# Patient Record
Sex: Male | Born: 1940
Health system: Southern US, Community
[De-identification: ages and names within clinical notes are randomized; demographics above are authoritative.]

## PROBLEM LIST (undated history)

## (undated) DIAGNOSIS — G5600 Carpal tunnel syndrome, unspecified upper limb: Secondary | ICD-10-CM

## (undated) DIAGNOSIS — J45909 Unspecified asthma, uncomplicated: Secondary | ICD-10-CM

## (undated) DIAGNOSIS — M1990 Unspecified osteoarthritis, unspecified site: Secondary | ICD-10-CM

## (undated) DIAGNOSIS — R413 Other amnesia: Secondary | ICD-10-CM

## (undated) DIAGNOSIS — K5792 Diverticulitis of intestine, part unspecified, without perforation or abscess without bleeding: Secondary | ICD-10-CM

## (undated) DIAGNOSIS — K219 Gastro-esophageal reflux disease without esophagitis: Secondary | ICD-10-CM

## (undated) DIAGNOSIS — E7800 Pure hypercholesterolemia, unspecified: Secondary | ICD-10-CM

## (undated) DIAGNOSIS — H269 Unspecified cataract: Secondary | ICD-10-CM

## (undated) HISTORY — DX: Gastro-esophageal reflux disease without esophagitis: K21.9

## (undated) HISTORY — DX: Diverticulitis of intestine, part unspecified, without perforation or abscess without bleeding: K57.92

## (undated) HISTORY — PX: EYE SURGERY: SHX253

## (undated) HISTORY — DX: Pure hypercholesterolemia, unspecified: E78.00

## (undated) HISTORY — DX: Carpal tunnel syndrome, unspecified upper limb: G56.00

## (undated) HISTORY — PX: BILATERAL CARPAL TUNNEL RELEASE: SHX6508

## (undated) HISTORY — DX: Unspecified asthma, uncomplicated: J45.909

## (undated) HISTORY — PX: CATARACT EXTRACTION, BILATERAL: SHX1313

## (undated) HISTORY — DX: Unspecified cataract: H26.9

## (undated) HISTORY — DX: Other amnesia: R41.3

---

## 2000-02-06 ENCOUNTER — Encounter: Payer: Self-pay | Admitting: Ophthalmology

## 2000-02-07 ENCOUNTER — Ambulatory Visit (HOSPITAL_COMMUNITY): Admission: RE | Admit: 2000-02-07 | Discharge: 2000-02-08 | Payer: Self-pay | Admitting: Ophthalmology

## 2002-06-14 ENCOUNTER — Encounter: Payer: Self-pay | Admitting: Ophthalmology

## 2002-06-16 ENCOUNTER — Ambulatory Visit (HOSPITAL_COMMUNITY): Admission: RE | Admit: 2002-06-16 | Discharge: 2002-06-16 | Payer: Self-pay | Admitting: Ophthalmology

## 2013-03-25 ENCOUNTER — Ambulatory Visit (INDEPENDENT_AMBULATORY_CARE_PROVIDER_SITE_OTHER): Payer: BC Managed Care – PPO | Admitting: Family Medicine

## 2013-03-25 VITALS — BP 126/78 | HR 69 | Temp 98.0°F | Resp 16 | Ht 70.5 in | Wt 230.0 lb

## 2013-03-25 DIAGNOSIS — J309 Allergic rhinitis, unspecified: Secondary | ICD-10-CM

## 2013-03-25 MED ORDER — AMOXICILLIN 500 MG PO CAPS: 1000.0000 mg | ORAL_CAPSULE | Freq: Two times a day (BID) | ORAL | Status: DC

## 2013-03-25 MED ORDER — FLUTICASONE PROPIONATE 50 MCG/ACT NA SUSP: 2.0000 | Freq: Every day | NASAL | Status: DC

## 2013-03-25 MED ORDER — IPRATROPIUM BROMIDE 0.03 % NA SOLN: 2.0000 | Freq: Two times a day (BID) | NASAL | Status: DC

## 2013-03-25 NOTE — Patient Instructions (Addendum)
1.  Take Mucinex Cold & Sinus as recommended. 2.  Start Flonase and Atrovent nasal sprays as prescribed. 3. If no improvement in 3-5 days (Wednesday), start Amoxicillin.

## 2013-03-25 NOTE — Progress Notes (Signed)
[address]   [place], [place]  [address]   [phone]  Subjective:    Patient ID: Kenneth Harper, male    DOB: [birth year], 72 y.o.   MRN: [id number]  Sore Throat  Associated symptoms include congestion, coughing and trouble swallowing. Pertinent negatives include no diarrhea, ear pain, headaches, shortness of breath, stridor or vomiting.  Otalgia  Associated symptoms include coughing, rhinorrhea and a sore throat. Pertinent negatives include no diarrhea, headaches, rash or vomiting.   This 72 y.o. male presents for evaluation of sore throat, cough. Has chronic ear problems.  Onset three days ago.  Awoke with ST, cough, nasal congestion.  Took Mucinex, cough syrup and taking throat lozenges.  No fever/chills/sweats.  No headache.  No ear pain.  +ST and scratchy throat.  +ear congestion.  +congestion; +rhinorrhea clear.  Scant sneezing.  +PND.  No pain with swallowing.  +coughing sporadically; no SOB.  No sputum production.  No v/d.  PCP:  Dr. Maxie Better.   Review of Systems  Constitutional: Negative for fever, chills, diaphoresis and fatigue.  HENT: Positive for congestion, sore throat, rhinorrhea, trouble swallowing, voice change and postnasal drip. Negative for ear pain, sneezing and sinus pressure.   Respiratory: Positive for cough. Negative for shortness of breath, wheezing and stridor.   Gastrointestinal: Negative for nausea, vomiting and diarrhea.  Skin: Negative for rash.  Neurological: Negative for headaches.   Past Medical History  Diagnosis Date  . Asthma   . Cataract    Past Surgical History  Procedure Laterality Date  . Eye surgery     No Known Allergies No current outpatient prescriptions on file prior to visit.   No current facility-administered medications on file prior to visit.       Objective:   Physical Exam  Nursing note and vitals reviewed. Constitutional: He is oriented to person, place, and time. He appears well-developed and  well-nourished. No distress.  HENT:  Head: Normocephalic and atraumatic.  Right Ear: External ear normal.  Left Ear: External ear normal.  Nose: Nose normal.  Mouth/Throat: Posterior oropharyngeal erythema present. No oropharyngeal exudate or posterior oropharyngeal edema.  Eyes: Conjunctivae and EOM are normal. Pupils are equal, round, and reactive to light.  Neck: Normal range of motion. Neck supple.  Cardiovascular: Normal rate, regular rhythm, normal heart sounds and intact distal pulses.   Pulmonary/Chest: Effort normal and breath sounds normal. He has no wheezes. He has no rales.  Lymphadenopathy:    He has no cervical adenopathy.  Neurological: He is alert and oriented to person, place, and time.  Skin: Skin is warm and dry. No rash noted. He is not diaphoretic.  Psychiatric: He has a normal mood and affect. His behavior is normal.      Assessment & Plan:  Allergic rhinitis, cause unspecified - Plan: fluticasone (FLONASE) 50 MCG/ACT nasal spray, ipratropium (ATROVENT) 0.03 % nasal spray  1. Allergic Rhinitis: uncontrolled; rx for Flonase and Atrovent nasal sprays.  If no improvement in 3-5 days, start Amoxicillin.  Meds ordered this encounter  Medications  . atorvastatin (LIPITOR) 20 MG tablet    Sig: Take 20 mg by mouth daily.  . fluticasone (FLONASE) 50 MCG/ACT nasal spray    Sig: Place 2 sprays into the nose daily.    Dispense:  16 g    Refill:  6  . ipratropium (ATROVENT) 0.03 % nasal spray    Sig: Place 2 sprays into the nose 2 (two) times daily.    Dispense:  30  mL    Refill:  5  . amoxicillin (AMOXIL) 500 MG capsule    Sig: Take 2 capsules (1,000 mg total) by mouth 2 (two) times daily.    Dispense:  40 capsule    Refill:  0

## 2013-10-30 ENCOUNTER — Ambulatory Visit (INDEPENDENT_AMBULATORY_CARE_PROVIDER_SITE_OTHER): Payer: BC Managed Care – PPO | Admitting: Family Medicine

## 2013-10-30 VITALS — BP 128/80 | HR 88 | Temp 98.0°F | Resp 17 | Ht 68.5 in | Wt 230.0 lb

## 2013-10-30 DIAGNOSIS — J069 Acute upper respiratory infection, unspecified: Secondary | ICD-10-CM

## 2013-10-30 DIAGNOSIS — J309 Allergic rhinitis, unspecified: Secondary | ICD-10-CM

## 2013-10-30 MED ORDER — AMOXICILLIN 875 MG PO TABS: 875.0000 mg | ORAL_TABLET | Freq: Two times a day (BID) | ORAL | Status: DC

## 2013-10-30 MED ORDER — HYDROCOD POLST-CHLORPHEN POLST 10-8 MG/5ML PO LQCR: 5.0000 mL | Freq: Every evening | ORAL | Status: DC | PRN

## 2013-10-30 NOTE — Progress Notes (Signed)
   Subjective:    Patient ID: Kenneth Harper, male    DOB: 1941-06-12, 73 y.o.   MRN: [id number]  HPI Patient presents today with off and on symptoms of cough, sore throat for 6 weeks. Currently having a tickle in his throat, cough with yellow/green sputum. Has ear congestion, clear nasal drainage. Was seen 9/14 with similar symptoms and was started on flonase. He used it for awhile with good results and discontinued use.  Does not feel unwell, tired of persistent symptoms.   Review of Systems No fever, no headache, no chest pain, no SOB (had some this morning while carrying heavy trash bags to curb).  No nausea, no vomiting, no diarrhea.     Objective:   Physical Exam  Vitals reviewed. Constitutional: He is oriented to person, place, and time. He appears well-developed and well-nourished. No distress.  HENT:  Head: Normocephalic and atraumatic.  Right Ear: Tympanic membrane, external ear and ear canal normal.  Left Ear: Tympanic membrane, external ear and ear canal normal.  Nose: Mucosal edema and rhinorrhea present. Right sinus exhibits no maxillary sinus tenderness and no frontal sinus tenderness. Left sinus exhibits no maxillary sinus tenderness and no frontal sinus tenderness.  Mouth/Throat: Oropharyngeal exudate and posterior oropharyngeal erythema present. No posterior oropharyngeal edema.  Cardiovascular: Normal rate, regular rhythm and normal heart sounds.   Pulmonary/Chest: Effort normal and breath sounds normal.  Musculoskeletal: Normal range of motion.  Neurological: He is alert and oriented to person, place, and time.  Skin: Skin is warm and dry. He is not diaphoretic.  Psychiatric: He has a normal mood and affect. His behavior is normal. Judgment and thought content normal.      Assessment & Plan:  1. Acute upper respiratory infections of unspecified site -Suspect viral/allergic rhinitis, but with prolonged course, will cover with antibiotic - amoxicillin (AMOXIL) 875  MG tablet; Take 1 tablet (875 mg total) by mouth 2 (two) times daily.  Dispense: 20 tablet; Refill: 0 - chlorpheniramine-HYDROcodone (TUSSIONEX PENNKINETIC ER) 10-8 MG/5ML LQCR; Take 5 mLs by mouth at bedtime as needed for cough (cough).  Dispense: 70 mL; Refill: 0  2- Allergic Rhinitis -Provided written and verbal instructions including the following: Take non-sedating antihistamine every day while pollen heavy (Claritin, Zyrtec or Allegra- generic fine) Restart Flonase nasal spray, 2 sprays in each nostril every day- consider starting this again in the fall Take prescription cough syrup at bedtime as needed (ONLY 1 teaspoon) Can take Delsym over the counter cough syrup as needed during the day. Follow up if symptoms worsen, you run fever over 101 for more than 24 hours, you have chest pain, shortness of breath or no improvement in 3-5 days.  Elby Beck, FNP-BC  [facility]  10/30/2013 11:23 AM

## 2013-10-30 NOTE — Patient Instructions (Signed)
Take non-sedating antihistamine every day while pollen heavy (Claritin, Zyrtec or Allegra- generic fine) Restart Flonase nasal spray, 2 sprays in each nostril every day- consider starting this again in the fall Take prescription cough syrup at bedtime as needed (ONLY 1 teaspoon) Can take Delsym over the counter cough syrup as needed during the day. Follow up if symptoms worsen, you run fever over 101 for more than 24 hours, you have chest pain, shortness of breath or no improvement in 3-5 days.  Allergic Rhinitis Allergic rhinitis is when the mucous membranes in the nose respond to allergens. Allergens are particles in the air that cause your body to have an allergic reaction. This causes you to release allergic antibodies. Through a chain of events, these eventually cause you to release histamine into the blood stream. Although meant to protect the body, it is this release of histamine that causes your discomfort, such as frequent sneezing, congestion, and an itchy, runny nose.  CAUSES  Seasonal allergic rhinitis (hay fever) is caused by pollen allergens that may come from grasses, trees, and weeds. Year-round allergic rhinitis (perennial allergic rhinitis) is caused by allergens such as house dust mites, pet dander, and mold spores.  SYMPTOMS   Nasal stuffiness (congestion).  Itchy, runny nose with sneezing and tearing of the eyes. DIAGNOSIS  Your health care provider can help you determine the allergen or allergens that trigger your symptoms. If you and your health care provider are unable to determine the allergen, skin or blood testing may be used. TREATMENT  Allergic Rhinitis does not have a cure, but it can be controlled by:  Medicines and allergy shots (immunotherapy).  Avoiding the allergen. Hay fever may often be treated with antihistamines in pill or nasal spray forms. Antihistamines block the effects of histamine. There are over-the-counter medicines that may help with nasal  congestion and swelling around the eyes. Check with your health care provider before taking or giving this medicine.  If avoiding the allergen or the medicine prescribed do not work, there are many new medicines your health care provider can prescribe. Stronger medicine may be used if initial measures are ineffective. Desensitizing injections can be used if medicine and avoidance does not work. Desensitization is when a patient is given ongoing shots until the body becomes less sensitive to the allergen. Make sure you follow up with your health care provider if problems continue. HOME CARE INSTRUCTIONS It is not possible to completely avoid allergens, but you can reduce your symptoms by taking steps to limit your exposure to them. It helps to know exactly what you are allergic to so that you can avoid your specific triggers. SEEK MEDICAL CARE IF:   You have a fever.  You develop a cough that does not stop easily (persistent).  You have shortness of breath.  You start wheezing.  Symptoms interfere with normal daily activities. Document Released: 03/24/2001 Document Revised: 04/19/2013 Document Reviewed: 03/06/2013 [facility] Patient Information 2014 [place].

## 2013-10-31 NOTE — Progress Notes (Signed)
I have discussed this case with Ms. Gessner, NP and agree.  

## 2014-03-21 ENCOUNTER — Ambulatory Visit: Payer: BC Managed Care – PPO | Attending: Orthopaedic Surgery

## 2014-03-21 DIAGNOSIS — R5381 Other malaise: Secondary | ICD-10-CM | POA: Insufficient documentation

## 2014-03-21 DIAGNOSIS — IMO0001 Reserved for inherently not codable concepts without codable children: Secondary | ICD-10-CM | POA: Diagnosis present

## 2014-03-21 DIAGNOSIS — M545 Low back pain, unspecified: Secondary | ICD-10-CM | POA: Diagnosis not present

## 2014-03-21 DIAGNOSIS — M25569 Pain in unspecified knee: Secondary | ICD-10-CM | POA: Insufficient documentation

## 2014-03-21 DIAGNOSIS — M1710 Unilateral primary osteoarthritis, unspecified knee: Secondary | ICD-10-CM | POA: Diagnosis not present

## 2014-03-23 ENCOUNTER — Ambulatory Visit: Payer: BC Managed Care – PPO | Admitting: Physical Therapy

## 2014-03-23 DIAGNOSIS — IMO0001 Reserved for inherently not codable concepts without codable children: Secondary | ICD-10-CM | POA: Diagnosis not present

## 2014-03-27 ENCOUNTER — Ambulatory Visit: Payer: BC Managed Care – PPO | Admitting: Physical Therapy

## 2014-03-30 ENCOUNTER — Ambulatory Visit: Payer: BC Managed Care – PPO | Admitting: Physical Therapy

## 2014-04-02 ENCOUNTER — Ambulatory Visit: Payer: BC Managed Care – PPO | Admitting: Physical Therapy

## 2014-04-02 DIAGNOSIS — IMO0001 Reserved for inherently not codable concepts without codable children: Secondary | ICD-10-CM | POA: Diagnosis not present

## 2014-04-06 ENCOUNTER — Ambulatory Visit: Payer: BC Managed Care – PPO | Admitting: Physical Therapy

## 2014-04-06 DIAGNOSIS — IMO0001 Reserved for inherently not codable concepts without codable children: Secondary | ICD-10-CM | POA: Diagnosis not present

## 2014-04-09 ENCOUNTER — Encounter: Payer: BC Managed Care – PPO | Admitting: Physical Therapy

## 2014-04-13 ENCOUNTER — Ambulatory Visit: Payer: BC Managed Care – PPO | Attending: Orthopaedic Surgery | Admitting: Physical Therapy

## 2014-04-13 DIAGNOSIS — M25562 Pain in left knee: Secondary | ICD-10-CM | POA: Diagnosis not present

## 2014-04-13 DIAGNOSIS — Z5189 Encounter for other specified aftercare: Secondary | ICD-10-CM | POA: Diagnosis present

## 2014-04-13 DIAGNOSIS — R5381 Other malaise: Secondary | ICD-10-CM | POA: Insufficient documentation

## 2014-04-13 DIAGNOSIS — M25561 Pain in right knee: Secondary | ICD-10-CM | POA: Diagnosis not present

## 2014-04-13 DIAGNOSIS — M545 Low back pain: Secondary | ICD-10-CM | POA: Diagnosis not present

## 2014-04-13 DIAGNOSIS — M172 Bilateral post-traumatic osteoarthritis of knee: Secondary | ICD-10-CM | POA: Diagnosis not present

## 2014-04-20 ENCOUNTER — Ambulatory Visit: Payer: BC Managed Care – PPO | Admitting: Physical Therapy

## 2014-04-25 ENCOUNTER — Ambulatory Visit: Payer: BC Managed Care – PPO

## 2014-04-25 DIAGNOSIS — Z5189 Encounter for other specified aftercare: Secondary | ICD-10-CM | POA: Diagnosis not present

## 2014-06-21 ENCOUNTER — Telehealth: Payer: Self-pay | Admitting: Neurology

## 2014-06-21 NOTE — Telephone Encounter (Signed)
Dr. Melvenia Needles provider was notified of pt r/s his NP appt from 06/26/14 to 08/07/14.

## 2014-06-26 ENCOUNTER — Ambulatory Visit: Payer: BC Managed Care – PPO | Admitting: Neurology

## 2014-08-01 ENCOUNTER — Telehealth: Payer: Self-pay | Admitting: Neurology

## 2014-08-01 NOTE — Telephone Encounter (Signed)
Pt resch new patient appt from 08-07-14 to 09-12-14

## 2014-08-07 ENCOUNTER — Ambulatory Visit: Payer: BC Managed Care – PPO | Admitting: Neurology

## 2014-08-09 ENCOUNTER — Other Ambulatory Visit: Payer: Self-pay | Admitting: Gastroenterology

## 2014-09-06 ENCOUNTER — Encounter: Payer: Self-pay | Admitting: *Deleted

## 2014-09-12 ENCOUNTER — Ambulatory Visit: Payer: Self-pay | Admitting: Neurology

## 2014-10-22 ENCOUNTER — Encounter (HOSPITAL_COMMUNITY): Payer: Self-pay | Admitting: *Deleted

## 2014-10-23 ENCOUNTER — Ambulatory Visit: Payer: Self-pay | Admitting: Neurology

## 2014-10-29 ENCOUNTER — Ambulatory Visit (HOSPITAL_COMMUNITY)
Admission: RE | Admit: 2014-10-29 | Discharge: 2014-10-29 | Disposition: A | Discharge status: Home / Self Care | Payer: BLUE CROSS/BLUE SHIELD | Source: Ambulatory Visit | Attending: Gastroenterology | Admitting: Gastroenterology

## 2014-10-29 ENCOUNTER — Ambulatory Visit (HOSPITAL_COMMUNITY): Payer: BLUE CROSS/BLUE SHIELD | Admitting: Certified Registered Nurse Anesthetist

## 2014-10-29 ENCOUNTER — Encounter (HOSPITAL_COMMUNITY)
Admission: RE | Disposition: A | Discharge status: Home / Self Care | Payer: Self-pay | Source: Ambulatory Visit | Attending: Gastroenterology

## 2014-10-29 ENCOUNTER — Encounter (HOSPITAL_COMMUNITY): Payer: Self-pay | Admitting: Gastroenterology

## 2014-10-29 DIAGNOSIS — Z7982 Long term (current) use of aspirin: Secondary | ICD-10-CM | POA: Insufficient documentation

## 2014-10-29 DIAGNOSIS — Z1211 Encounter for screening for malignant neoplasm of colon: Secondary | ICD-10-CM | POA: Diagnosis not present

## 2014-10-29 DIAGNOSIS — Z87891 Personal history of nicotine dependence: Secondary | ICD-10-CM | POA: Insufficient documentation

## 2014-10-29 DIAGNOSIS — K5730 Diverticulosis of large intestine without perforation or abscess without bleeding: Secondary | ICD-10-CM | POA: Insufficient documentation

## 2014-10-29 DIAGNOSIS — J45909 Unspecified asthma, uncomplicated: Secondary | ICD-10-CM | POA: Insufficient documentation

## 2014-10-29 DIAGNOSIS — K621 Rectal polyp: Secondary | ICD-10-CM | POA: Insufficient documentation

## 2014-10-29 DIAGNOSIS — M179 Osteoarthritis of knee, unspecified: Secondary | ICD-10-CM | POA: Diagnosis not present

## 2014-10-29 DIAGNOSIS — D120 Benign neoplasm of cecum: Secondary | ICD-10-CM | POA: Insufficient documentation

## 2014-10-29 DIAGNOSIS — K219 Gastro-esophageal reflux disease without esophagitis: Secondary | ICD-10-CM | POA: Diagnosis not present

## 2014-10-29 DIAGNOSIS — E780 Pure hypercholesterolemia: Secondary | ICD-10-CM | POA: Diagnosis not present

## 2014-10-29 DIAGNOSIS — R413 Other amnesia: Secondary | ICD-10-CM | POA: Insufficient documentation

## 2014-10-29 HISTORY — PX: COLONOSCOPY WITH PROPOFOL: SHX5780

## 2014-10-29 SURGERY — COLONOSCOPY WITH PROPOFOL
Anesthesia: Monitor Anesthesia Care

## 2014-10-29 MED ORDER — LACTATED RINGERS IV SOLN
INTRAVENOUS | Status: DC
  Administered 2014-10-29: 1000 mL via INTRAVENOUS

## 2014-10-29 MED ORDER — LIDOCAINE HCL (CARDIAC) 20 MG/ML IV SOLN
INTRAVENOUS | Status: AC
  Filled 2014-10-29: qty 5

## 2014-10-29 MED ORDER — ONDANSETRON HCL 4 MG/2ML IJ SOLN
INTRAMUSCULAR | Status: DC | PRN
  Administered 2014-10-29: 4 mg via INTRAVENOUS

## 2014-10-29 MED ORDER — LIDOCAINE HCL (CARDIAC) 20 MG/ML IV SOLN
INTRAVENOUS | Status: DC | PRN
  Administered 2014-10-29: 50 mg via INTRAVENOUS

## 2014-10-29 MED ORDER — ONDANSETRON HCL 4 MG/2ML IJ SOLN
INTRAMUSCULAR | Status: AC
  Filled 2014-10-29: qty 2

## 2014-10-29 MED ORDER — PROPOFOL 10 MG/ML IV BOLUS
INTRAVENOUS | Status: AC
  Filled 2014-10-29: qty 20

## 2014-10-29 MED ORDER — PROPOFOL INFUSION 10 MG/ML OPTIME
INTRAVENOUS | Status: DC | PRN
  Administered 2014-10-29: 160 ug/kg/min via INTRAVENOUS

## 2014-10-29 MED ORDER — PROPOFOL 10 MG/ML IV BOLUS
INTRAVENOUS | Status: DC | PRN
  Administered 2014-10-29 (×2): 20 mg via INTRAVENOUS

## 2014-10-29 MED ORDER — SODIUM CHLORIDE 0.9 % IV SOLN: INTRAVENOUS | Status: DC

## 2014-10-29 SURGICAL SUPPLY — 21 items

## 2014-10-29 NOTE — Anesthesia Preprocedure Evaluation (Signed)
Anesthesia Evaluation  Patient identified by MRN, date of birth, ID band Patient awake    Reviewed: Allergy & Precautions, NPO status , Patient's Chart, lab work & pertinent test results  Airway Mallampati: II  TM Distance: >3 FB Neck ROM: Full    Dental no notable dental hx. (+) Upper Dentures   Pulmonary neg pulmonary ROS, former smoker,  breath sounds clear to auscultation  Pulmonary exam normal       Cardiovascular negative cardio ROS  Rhythm:Regular Rate:Normal     Neuro/Psych negative neurological ROS  negative psych ROS   GI/Hepatic negative GI ROS, Neg liver ROS,   Endo/Other  negative endocrine ROS  Renal/GU negative Renal ROS  negative genitourinary   Musculoskeletal negative musculoskeletal ROS (+)   Abdominal   Peds negative pediatric ROS (+)  Hematology negative hematology ROS (+)   Anesthesia Other Findings   Reproductive/Obstetrics negative OB ROS                             Anesthesia Physical Anesthesia Plan  ASA: II  Anesthesia Plan: MAC   Post-op Pain Management:    Induction:   Airway Management Planned: Simple Face Mask  Additional Equipment:   Intra-op Plan:   Post-operative Plan:   Informed Consent: I have reviewed the patients History and Physical, chart, labs and discussed the procedure including the risks, benefits and alternatives for the proposed anesthesia with the patient or authorized representative who has indicated his/her understanding and acceptance.   Dental advisory given  Plan Discussed with: CRNA  Anesthesia Plan Comments:         Anesthesia Quick Evaluation

## 2014-10-29 NOTE — Anesthesia Postprocedure Evaluation (Signed)
  Anesthesia Post-op Note  Patient: Kenneth Harper  Procedure(s) Performed: Procedure(s) (LRB): COLONOSCOPY WITH PROPOFOL (N/A)  Patient Location: PACU  Anesthesia Type: MAC  Level of Consciousness: awake and alert   Airway and Oxygen Therapy: Patient Spontanous Breathing  Post-op Pain: mild  Post-op Assessment: Post-op Vital signs reviewed, Patient's Cardiovascular Status Stable, Respiratory Function Stable, Patent Airway and No signs of Nausea or vomiting  Last Vitals:  Filed Vitals:   10/29/14 0908  BP:   Pulse: 71  Temp:   Resp:     Post-op Vital Signs: stable   Complications: No apparent anesthesia complications \

## 2014-10-29 NOTE — Transfer of Care (Signed)
Immediate Anesthesia Transfer of Care Note  Patient: Kenneth Harper  Procedure(s) Performed: Procedure(s): COLONOSCOPY WITH PROPOFOL (N/A)  Patient Location: endoscopy recovery  Anesthesia Type:MAC  Level of Consciousness:  awake, patient cooperative and responds to stimulation  Airway & Oxygen Therapy:Patient Spontanous Breathing and Patient connected to face mask oxgen  Post-op Assessment:  Report given to PACU RN and Post -op Vital signs reviewed and stable  Post vital signs:  Reviewed and stable  Last Vitals:  Filed Vitals:   10/29/14 0701  BP: 152/72  Pulse: 89  Temp: 36.7 C  Resp: 27    Complications: No apparent anesthesia complications

## 2014-10-29 NOTE — Op Note (Signed)
Procedure: Surveillance colonoscopy. History of adenomatous colon polyps removed colonoscopically in the past  Endoscopist: Earle Gell  Premedication: Propofol administered by anesthesia  Procedure: The patient was placed in the left lateral decubitus position. Anal inspection and digital rectal exam were normal. The Pentax pediatric colonoscope was introduced into the rectum and advanced to the cecum. A normal-appearing appendiceal orifice was identified. A normal-appearing ileocecal valve was identified. Colonic preparation for the exam today was good. Withdrawal time was 19 minutes  Rectum. From the mid rectum, a 6 mm sessile polyp was removed with the electrocautery snare. Retroflexed view of the distal rectum was normal.  Sigmoid colon and descending colon. Left colonic diverticulosis  Splenic flexure. Normal  Transverse colon. Normal  Hepatic flexure. Normal  Ascending colon. Normal  Cecum and ileocecal valve. From the proximal cecum, a 1 cm sessile polyp was removed in piecemeal fashion with the hot and cold snare.  Assessment:  #1. Left colonic diverticulosis  #2. A 5 mm sessile rectal polyp was removed with the hot snare  #3. A 1 cm cecal sessile polyp was removed in piecemeal fashion with the hot and cold snare  Recommendation: I will review the polyp pathology to determine if the patient should undergo a repeat surveillance colonoscopy

## 2014-10-29 NOTE — Discharge Instructions (Signed)
Colonoscopy, Care After °These instructions give you information on caring for yourself after your procedure. Your doctor may also give you more specific instructions. Call your doctor if you have any problems or questions after your procedure. °HOME CARE °· Do not drive for 24 hours. °· Do not sign important papers or use machinery for 24 hours. °· You may shower. °· You may go back to your usual activities, but go slower for the first 24 hours. °· Take rest breaks often during the first 24 hours. °· Walk around or use warm packs on your belly (abdomen) if you have belly cramping or gas. °· Drink enough fluids to keep your pee (urine) clear or pale yellow. °· Resume your normal diet. Avoid heavy or fried foods. °· Avoid drinking alcohol for 24 hours or as told by your doctor. °· Only take medicines as told by your doctor. °If a tissue sample (biopsy) was taken during the procedure:  °· Do not take aspirin or blood thinners for 7 days, or as told by your doctor. °· Do not drink alcohol for 7 days, or as told by your doctor. °· Eat soft foods for the first 24 hours. °GET HELP IF: °You still have a small amount of blood in your poop (stool) 2-3 days after the procedure. °GET HELP RIGHT AWAY IF: °· You have more than a small amount of blood in your poop. °· You see clumps of tissue (blood clots) in your poop. °· Your belly is puffy (swollen). °· You feel sick to your stomach (nauseous) or throw up (vomit). °· You have a fever. °· You have belly pain that gets worse and medicine does not help. °MAKE SURE YOU: °· Understand these instructions. °· Will watch your condition. °· Will get help right away if you are not doing well or get worse. °Document Released: 08/01/2010 Document Revised: 07/04/2013 Document Reviewed: 03/06/2013 °Exit[employer]® Patient Information ©2015 [employer]. This information is not intended to replace advice given to you by your health care provider. Make sure you discuss any questions you have with  your health care provider. ° °

## 2014-10-29 NOTE — H&P (Signed)
  Procedure: Surveillance colonoscopy. 01/01/2009 normal surveillance colonoscopy. History of adenomatous colon polyps removed colonoscopically in the past.  History: The patient is a 74 year old male born 13-May-1941. He is scheduled to undergo a surveillance colonoscopy today.  Past medical history: Bilateral lens implants. Hypercholesterolemia. Respiratory allergies. Gastroesophageal reflux. Microscopic hematuria. Osteoarthritis of the knee.  Exam: The patient is alert and lying comfortably on the endoscopy stretcher. Abdomen is soft and nontender to palpation. Cardiac exam reveals a regular rhythm. Lungs are clear to auscultation.  Plan: Proceed with surveillance colonoscopy

## 2014-10-30 ENCOUNTER — Encounter (HOSPITAL_COMMUNITY): Payer: Self-pay | Admitting: Gastroenterology

## 2014-11-28 ENCOUNTER — Telehealth: Payer: Self-pay | Admitting: Neurology

## 2014-11-28 NOTE — Telephone Encounter (Signed)
Pt  Canceled his new patient appt  For 12-02-14 and will call back to resch and notified the referring Dr

## 2014-12-03 ENCOUNTER — Ambulatory Visit: Payer: Self-pay | Admitting: Neurology

## 2015-08-08 ENCOUNTER — Other Ambulatory Visit: Payer: Self-pay | Admitting: Orthopaedic Surgery

## 2015-08-08 DIAGNOSIS — M5136 Other intervertebral disc degeneration, lumbar region: Secondary | ICD-10-CM

## 2015-08-08 DIAGNOSIS — M47816 Spondylosis without myelopathy or radiculopathy, lumbar region: Secondary | ICD-10-CM

## 2015-08-11 ENCOUNTER — Ambulatory Visit
Admission: RE | Admit: 2015-08-11 | Discharge: 2015-08-11 | Disposition: A | Discharge status: Home / Self Care | Payer: BLUE CROSS/BLUE SHIELD | Source: Ambulatory Visit | Attending: Orthopaedic Surgery | Admitting: Orthopaedic Surgery

## 2015-08-11 DIAGNOSIS — M5136 Other intervertebral disc degeneration, lumbar region: Secondary | ICD-10-CM

## 2015-08-11 DIAGNOSIS — M4806 Spinal stenosis, lumbar region: Secondary | ICD-10-CM | POA: Diagnosis not present

## 2015-08-11 DIAGNOSIS — M47816 Spondylosis without myelopathy or radiculopathy, lumbar region: Secondary | ICD-10-CM

## 2015-09-24 NOTE — Pre-Procedure Instructions (Signed)
SEYMOUR BENNETTS  09/24/2015      [id number] - [place], [place] [address] Phone: [phone] Fax: [phone]    Your procedure is scheduled on Tues, Mar 21 @ 1:00 PM  Report to [facility] Admitting at 11:00 AM  Call this number if you have problems the morning of surgery:  [phone]   Remember:  Do not eat food or drink liquids after midnight.  Take these medicines the morning of surgery with A SIP OF WATER Pain Pill(if needed)              Stop taking your Fish Oil. No Goody's,BC's,Aleve,Aspirin,Ibuprofen,Advil,Motrin,or any Herbal Medications.    Do not wear jewelry.  Do not wear lotions, powders, or colognes.  You may wear deodorant.  Men may shave face and neck.  Do not bring valuables to the hospital.  [facility] is not responsible for any belongings or valuables.  Contacts, dentures or bridgework may not be worn into surgery.  Leave your suitcase in the car.  After surgery it may be brought to your room.  For patients admitted to the hospital, discharge time will be determined by your treatment team.  Patients discharged the day of surgery will not be allowed to drive home.    Special instructions:  [place] - Preparing for Surgery  Before surgery, you can play an important role.  Because skin is not sterile, your skin needs to be as free of germs as possible.  You can reduce the number of germs on you skin by washing with CHG (chlorahexidine gluconate) soap before surgery.  CHG is an antiseptic cleaner which kills germs and bonds with the skin to continue killing germs even after washing.  Please DO NOT use if you have an allergy to CHG or antibacterial soaps.  If your skin becomes reddened/irritated stop using the CHG and inform your nurse when you arrive at Short Stay.  Do not shave (including legs and underarms) for at least 48 hours prior to the first CHG shower.   You may shave your face.  Please follow these instructions carefully:   1.  Shower with CHG Soap the night before surgery and the                                morning of Surgery.  2.  If you choose to wash your hair, wash your hair first as usual with your       normal shampoo.  3.  After you shampoo, rinse your hair and body thoroughly to remove the                      Shampoo.  4.  Use CHG as you would any other liquid soap.  You can apply chg directly       to the skin and wash gently with scrungie or a clean washcloth.  5.  Apply the CHG Soap to your body ONLY FROM THE NECK DOWN.        Do not use on open wounds or open sores.  Avoid contact with your eyes,       ears, mouth and genitals (private parts).  Wash genitals (private parts)       with your normal soap.  6.  Wash thoroughly, paying special attention to the area where your surgery  will be performed.  7.  Thoroughly rinse your body with warm water from the neck down.  8.  DO NOT shower/wash with your normal soap after using and rinsing off       the CHG Soap.  9.  Pat yourself dry with a clean towel.            10.  Wear clean pajamas.            11.  Place clean sheets on your bed the night of your first shower and do not        sleep with pets.  Day of Surgery  Do not apply any lotions/deoderants the morning of surgery.  Please wear clean clothes to the hospital/surgery center.    Please read over the following fact sheets that you were given. Pain Booklet, Coughing and Deep Breathing, MRSA Information and Surgical Site Infection Prevention

## 2015-09-25 ENCOUNTER — Encounter (HOSPITAL_COMMUNITY): Payer: Self-pay

## 2015-09-25 ENCOUNTER — Ambulatory Visit (HOSPITAL_COMMUNITY)
Admission: RE | Admit: 2015-09-25 | Discharge: 2015-09-25 | Disposition: A | Discharge status: Home / Self Care | Payer: BLUE CROSS/BLUE SHIELD | Source: Ambulatory Visit | Attending: Surgery | Admitting: Surgery

## 2015-09-25 ENCOUNTER — Encounter (HOSPITAL_COMMUNITY)
Admission: RE | Admit: 2015-09-25 | Discharge: 2015-09-25 | Disposition: A | Discharge status: Home / Self Care | Payer: BLUE CROSS/BLUE SHIELD | Source: Ambulatory Visit | Attending: Specialist | Admitting: Specialist

## 2015-09-25 DIAGNOSIS — Z01812 Encounter for preprocedural laboratory examination: Secondary | ICD-10-CM | POA: Diagnosis not present

## 2015-09-25 DIAGNOSIS — Z01818 Encounter for other preprocedural examination: Secondary | ICD-10-CM | POA: Diagnosis not present

## 2015-09-25 DIAGNOSIS — Z01810 Encounter for preprocedural cardiovascular examination: Secondary | ICD-10-CM | POA: Diagnosis not present

## 2015-09-25 HISTORY — DX: Unspecified osteoarthritis, unspecified site: M19.90

## 2015-09-25 LAB — PROTIME-INR
INR: 1.05 (ref 0.00–1.49)
Prothrombin Time: 13.9 seconds (ref 11.6–15.2)

## 2015-09-25 LAB — CBC
HEMATOCRIT: 39.5 % (ref 39.0–52.0)
Hemoglobin: 12.4 g/dL — ABNORMAL LOW (ref 13.0–17.0)
MCH: 23.9 pg — ABNORMAL LOW (ref 26.0–34.0)
MCHC: 31.4 g/dL (ref 30.0–36.0)
MCV: 76.3 fL — ABNORMAL LOW (ref 78.0–100.0)
Platelets: 201 10*3/uL (ref 150–400)
RBC: 5.18 MIL/uL (ref 4.22–5.81)
RDW: 18.2 % — AB (ref 11.5–15.5)
WBC: 5.4 10*3/uL (ref 4.0–10.5)

## 2015-09-25 LAB — SURGICAL PCR SCREEN
MRSA, PCR: NEGATIVE
Staphylococcus aureus: POSITIVE — AB

## 2015-09-25 LAB — URINALYSIS, ROUTINE W REFLEX MICROSCOPIC
Bilirubin Urine: NEGATIVE
Glucose, UA: NEGATIVE mg/dL
Hgb urine dipstick: NEGATIVE
Ketones, ur: NEGATIVE mg/dL
LEUKOCYTES UA: NEGATIVE
NITRITE: NEGATIVE
PH: 7 (ref 5.0–8.0)
Protein, ur: NEGATIVE mg/dL
SPECIFIC GRAVITY, URINE: 1.004 — AB (ref 1.005–1.030)

## 2015-09-25 LAB — COMPREHENSIVE METABOLIC PANEL
ALBUMIN: 3.8 g/dL (ref 3.5–5.0)
ALT: 14 U/L — ABNORMAL LOW (ref 17–63)
AST: 21 U/L (ref 15–41)
Alkaline Phosphatase: 76 U/L (ref 38–126)
Anion gap: 9 (ref 5–15)
BILIRUBIN TOTAL: 0.7 mg/dL (ref 0.3–1.2)
BUN: 8 mg/dL (ref 6–20)
CHLORIDE: 107 mmol/L (ref 101–111)
CO2: 24 mmol/L (ref 22–32)
Calcium: 9.3 mg/dL (ref 8.9–10.3)
Creatinine, Ser: 0.98 mg/dL (ref 0.61–1.24)
GFR calc Af Amer: >60 mL/min (ref >60)
GFR calc non Af Amer: >60 mL/min (ref >60)
GLUCOSE: 112 mg/dL — AB (ref 65–99)
POTASSIUM: 3.9 mmol/L (ref 3.5–5.1)
Sodium: 140 mmol/L (ref 135–145)
TOTAL PROTEIN: 6.6 g/dL (ref 6.5–8.1)

## 2015-09-25 LAB — APTT: APTT: 31 s (ref 24–37)

## 2015-09-25 NOTE — Progress Notes (Signed)
Pt denies cardiac history, chest pain or sob. 

## 2015-09-25 NOTE — Pre-Procedure Instructions (Signed)
Kenneth Harper  09/25/2015      Your procedure is scheduled on Tuesday, October 01, 2015 at 1:00 PM.   [facility] Admitting Office at 11:00 AM.   Call this number if you have problems the morning of surgery: [phone]                Any questions prior to day of surgery, please call [phone] between 8 & 4 PM.     Do not eat food or drink liquids after midnight Monday, 09/30/15.  Take these medicines the morning of surgery with A SIP OF WATER: Oxycodone (Percocet) or Tylenol - if needed  Stop Aspirin, Multivitamins and Fish Oil as of today.   Do not wear jewelry.  Do not wear lotions, powders, or cologne.  You may wear deodorant.  Men may shave face and neck.  Do not bring valuables to the hospital.  [facility] is not responsible for any belongings or valuables.  Contacts, dentures or bridgework may not be worn into surgery.  Leave your suitcase in the car.  After surgery it may be brought to your room.  For patients admitted to the hospital, discharge time will be determined by your treatment team.  Patients discharged the day of surgery will not be allowed to drive home.    Special instructions:  [facility] [place] [facility] - Preparing for Surgery  Before surgery, you can play an important role.  Because skin is not sterile, your skin needs to be as free of germs as possible.  You can reduce the number of germs on you skin by washing with CHG (chlorahexidine gluconate) soap before surgery.  CHG is an antiseptic cleaner which kills germs and bonds with the skin to continue killing germs even after washing.  Please DO NOT use if you have an allergy to CHG or antibacterial soaps.  If your skin becomes reddened/irritated stop using the CHG and inform your nurse when you arrive at Short Stay.  Do not shave (including legs and underarms) for at least 48 hours prior to the first CHG shower.  You may shave your face.  Please follow these instructions  carefully:   1.  Shower with CHG Soap the night before surgery and the                                morning of Surgery.  2.  If you choose to wash your hair, wash your hair first as usual with your       normal shampoo.  3.  After you shampoo, rinse your hair and body thoroughly to remove the                      Shampoo.  4.  Use CHG as you would any other liquid soap.  You can apply chg directly       to the skin and wash gently with scrungie or a clean washcloth.  5.  Apply the CHG Soap to your body ONLY FROM THE NECK DOWN.        Do not use on open wounds or open sores.  Avoid contact with your eyes, ears, mouth and genitals (private parts).  Wash genitals (private parts) with your normal soap.  6.  Wash thoroughly, paying special attention to the area where your surgery        will be performed.  7.  Thoroughly rinse your body with warm water from the neck down.  8.  DO NOT shower/wash with your normal soap after using and rinsing off       the CHG Soap.  9.  Pat yourself dry with a clean towel.            10.  Wear clean pajamas.            11.  Place clean sheets on your bed the night of your first shower and do not        sleep with pets.  Day of Surgery  Do not apply any lotions the morning of surgery.  Please wear clean clothes to the hospital.   Please read over the following fact sheets that you were given. Pain Booklet, Coughing and Deep Breathing, MRSA Information and Surgical Site Infection Prevention

## 2015-09-25 NOTE — Progress Notes (Signed)
Mupirocin Ointment called into [facility] at [facility] for positive PCR of Staph. Left message on pt's voicemail informing him of results and need to pick up Rx.

## 2015-09-30 DIAGNOSIS — M5116 Intervertebral disc disorders with radiculopathy, lumbar region: Secondary | ICD-10-CM | POA: Diagnosis present

## 2015-09-30 MED ORDER — CEFAZOLIN SODIUM-DEXTROSE 2-3 GM-% IV SOLR
2.0000 g | INTRAVENOUS | Status: AC
  Administered 2015-10-01: 2 g via INTRAVENOUS
  Filled 2015-09-30: qty 50

## 2015-09-30 NOTE — Anesthesia Preprocedure Evaluation (Addendum)
Anesthesia Evaluation  Patient identified by MRN, date of birth, ID band Patient awake    Reviewed: Allergy & Precautions, H&P , NPO status , Patient's Chart, lab work & pertinent test results  Airway Mallampati: II  TM Distance: >3 FB Neck ROM: Full    Dental no notable dental hx. (+) Teeth Intact, Dental Advisory Given   Pulmonary asthma , former smoker,    Pulmonary exam normal breath sounds clear to auscultation       Cardiovascular negative cardio ROS   Rhythm:Regular Rate:Normal     Neuro/Psych negative neurological ROS  negative psych ROS   GI/Hepatic negative GI ROS, Neg liver ROS,   Endo/Other  negative endocrine ROS  Renal/GU negative Renal ROS  negative genitourinary   Musculoskeletal  (+) Arthritis , Osteoarthritis,    Abdominal   Peds  Hematology negative hematology ROS (+)   Anesthesia Other Findings   Reproductive/Obstetrics negative OB ROS                            Anesthesia Physical Anesthesia Plan  ASA: II  Anesthesia Plan: General   Post-op Pain Management:    Induction: Intravenous  Airway Management Planned: Oral ETT  Additional Equipment:   Intra-op Plan:   Post-operative Plan: Extubation in OR  Informed Consent: I have reviewed the patients History and Physical, chart, labs and discussed the procedure including the risks, benefits and alternatives for the proposed anesthesia with the patient or authorized representative who has indicated his/her understanding and acceptance.   Dental advisory given  Plan Discussed with: CRNA  Anesthesia Plan Comments:         Anesthesia Quick Evaluation

## 2015-10-01 ENCOUNTER — Ambulatory Visit (HOSPITAL_COMMUNITY): Payer: BLUE CROSS/BLUE SHIELD

## 2015-10-01 ENCOUNTER — Encounter (HOSPITAL_COMMUNITY): Payer: Self-pay | Admitting: Certified Registered Nurse Anesthetist

## 2015-10-01 ENCOUNTER — Ambulatory Visit (HOSPITAL_COMMUNITY): Payer: BLUE CROSS/BLUE SHIELD | Admitting: Certified Registered Nurse Anesthetist

## 2015-10-01 ENCOUNTER — Encounter (HOSPITAL_COMMUNITY)
Admission: RE | Disposition: A | Discharge status: Home / Self Care | Payer: Self-pay | Source: Ambulatory Visit | Attending: Specialist

## 2015-10-01 ENCOUNTER — Ambulatory Visit (HOSPITAL_COMMUNITY)
Admission: RE | Admit: 2015-10-01 | Discharge: 2015-10-02 | Disposition: A | Discharge status: Home / Self Care | Payer: BLUE CROSS/BLUE SHIELD | Source: Ambulatory Visit | Attending: Specialist | Admitting: Specialist

## 2015-10-01 DIAGNOSIS — J45909 Unspecified asthma, uncomplicated: Secondary | ICD-10-CM | POA: Insufficient documentation

## 2015-10-01 DIAGNOSIS — R001 Bradycardia, unspecified: Secondary | ICD-10-CM | POA: Diagnosis not present

## 2015-10-01 DIAGNOSIS — Z87891 Personal history of nicotine dependence: Secondary | ICD-10-CM | POA: Insufficient documentation

## 2015-10-01 DIAGNOSIS — M5126 Other intervertebral disc displacement, lumbar region: Secondary | ICD-10-CM | POA: Diagnosis present

## 2015-10-01 DIAGNOSIS — M5116 Intervertebral disc disorders with radiculopathy, lumbar region: Secondary | ICD-10-CM | POA: Diagnosis present

## 2015-10-01 DIAGNOSIS — M5137 Other intervertebral disc degeneration, lumbosacral region: Secondary | ICD-10-CM | POA: Insufficient documentation

## 2015-10-01 DIAGNOSIS — M2578 Osteophyte, vertebrae: Secondary | ICD-10-CM | POA: Insufficient documentation

## 2015-10-01 DIAGNOSIS — M1389 Other specified arthritis, multiple sites: Secondary | ICD-10-CM | POA: Diagnosis not present

## 2015-10-01 HISTORY — PX: LUMBAR LAMINECTOMY/DECOMPRESSION MICRODISCECTOMY: SHX5026

## 2015-10-01 SURGERY — LUMBAR LAMINECTOMY/DECOMPRESSION MICRODISCECTOMY
Anesthesia: General

## 2015-10-01 MED ORDER — DOCUSATE SODIUM 100 MG PO CAPS
100.0000 mg | ORAL_CAPSULE | Freq: Two times a day (BID) | ORAL | Status: DC
  Administered 2015-10-01 – 2015-10-02 (×2): 100 mg via ORAL
  Filled 2015-10-01 (×2): qty 1

## 2015-10-01 MED ORDER — MENTHOL 3 MG MT LOZG: 1.0000 | LOZENGE | OROMUCOSAL | Status: DC | PRN

## 2015-10-01 MED ORDER — LIDOCAINE HCL (CARDIAC) 20 MG/ML IV SOLN
INTRAVENOUS | Status: DC | PRN
  Administered 2015-10-01: 100 mg via INTRAVENOUS

## 2015-10-01 MED ORDER — EPHEDRINE SULFATE 50 MG/ML IJ SOLN
INTRAMUSCULAR | Status: DC | PRN
  Administered 2015-10-01: 5 mg via INTRAVENOUS

## 2015-10-01 MED ORDER — PHENYLEPHRINE HCL 10 MG/ML IJ SOLN
INTRAMUSCULAR | Status: DC | PRN
  Administered 2015-10-01 (×2): 80 ug via INTRAVENOUS

## 2015-10-01 MED ORDER — THROMBIN 20000 UNITS EX SOLR
CUTANEOUS | Status: AC
  Filled 2015-10-01: qty 20000

## 2015-10-01 MED ORDER — ONDANSETRON HCL 4 MG/2ML IJ SOLN
INTRAMUSCULAR | Status: DC | PRN
  Administered 2015-10-01: 4 mg via INTRAVENOUS

## 2015-10-01 MED ORDER — LACTATED RINGERS IV SOLN
INTRAVENOUS | Status: DC | PRN
  Administered 2015-10-01 (×2): via INTRAVENOUS

## 2015-10-01 MED ORDER — METHOCARBAMOL 500 MG PO TABS: 500.0000 mg | ORAL_TABLET | Freq: Three times a day (TID) | ORAL | Status: DC | PRN

## 2015-10-01 MED ORDER — BUPIVACAINE LIPOSOME 1.3 % IJ SUSP
20.0000 mL | Freq: Once | INTRAMUSCULAR | Status: DC
  Filled 2015-10-01: qty 20

## 2015-10-01 MED ORDER — KETOROLAC TROMETHAMINE 30 MG/ML IJ SOLN
30.0000 mg | Freq: Once | INTRAMUSCULAR | Status: AC
  Administered 2015-10-01: 30 mg via INTRAVENOUS
  Filled 2015-10-01: qty 1

## 2015-10-01 MED ORDER — ROCURONIUM BROMIDE 50 MG/5ML IV SOLN
INTRAVENOUS | Status: AC
  Filled 2015-10-01: qty 1

## 2015-10-01 MED ORDER — PROPOFOL 10 MG/ML IV BOLUS
INTRAVENOUS | Status: AC
  Filled 2015-10-01: qty 20

## 2015-10-01 MED ORDER — ACETAMINOPHEN 325 MG PO TABS: 650.0000 mg | ORAL_TABLET | ORAL | Status: DC | PRN

## 2015-10-01 MED ORDER — ONDANSETRON HCL 4 MG/2ML IJ SOLN: 4.0000 mg | INTRAMUSCULAR | Status: DC | PRN

## 2015-10-01 MED ORDER — FENTANYL CITRATE (PF) 100 MCG/2ML IJ SOLN
INTRAMUSCULAR | Status: DC | PRN
  Administered 2015-10-01 (×3): 50 ug via INTRAVENOUS
  Administered 2015-10-01: 100 ug via INTRAVENOUS
  Administered 2015-10-01 (×2): 50 ug via INTRAVENOUS

## 2015-10-01 MED ORDER — DEXAMETHASONE SODIUM PHOSPHATE 10 MG/ML IJ SOLN
INTRAMUSCULAR | Status: DC | PRN
  Administered 2015-10-01: 10 mg via INTRAVENOUS

## 2015-10-01 MED ORDER — VITAMIN D 1000 UNITS PO TABS
1000.0000 [IU] | ORAL_TABLET | Freq: Every day | ORAL | Status: DC
  Administered 2015-10-02: 1000 [IU] via ORAL
  Filled 2015-10-01: qty 1

## 2015-10-01 MED ORDER — OMEGA-3-ACID ETHYL ESTERS 1 G PO CAPS
1.0000 g | ORAL_CAPSULE | Freq: Every day | ORAL | Status: DC
  Administered 2015-10-02: 1 g via ORAL
  Filled 2015-10-01: qty 1

## 2015-10-01 MED ORDER — LORATADINE 10 MG PO TABS
10.0000 mg | ORAL_TABLET | Freq: Every day | ORAL | Status: DC
  Administered 2015-10-02: 10 mg via ORAL
  Filled 2015-10-01: qty 1

## 2015-10-01 MED ORDER — MORPHINE SULFATE (PF) 2 MG/ML IV SOLN: 1.0000 mg | INTRAVENOUS | Status: DC | PRN

## 2015-10-01 MED ORDER — OXYCODONE-ACETAMINOPHEN 10-325 MG PO TABS
1.0000 | ORAL_TABLET | ORAL | Status: DC | PRN
Start: 2015-10-01 — End: 2018-03-24

## 2015-10-01 MED ORDER — POLYETHYLENE GLYCOL 3350 17 G PO PACK: 17.0000 g | PACK | Freq: Every day | ORAL | Status: DC | PRN

## 2015-10-01 MED ORDER — ONDANSETRON HCL 4 MG/2ML IJ SOLN
INTRAMUSCULAR | Status: AC
  Filled 2015-10-01: qty 2

## 2015-10-01 MED ORDER — GABAPENTIN 300 MG PO CAPS
300.0000 mg | ORAL_CAPSULE | Freq: Every day | ORAL | Status: DC
  Administered 2015-10-01: 300 mg via ORAL
  Filled 2015-10-01: qty 1

## 2015-10-01 MED ORDER — CALCIUM CARBONATE-VITAMIN D 500-200 MG-UNIT PO TABS
ORAL_TABLET | Freq: Every day | ORAL | Status: DC
  Administered 2015-10-01: 1 via ORAL
  Filled 2015-10-01: qty 1

## 2015-10-01 MED ORDER — OXYCODONE-ACETAMINOPHEN 5-325 MG PO TABS
1.0000 | ORAL_TABLET | ORAL | Status: DC | PRN
  Administered 2015-10-01: 1 via ORAL
  Filled 2015-10-01: qty 1

## 2015-10-01 MED ORDER — ATORVASTATIN CALCIUM 40 MG PO TABS
40.0000 mg | ORAL_TABLET | Freq: Every day | ORAL | Status: DC
  Administered 2015-10-01: 40 mg via ORAL
  Filled 2015-10-01: qty 1

## 2015-10-01 MED ORDER — BUPIVACAINE HCL 0.5 % IJ SOLN
INTRAMUSCULAR | Status: DC | PRN
  Administered 2015-10-01: 14 mL

## 2015-10-01 MED ORDER — SODIUM CHLORIDE 0.9% FLUSH
3.0000 mL | Freq: Two times a day (BID) | INTRAVENOUS | Status: DC
  Administered 2015-10-01: 3 mL via INTRAVENOUS

## 2015-10-01 MED ORDER — METHOCARBAMOL 500 MG PO TABS: 500.0000 mg | ORAL_TABLET | Freq: Four times a day (QID) | ORAL | Status: DC | PRN

## 2015-10-01 MED ORDER — LIDOCAINE HCL (CARDIAC) 20 MG/ML IV SOLN
INTRAVENOUS | Status: AC
  Filled 2015-10-01: qty 5

## 2015-10-01 MED ORDER — NEOSTIGMINE METHYLSULFATE 10 MG/10ML IV SOLN
INTRAVENOUS | Status: DC | PRN
  Administered 2015-10-01: 3 mg via INTRAVENOUS

## 2015-10-01 MED ORDER — GLYCOPYRROLATE 0.2 MG/ML IJ SOLN
INTRAMUSCULAR | Status: AC
  Filled 2015-10-01: qty 3

## 2015-10-01 MED ORDER — GLYCOPYRROLATE 0.2 MG/ML IJ SOLN
INTRAMUSCULAR | Status: DC | PRN
  Administered 2015-10-01: .2 mg via INTRAVENOUS

## 2015-10-01 MED ORDER — BUPIVACAINE HCL (PF) 0.5 % IJ SOLN
INTRAMUSCULAR | Status: AC
  Filled 2015-10-01: qty 30

## 2015-10-01 MED ORDER — DICLOFENAC SODIUM 1 % TD GEL: 4.0000 g | Freq: Every day | TRANSDERMAL | Status: DC | PRN

## 2015-10-01 MED ORDER — NEOSTIGMINE METHYLSULFATE 10 MG/10ML IV SOLN
INTRAVENOUS | Status: AC
  Filled 2015-10-01: qty 1

## 2015-10-01 MED ORDER — BUPIVACAINE LIPOSOME 1.3 % IJ SUSP
INTRAMUSCULAR | Status: DC | PRN
  Administered 2015-10-01: 14 mL

## 2015-10-01 MED ORDER — EPHEDRINE SULFATE 50 MG/ML IJ SOLN
INTRAMUSCULAR | Status: AC
  Filled 2015-10-01: qty 1

## 2015-10-01 MED ORDER — FLEET ENEMA 7-19 GM/118ML RE ENEM: 1.0000 | ENEMA | Freq: Once | RECTAL | Status: DC | PRN

## 2015-10-01 MED ORDER — ASPIRIN EC 81 MG PO TBEC: 81.0000 mg | DELAYED_RELEASE_TABLET | ORAL | Status: DC

## 2015-10-01 MED ORDER — HYDROCODONE-ACETAMINOPHEN 5-325 MG PO TABS: 1.0000 | ORAL_TABLET | ORAL | Status: DC | PRN

## 2015-10-01 MED ORDER — PROPOFOL 10 MG/ML IV BOLUS
INTRAVENOUS | Status: DC | PRN
  Administered 2015-10-01: 50 mg via INTRAVENOUS
  Administered 2015-10-01: 150 mg via INTRAVENOUS

## 2015-10-01 MED ORDER — ACETAMINOPHEN 650 MG RE SUPP: 650.0000 mg | RECTAL | Status: DC | PRN

## 2015-10-01 MED ORDER — SODIUM CHLORIDE 0.9 % IV SOLN: 250.0000 mL | INTRAVENOUS | Status: DC

## 2015-10-01 MED ORDER — PANTOPRAZOLE SODIUM 40 MG IV SOLR
40.0000 mg | Freq: Every day | INTRAVENOUS | Status: DC
  Administered 2015-10-01: 40 mg via INTRAVENOUS
  Filled 2015-10-01: qty 40

## 2015-10-01 MED ORDER — CHLORHEXIDINE GLUCONATE 4 % EX LIQD: 60.0000 mL | Freq: Once | CUTANEOUS | Status: DC

## 2015-10-01 MED ORDER — HYDROMORPHONE HCL 1 MG/ML IJ SOLN: 0.2500 mg | INTRAMUSCULAR | Status: DC | PRN

## 2015-10-01 MED ORDER — 0.9 % SODIUM CHLORIDE (POUR BTL) OPTIME
TOPICAL | Status: DC | PRN
  Administered 2015-10-01: 1000 mL

## 2015-10-01 MED ORDER — SUCCINYLCHOLINE CHLORIDE 20 MG/ML IJ SOLN
INTRAMUSCULAR | Status: AC
  Filled 2015-10-01: qty 1

## 2015-10-01 MED ORDER — ADULT MULTIVITAMIN W/MINERALS CH
1.0000 | ORAL_TABLET | Freq: Every day | ORAL | Status: DC
  Administered 2015-10-02: 1 via ORAL
  Filled 2015-10-01: qty 1

## 2015-10-01 MED ORDER — METHOCARBAMOL 1000 MG/10ML IJ SOLN: 500.0000 mg | Freq: Four times a day (QID) | INTRAVENOUS | Status: DC | PRN

## 2015-10-01 MED ORDER — PHENOL 1.4 % MT LIQD: 1.0000 | OROMUCOSAL | Status: DC | PRN

## 2015-10-01 MED ORDER — ALUM & MAG HYDROXIDE-SIMETH 200-200-20 MG/5ML PO SUSP: 30.0000 mL | Freq: Four times a day (QID) | ORAL | Status: DC | PRN

## 2015-10-01 MED ORDER — STERILE WATER FOR INJECTION IJ SOLN
INTRAMUSCULAR | Status: AC
  Filled 2015-10-01: qty 10

## 2015-10-01 MED ORDER — ROCURONIUM BROMIDE 100 MG/10ML IV SOLN
INTRAVENOUS | Status: DC | PRN
  Administered 2015-10-01: 50 mg via INTRAVENOUS

## 2015-10-01 MED ORDER — FENTANYL CITRATE (PF) 250 MCG/5ML IJ SOLN
INTRAMUSCULAR | Status: AC
  Filled 2015-10-01: qty 5

## 2015-10-01 MED ORDER — PHENYLEPHRINE 40 MCG/ML (10ML) SYRINGE FOR IV PUSH (FOR BLOOD PRESSURE SUPPORT)
PREFILLED_SYRINGE | INTRAVENOUS | Status: AC
  Filled 2015-10-01: qty 10

## 2015-10-01 MED ORDER — CEFAZOLIN SODIUM 1-5 GM-% IV SOLN
1.0000 g | Freq: Three times a day (TID) | INTRAVENOUS | Status: AC
  Administered 2015-10-01 (×2): 1 g via INTRAVENOUS
  Filled 2015-10-01 (×2): qty 50

## 2015-10-01 MED ORDER — BISACODYL 5 MG PO TBEC: 5.0000 mg | DELAYED_RELEASE_TABLET | Freq: Every day | ORAL | Status: DC | PRN

## 2015-10-01 MED ORDER — ZOLPIDEM TARTRATE 5 MG PO TABS: 5.0000 mg | ORAL_TABLET | Freq: Every evening | ORAL | Status: DC | PRN

## 2015-10-01 MED ORDER — THROMBIN 20000 UNITS EX SOLR
CUTANEOUS | Status: DC | PRN
  Administered 2015-10-01: 08:00:00 via TOPICAL

## 2015-10-01 MED ORDER — SODIUM CHLORIDE 0.9% FLUSH: 3.0000 mL | INTRAVENOUS | Status: DC | PRN

## 2015-10-01 MED ORDER — POTASSIUM CHLORIDE IN NACL 20-0.9 MEQ/L-% IV SOLN
INTRAVENOUS | Status: DC
  Administered 2015-10-01: 14:00:00 via INTRAVENOUS
  Filled 2015-10-01: qty 1000

## 2015-10-01 SURGICAL SUPPLY — 50 items
BUR RND FLUTED 2.5 (BURR) IMPLANT
BUR SABER RD CUTTING 3.0 (BURR) ×2 IMPLANT
BUR SABER RD CUTTING 3.0MM (BURR) ×1
CANISTER SUCTION 2500CC (MISCELLANEOUS) ×3 IMPLANT
COVER SURGICAL LIGHT HANDLE (MISCELLANEOUS) ×3 IMPLANT
DERMABOND ADVANCED (GAUZE/BANDAGES/DRESSINGS) ×2
DERMABOND ADVANCED .7 DNX12 (GAUZE/BANDAGES/DRESSINGS) ×1 IMPLANT
DRAPE INCISE IOBAN 66X45 STRL (DRAPES) IMPLANT
DRAPE MICROSCOPE LEICA (MISCELLANEOUS) ×3 IMPLANT
DRAPE PROXIMA HALF (DRAPES) IMPLANT
DRAPE SURG 17X23 STRL (DRAPES) ×12 IMPLANT
DRSG MEPILEX BORDER 4X4 (GAUZE/BANDAGES/DRESSINGS) IMPLANT
DRSG MEPILEX BORDER 4X8 (GAUZE/BANDAGES/DRESSINGS) IMPLANT
DURAPREP 26ML APPLICATOR (WOUND CARE) ×3 IMPLANT
ELECT BLADE 4.0 EZ CLEAN MEGAD (MISCELLANEOUS) ×3
ELECT REM PT RETURN 9FT ADLT (ELECTROSURGICAL) ×3
ELECTRODE BLDE 4.0 EZ CLN MEGD (MISCELLANEOUS) ×1 IMPLANT
ELECTRODE REM PT RTRN 9FT ADLT (ELECTROSURGICAL) ×1 IMPLANT
EVACUATOR 1/8 PVC DRAIN (DRAIN) IMPLANT
GLOVE BIOGEL PI IND STRL 8 (GLOVE) ×1 IMPLANT
GLOVE BIOGEL PI INDICATOR 8 (GLOVE) ×2
GLOVE ECLIPSE 9.0 STRL (GLOVE) ×3 IMPLANT
GLOVE ORTHO TXT STRL SZ7.5 (GLOVE) ×3 IMPLANT
GLOVE SURG 8.5 LATEX PF (GLOVE) ×3 IMPLANT
GOWN STRL REUS W/ TWL LRG LVL3 (GOWN DISPOSABLE) ×1 IMPLANT
GOWN STRL REUS W/TWL 2XL LVL3 (GOWN DISPOSABLE) ×6 IMPLANT
GOWN STRL REUS W/TWL LRG LVL3 (GOWN DISPOSABLE) ×2
KIT BASIN OR (CUSTOM PROCEDURE TRAY) ×3 IMPLANT
KIT ROOM TURNOVER OR (KITS) ×3 IMPLANT
NEEDLE SPNL 18GX3.5 QUINCKE PK (NEEDLE) ×6 IMPLANT
NS IRRIG 1000ML POUR BTL (IV SOLUTION) ×3 IMPLANT
PACK LAMINECTOMY ORTHO (CUSTOM PROCEDURE TRAY) ×3 IMPLANT
PAD ARMBOARD 7.5X6 YLW CONV (MISCELLANEOUS) ×6 IMPLANT
PATTIES SURGICAL .5 X.5 (GAUZE/BANDAGES/DRESSINGS) IMPLANT
PATTIES SURGICAL .75X.75 (GAUZE/BANDAGES/DRESSINGS) IMPLANT
PATTIES SURGICAL 1X1 (DISPOSABLE) IMPLANT
SPONGE LAP 4X18 X RAY DECT (DISPOSABLE) IMPLANT
SPONGE SURGIFOAM ABS GEL 100 (HEMOSTASIS) IMPLANT
SUT VIC AB 0 CT1 27 (SUTURE) ×2
SUT VIC AB 0 CT1 27XBRD ANBCTR (SUTURE) ×1 IMPLANT
SUT VIC AB 1 CT1 27 (SUTURE)
SUT VIC AB 1 CT1 27XBRD ANBCTR (SUTURE) IMPLANT
SUT VIC AB 2-0 CT1 27 (SUTURE) ×2
SUT VIC AB 2-0 CT1 TAPERPNT 27 (SUTURE) ×1 IMPLANT
SUT VIC AB 3-0 X1 27 (SUTURE) ×3 IMPLANT
SUT VICRYL 0 UR6 27IN ABS (SUTURE) IMPLANT
TOWEL OR 17X24 6PK STRL BLUE (TOWEL DISPOSABLE) ×3 IMPLANT
TOWEL OR 17X26 10 PK STRL BLUE (TOWEL DISPOSABLE) ×3 IMPLANT
TRAY FOLEY CATH 16FRSI W/METER (SET/KITS/TRAYS/PACK) IMPLANT
WATER STERILE IRR 1000ML POUR (IV SOLUTION) IMPLANT

## 2015-10-01 NOTE — Discharge Instructions (Signed)
° ° °  No lifting greater than 10 lbs. °Avoid bending, stooping and twisting. °Walk in house for first week them may start to get out slowly increasing distance up to one quarter mile by 3 weeks post op. °Keep incision dry for 3 days, may use tegaderm or similar water impervious dressing. ° °

## 2015-10-01 NOTE — Interval H&P Note (Signed)
The patient has been re-examined, and the chart reviewed, and there have been no interval changes to the documented history and physical.    The risks, benefits, and alternatives have been discussed at length, and the patient is willing to proceed.   

## 2015-10-01 NOTE — Transfer of Care (Signed)
Immediate Anesthesia Transfer of Care Note  Patient: Kenneth Harper  Procedure(s) Performed: Procedure(s): LEFT L2-3 MICRODISCECTOMY (N/A)  Patient Location: PACU  Anesthesia Type:General  Level of Consciousness: awake, alert , oriented and patient cooperative  Airway & Oxygen Therapy: Patient Spontanous Breathing and Patient connected to nasal cannula oxygen  Post-op Assessment: Report given to RN, Post -op Vital signs reviewed and stable, Patient moving all extremities and Patient moving all extremities X 4  Post vital signs: Reviewed and stable  Last Vitals:  Filed Vitals:   10/01/15 0634  BP: 144/78  Pulse: 76  Temp: [phone] C    Complications: No apparent anesthesia complications

## 2015-10-01 NOTE — Brief Op Note (Signed)
PATIENT ID:      Kenneth Harper  MRN:     [id number] DOB/AGE:    [birth year] / 75 y.o.       OPERATIVE REPORT   DATE OF PROCEDURE:  10/01/2015      PREOPERATIVE DIAGNOSIS:   Left L2-3 herniated nucleus pulposus                                                       Body mass index is 29.29 kg/(m^2).    POSTOPERATIVE DIAGNOSIS:   Left L2-3 HNP                                                                     Body mass index is 29.29 kg/(m^2).    PROCEDURE:  Procedure(s): LEFT L2-3 MICRODISCECTOMY    SURGEON: NITKA,JAMES E   ASSISTANT:   Benjiman Core, PA-C          ANESTHESIA:  General and supplemented with local marcaine 0.5% 1:1 exparel 1.3% total 28cc, Dr.      DRAINS:None   COMPLICATIONS:  None   CONDITION:  Stable, extubated and in good condition.  FINDINGS:Left extruded fragment L2-3 foramenal.  EBL: 80CC  NITKA,JAMES E 10/01/2015, 9:41 AM

## 2015-10-01 NOTE — Evaluation (Signed)
Occupational Therapy Evaluation and Discharge Patient Details Name: Kenneth Harper MRN: [id number] DOB: [birth year] Today's Date: 10/01/2015    History of Present Illness Admitted with low back pain with left LE radiculopathy; now s/p microdiscectomy;  has a past medical history of Cataract; Memory loss; Asthma; and Arthritis.   Clinical Impression   This 75 yo male admitted with above presents to acute OT with all education completed, we will sign off.    Follow Up Recommendations  No OT follow up    Equipment Recommendations  None recommended by OT       Precautions / Restrictions Precautions Precautions: Back Precaution Booklet Issued: Yes (comment) Precaution Comments: Pt educated pt in back prec Restrictions Weight Bearing Restrictions: No      Mobility Bed Mobility Overal bed mobility: Needs Assistance Bed Mobility: Rolling;Sidelying to Sit Rolling: Supervision Sidelying to sit: Supervision       General bed mobility comments: Pt up in recliner upon my arrival  Transfers Overall transfer level: Needs assistance Equipment used: None Transfers: Sit to/from Stand Sit to Stand: Modified independent (Device/Increase time)         General transfer comment: Pt ambulated around the entire 5N unit Mod I (pushing IV pole)         ADL Overall ADL's : Modified independent                                       General ADL Comments: Pt able to cross legs to get to feet for LBD. He is able to get up and down from toilet using technique taught him to keep his back straight. He was educated on using two cups to brush teeth (one to spit and one to rinse) and using wet wipes for back peri care post a bowel movement.               Pertinent Vitals/Pain Pain Assessment: No/denies pain Faces Pain Scale: Hurts a little bit Pain Location: Reports some soreness, not nearly as much as preop Pain Descriptors / Indicators: Operative site guarding Pain  Intervention(s): Monitored during session     Hand Dominance Right   Extremity/Trunk Assessment Upper Extremity Assessment Upper Extremity Assessment: Overall WFL for tasks assessed   Lower Extremity Assessment Lower Extremity Assessment: Overall WFL for tasks assessed (for simple mobility tasks)       Communication Communication Communication: No difficulties   Cognition Arousal/Alertness: Awake/alert Behavior During Therapy: WFL for tasks assessed/performed Overall Cognitive Status: Within Functional Limits for tasks assessed                                Home Living Family/patient expects to be discharged to:: Private residence Living Arrangements: Spouse/significant other Available Help at Discharge: Family Type of Home: House Home Access: Stairs to enter Technical brewer of Steps: 2 Entrance Stairs-Rails: Right;Left Home Layout: One level     Bathroom Shower/Tub: Walk-in Hydrologist: Standard     Home Equipment: None          Prior Functioning/Environment Level of Independence: Independent             OT Diagnosis: Generalized weakness         OT Goals(Current goals can be found in the care plan section) Acute Rehab OT Goals Patient Stated Goal: home tomorrow  OT Frequency:  End of Session Equipment Utilized During Treatment:  (none) Nurse Communication:  (nursing student: HR at start in 70's while ambulating into 90's)  Activity Tolerance: Patient tolerated treatment well Patient left: in chair;with call bell/phone within reach;with family/visitor present   Time: [id number] OT Time Calculation (min): 27 min Charges:  OT General Charges $OT Visit: 1 Procedure OT Evaluation $OT Eval Moderate Complexity: 1 Procedure OT Treatments $Self Care/Home Management : 8-22 mins G-Codes: OT G-codes **NOT FOR INPATIENT CLASS** Functional Assessment Tool Used: Clinical observation Functional  Limitation: Self care Self Care Current Status [id number]): At least 1 percent but less than 20 percent impaired, limited or restricted Self Care Goal Status [id number]): At least 1 percent but less than 20 percent impaired, limited or restricted Self Care Discharge Status [phone]): At least 1 percent but less than 20 percent impaired, limited or restricted  Almon Register [id number] 10/01/2015, 5:08 PM

## 2015-10-01 NOTE — Evaluation (Signed)
Physical Therapy Evaluation Patient Details Name: Kenneth Harper MRN: [id number] DOB: [birth year] Today's Date: 10/01/2015   History of Present Illness  Admitted with low back pain with left LE radiculopathy; now s/p microdiscectomy;  has a past medical history of Cataract; Memory loss; Asthma; and Arthritis.  Clinical Impression   Patient evaluated by Physical Therapy with no further acute PT needs identified. All education has been completed and the patient has no further questions.  See below for any follow-up Physical Therapy or equipment needs. PT is signing off. Thank you for this referral.     Follow Up Recommendations Outpatient PT  The potential need for Outpatient PT can be addressed at Ortho follow-up appointments.     Equipment Recommendations  None recommended by PT    Recommendations for Other Services       Precautions / Restrictions Precautions Precautions: Back Precaution Comments: Pt educated pt in back prec      Mobility  Bed Mobility Overal bed mobility: Needs Assistance Bed Mobility: Rolling;Sidelying to Sit Rolling: Supervision Sidelying to sit: Supervision       General bed mobility comments: Cues for log roll technique  Transfers Overall transfer level: Needs assistance Equipment used: None Transfers: Sit to/from Stand Sit to Stand: Supervision;Modified independent (Device/Increase time)         General transfer comment: Cues for back prec  Ambulation/Gait Ambulation/Gait assistance: Supervision;Modified independent (Device/Increase time) Ambulation Distance (Feet): 250 Feet Assistive device: None (and pushing Dinamap) Gait Pattern/deviations: Step-through pattern     General Gait Details: Cues to self-monitor for activity tolerance; initial slight Loss of balance, no loss of balance when pushing IV pole; he has a cane he can use if necessary  Stairs Stairs: Yes Stairs assistance: Modified independent (Device/Increase time) Stair  Management: One rail Right;Step to pattern;Sideways;Forwards Number of Stairs: 5 (2+3) General stair comments: Managing well; no increase in pain  Wheelchair Mobility    Modified Rankin (Stroke Patients Only)       Balance                                             Pertinent Vitals/Pain Pain Assessment: Faces Faces Pain Scale: Hurts a little bit Pain Location: Reports some soreness, not nearly as much as preop Pain Descriptors / Indicators: Operative site guarding Pain Intervention(s): Monitored during session    Home Living Family/patient expects to be discharged to:: Private residence Living Arrangements: Spouse/significant other Available Help at Discharge: Family Type of Home: House Home Access: Stairs to enter Entrance Stairs-Rails: Psychiatric nurse of Steps: 2 Home Layout: One level        Prior Function Level of Independence: Independent               Hand Dominance        Extremity/Trunk Assessment   Upper Extremity Assessment: Overall WFL for tasks assessed           Lower Extremity Assessment: Overall WFL for tasks assessed (for simple mobility tasks)         Communication   Communication: No difficulties  Cognition Arousal/Alertness: Awake/alert Behavior During Therapy: WFL for tasks assessed/performed Overall Cognitive Status: Within Functional Limits for tasks assessed                      General Comments      Exercises  Assessment/Plan    PT Assessment Patent does not need any further PT services  PT Diagnosis Acute pain   PT Problem List    PT Treatment Interventions     PT Goals (Current goals can be found in the Care Plan section) Acute Rehab PT Goals Patient Stated Goal: decr pain PT Goal Formulation: All assessment and education complete, DC therapy    Frequency     Barriers to discharge        Co-evaluation               End of Session    Activity Tolerance: Patient tolerated treatment well Patient left: in chair;with call bell/phone within reach;with family/visitor present Nurse Communication: Mobility status    Functional Assessment Tool Used: Clinical Judgement Functional Limitation: Mobility: Walking and moving around Mobility: Walking and Moving Around Current Status [id number]): 0 percent impaired, limited or restricted Mobility: Walking and Moving Around Goal Status [phone]): 0 percent impaired, limited or restricted Mobility: Walking and Moving Around Discharge Status [phone]): 0 percent impaired, limited or restricted    Time: 1500-1521 PT Time Calculation (min) (ACUTE ONLY): 21 min   Charges:   PT Evaluation $PT Eval Low Complexity: 1 Procedure     PT G Codes:   PT G-Codes **NOT FOR INPATIENT CLASS** Functional Assessment Tool Used: Clinical Judgement Functional Limitation: Mobility: Walking and moving around Mobility: Walking and Moving Around Current Status [id number]): 0 percent impaired, limited or restricted Mobility: Walking and Moving Around Goal Status [id number]): 0 percent impaired, limited or restricted Mobility: Walking and Moving Around Discharge Status [id number]): 0 percent impaired, limited or restricted    Roney Marion [facility] 10/01/2015, 4:16 PM  Roney Marion, [place] Pager [phone] Office [phone]

## 2015-10-01 NOTE — Op Note (Signed)
10/01/2015  9:46 AM  PATIENT:  Kenneth Harper  75 y.o. male  MRN: [id number]  OPERATIVE REPORT  PRE-OPERATIVE DIAGNOSIS:  Left L2-3 herniated nucleus pulposus  POST-OPERATIVE DIAGNOSIS:  Left L2-3 HNP  PROCEDURE:  Procedure(s): LEFT L2-3 MICRODISCECTOMY    SURGEON:  Jessy Oto, MD     ASSISTANT:  Benjiman Core, PA-C  (Present throughout the entire procedure and necessary for completion of procedure in a timely manner)     ANESTHESIA:  General,supplemented with local anesthesia, marcaine 0.5% 1:1 exparel 1.3% total 28 cc. Dr. Roderic Palau    COMPLICATIONS:  None.   EBL: 80cc    DRAINS: None.  COMPLICATIONS:None.   PROCEDURE:The patient was met in the holding area, and the appropriate Left Lumbar level L2-3 identified and marked with "x" and my initials.The patient was then transported to OR and was placed under general anesthesia without difficulty. The patient received appropriate preoperative antibiotic prophylaxis. The patient after intubation atraumatically was transferred to the operating room table, prone position, Wilson frame, sliding OR table. All pressure points were well padded. The arms in 90-90 well-padded at the elbows. Standard prep with iodophor solution lower dorsal spine to the mid sacral segment. Draped in the usual manner clear Vi-Drape was used. Time-out procedure was called and correct. 2x 18-gauge spinal needle was then inserted at the expected L2-3 level. Cross table lateral xray used to identify the spinal needles positions. The lower needle was at the disc space of L2-3. Skin inferior to this was then infiltrated with marcaine 0.5% 1:1 exparel 1.3%total of 16cc used. An incision approximately an inch inch and a half in length was then made through skin and subcutaneous layers in line with the left side of the expected midline just superior to the spinal needle entry point. An incision made into the left lumbosacral fascia approximately an inch in length  .  Cobb elevator was then introduced into the incision site and used to carefully form subperiosteal dissection of the paralumbar muscles off of the posterior lamina of the expected L2-3 level. The depth measured off of the Cobb at about 60 mm and 60 mm retractors then placed on the scaffolding for the [employer] down to and docking on the posterior aspect of the lamina at the expected L2-3 level.Cross table lateral xray was identified Penfield #4 and the retractors at the appropriate level L2-3. The operating room microscope sterilely draped brought into the field. Under the operating room microscope, the L2-3 interspace carefully debrided the small amount of muscle attachment here and high-speed bur used to drill the medial aspect of the inferior articular process of L2 approximately 20%. 2 mm Kerrison then used to enter the spinal canal over the superior aspect of the L3 lamina carefully using the Kerrison to debris the attachment as a curet. Foraminotomy was then performed over the left L3 nerve root. The medial 10% superior articular process of L3 and then resected using 2 mm Kerrison. This allowed for identification of the thecal sac. Penfield 4 was then used to carefully mobilize the thecal sac medially and the  lateral aspect of the L3 nerve root was identified and a Penfield 4 was used to mobilize the nerve medially such that the protruded disc was visible with microscope. Using a Penfield 4 for retraction and a 15 blade scalpel was used to incise the posterior longitudinal ligament within the lateral recess on the left side longitudinally at the level of the disc and superior to the disc space.  Disc material was removed using micropituitary rongeurs and nerve hook nerve root and then more easily able to be mobilized medially and retracted using a Derricho retractor. Further foraminotomies was performed over the L3 nerve root the nerve root was noted to be free without further compression. The nerve  root able to be retracted along the medial aspect of the L3 pedicle and disc material found to be subligamentous extending at this level was further resected current pituitary rongeurs.A large free fragment was able to be freed and removed extending superiorly and into the left L2 neuroforamen.  Ligamentum flavum was further debrided superiorly to the level L2-3 disc. Had a moderate amount of further resection of the L3lamina inferiorly and mediallywas performed. With this then the disc space at L2-3 was easily visualized and entry into the disc at the sided disc herniation was possible using a Penfield 4 intraoperative Lateral radiograph was used to identify the L2-3 disc with the Penfield 4 In place just at the superior aspect of the posterior disc space. Micropituitary was used to further debride this material superficially from the posterior aspect of the intervertebral disc is posterior lateral aspect of the disc. Small amount of further residual disc material was found subligamentous extending laterally from the disc this was removed using micropituitary rongeurs into the left L3 neuroforamen additionally epstein  currettes were used to decompress the left L3 neuroforamen. Upbiting currettes were used to further remove reflected portions of the reflected portions of the medial L2-3 facet and portions of the superior portion of the L3 superior articular  Facet. Ligamentum flavum was debrided and lateral recess along the medial aspect L2-3 facet no further decompression was necessary. Ball tip nerve probe was then able to carefully palpate the neuroforamen for L2 and L4 finding these to be well decompressed. Bleeding was then controlled using thrombin-soaked Gelfoam small cottonoids. Small amount of bleeding within the soft tissue mass the laminotomy area was controlled using bipolar electrocautery. Irrigation was carried out using copious amounts of irrigant solution. All Gelfoam were then removed. No  significant active bleeding present at the time of removal. All instruments sponge counts were correct traction system was then carefully removed carefully rotating retractors with this withdrawal and only bipolar electrocautery of any small bleeders. Lumbodorsal fascia was then carefully approximated with interrupted 0 Vicryl sutures, UR 6 needle deep subcutaneous layers were approximated with interrupted 0 Vicryl sutures on UR 6 the appear subcutaneous layers approximated with interrupted 2-0 Vicryl sutures and the skin closed with a running subcutaneous stitch of 4-0 Vicryl. Dermabond was applied allowed to dry and then Mepilex bandage applied. Patient was then carefully returned to supine position on a stretcher, reactivated and extubated. He was then returned to recovery room in satisfactory condition. Benjiman Core PA-C perform the duties of assistant surgeon during this case. he was present from the beginning of the case to the end of the case assisting in transfer the patient from his stretcher to the OR table and back to the stretcher at the end of the case. Assisted in careful retraction and suction of the laminectomy site delicate neural structures operating under the operating room microscope. he performed closure of the incision from the fascia to the skin applying the dressing.     NITKA,JAMES E  10/01/2015, 9:46 AM

## 2015-10-01 NOTE — H&P (Signed)
Kenneth Harper is an 75 y.o. male.   Chief Complaint: low back pain with left LE radiculopathy HPI:  Patient with hx of Left L2-3 HNP presents with the above complaint.  Progressively worsening symptoms.  Failed conservative treatment.    Past Medical History  Diagnosis Date  . Cataract   . Memory loss   . Asthma     childhood  . Arthritis     knees and back    Past Surgical History  Procedure Laterality Date  . Eye surgery    . Cataract extraction, bilateral Bilateral   . Colonoscopy with propofol N/A 10/29/2014    Procedure: COLONOSCOPY WITH PROPOFOL;  Surgeon: Garlan Fair, MD;  Location: [facility];  Service: Endoscopy;  Laterality: N/A;    Family History  Problem Relation Age of Onset  . Heart failure Father   . Parkinson's disease Mother   . Hydrocephalus Brother   . Diabetes Brother   . Migraines Sister    Social History:  reports that he quit smoking about 4 years ago. His smoking use included Cigars. He has never used smokeless tobacco. He reports that he does not drink alcohol or use illicit drugs.  Allergies: No Known Allergies  No prescriptions prior to admission    No results found for this or any previous visit (from the past 48 hour(s)). No results found.  Review of Systems  Constitutional: Negative.   HENT: Negative.   Eyes: Negative.   Respiratory: Negative.   Cardiovascular: Negative.   Gastrointestinal: Negative.   Genitourinary: Negative.   Musculoskeletal: Positive for back pain.  Skin: Negative.   Endo/Heme/Allergies: Negative.   Psychiatric/Behavioral: Negative.     There were no vitals taken for this visit. Physical Exam  Constitutional: He is oriented to person, place, and time. No distress.  HENT:  Head: Atraumatic.  Eyes: EOM are normal.  Neck: Normal range of motion.  Cardiovascular: Normal rate.   Respiratory: No respiratory distress.  GI: He exhibits no distension.  Musculoskeletal: He exhibits tenderness.   Neurological: He is alert and oriented to person, place, and time.  Skin: Skin is warm and dry.  Psychiatric: He has a normal mood and affect.     EXAM: MRI LUMBAR SPINE WITHOUT CONTRAST  TECHNIQUE: Multiplanar, multisequence MR imaging of the lumbar spine was performed. No intravenous contrast was administered.  COMPARISON: CT abdomen 04/10/2010  FINDINGS: In general, the marrow pattern is somewhat hypercellular. This can be a normal finding but can also be seen in the setting of anemia or other hematopoietic pathology.  T12-L1: Normal.  L1-2: Mild bulging of the disc. No stenosis. Conus tip at this level.  L2-3: Bulging of the disc with a left posterior lateral herniation and a fragment migrated into the foramen on the left likely to compress the left L2 nerve root.  L3-4: Facet degeneration with 2 mm of anterolisthesis. Bulging of the disc. Mild canal stenosis without visible neural compression.  L4-5: Disc degeneration with endplate osteophytes and bulging of the disc. Facet degeneration and ligamentous hypertrophy. Stenosis of both lateral recesses and foramina without definite neural compression.  L5-S1: Chronic disc degeneration with endplate osteophytes and bulging of the disc. Mild facet degeneration. No central canal stenosis. Foraminal encroachment by osteophytes on the left.  IMPRESSION: The significant finding in this case is probably a left posterior lateral to foraminal disc herniation at L2-3 likely to compress the left L2 nerve root.  Mild multifactorial stenosis L3-4 because of facet degeneration and hypertrophy,  2 mm of anterolisthesis and bulging of the disc. No visible neural compression.  Chronic degenerative disc disease and degenerative facet disease at L4-5 with narrowing of the lateral recesses and foramina right more than left.  Chronic degenerative disc disease and degenerative facet disease at L5-S1. Some foraminal  narrowing on the left because of osteophytic encroachment.  Somewhat hypercellular marrow pattern. This may be an normal variant or could be seen in the setting of anemia or other hematopoietic pathology.   Assessment/Plan LEFT L2-3 MICRODISCECTOMY Will proceed with surgery as scheduled.  Procedure along with possible risks and complications discussed.  All questions answered.   Lanae Crumbly, PA-C 10/01/2015, 2:33 AM   Patient examined and lab reviewed with Ricard Dillon, PA-C.

## 2015-10-01 NOTE — Progress Notes (Signed)
     Subjective: Day of Surgery Procedure(s) (LRB): LEFT L2-3 MICRODISCECTOMY (N/A) Awake, alert and oriented x 4. Bradycardia in the PACU 40s-50s. Not symptomatic but notes no previous bradycardia. Patient reports pain as moderate.    Objective:   VITALS:  Temp:  [97.1 F (36.2 C)-97.9 F (36.6 C)] 97.1 F (36.2 C) (03/21 1207) Pulse Rate:  [48-76] 61 (03/21 1145) Resp:  [12-20] 13 (03/21 1230) BP: (135-160)/(78-90) 152/90 mmHg (03/21 1230) SpO2:  [97 %-100 %] 100 % (03/21 1145) Weight:  [207 lb 2 oz (93.951 kg)] 207 lb 2 oz (93.951 kg) (03/21 [id number])  Neurologically intact ABD soft Neurovascular intact Sensation intact distally Intact pulses distally Dorsiflexion/Plantar flexion intact Incision: no drainage   LABS No results for input(s): HGB, WBC, PLT in the last 72 hours. No results for input(s): NA, K, CL, CO2, BUN, CREATININE, GLUCOSE in the last 72 hours. No results for input(s): LABPT, INR in the last 72 hours.   Assessment/Plan: Day of Surgery Procedure(s) (LRB): LEFT L2-3 MICRODISCECTOMY (N/A) Low heart rate post op.  Advance diet Up with therapy Plan for discharge tomorrow  I will plan to ask triad hospitalist to see tis oo  NITKA,JAMES E 10/01/2015, 4:26 PM

## 2015-10-01 NOTE — Anesthesia Procedure Notes (Signed)
Procedure Name: Intubation Date/Time: 10/01/2015 7:40 AM Performed by: Shirlyn Goltz Pre-anesthesia Checklist: Patient identified, Emergency Drugs available, Suction available and Patient being monitored Patient Re-evaluated:Patient Re-evaluated prior to inductionOxygen Delivery Method: Circle system utilized Preoxygenation: Pre-oxygenation with 100% oxygen Intubation Type: IV induction Ventilation: Mask ventilation without difficulty and Oral airway inserted - appropriate to patient size Laryngoscope Size: Mac and 4 Grade View: Grade II Tube type: Oral Tube size: 8.0 mm Number of attempts: 1 Airway Equipment and Method: Stylet Placement Confirmation: ETT inserted through vocal cords under direct vision,  positive ETCO2 and breath sounds checked- equal and bilateral Secured at: 21 cm Tube secured with: Tape Dental Injury: Teeth and Oropharynx as per pre-operative assessment

## 2015-10-01 NOTE — Anesthesia Postprocedure Evaluation (Signed)
Anesthesia Post Note  Patient: Kenneth Harper  Procedure(s) Performed: Procedure(s) (LRB): LEFT L2-3 MICRODISCECTOMY (N/A)  Patient location during evaluation: PACU Anesthesia Type: General Level of consciousness: awake and alert Pain management: pain level controlled Vital Signs Assessment: post-procedure vital signs reviewed and stable Respiratory status: spontaneous breathing, nonlabored ventilation, respiratory function stable and patient connected to nasal cannula oxygen Cardiovascular status: blood pressure returned to baseline and stable Postop Assessment: no signs of nausea or vomiting Anesthetic complications: no    Last Vitals:  Filed Vitals:   10/01/15 1050 10/01/15 1100  BP: 160/85 158/85  Pulse:  50  Temp:    Resp:  16    Last Pain:  Filed Vitals:   10/01/15 1108  PainSc: 0-No pain                 Shameka Aggarwal,W. EDMOND

## 2015-10-01 NOTE — Progress Notes (Signed)
Pt is bradycardic 47-55bpm. Pt is asymptomatic. BP=153/81. Pt states he feels fine. Dr.Fitzgerald notified. No new orders. Will cont to monitor.

## 2015-10-02 ENCOUNTER — Encounter (HOSPITAL_COMMUNITY): Payer: Self-pay | Admitting: Specialist

## 2015-10-02 DIAGNOSIS — M5126 Other intervertebral disc displacement, lumbar region: Secondary | ICD-10-CM | POA: Diagnosis not present

## 2015-10-02 NOTE — Progress Notes (Signed)
     Subjective: 1 Day Post-Op Procedure(s) (LRB): LEFT L2-3 MICRODISCECTOMY (N/A) Awake, alert and oriented x 4. No more episodes of bradycardia. Assymptomatic from cardiac standpoint. EKG apparently not done Though ordered Stat last evening. I spoke with cardiology Dr. Wendi Snipes Who felt that the brady episode was likely due to vaso-vagal response. I'm ready to go. The left leg pain is gone. Patient reports pain as moderate.    Objective:   VITALS:  Temp:  [97.1 F (36.2 C)-98.1 F (36.7 C)] 97.5 F (36.4 C) (03/22 0156) Pulse Rate:  [48-76] 57 (03/22 0156) Resp:  [12-20] 15 (03/22 0156) BP: (117-160)/(59-90) 117/59 mmHg (03/22 0156) SpO2:  [97 %-100 %] 99 % (03/22 0156)  Neurologically intact ABD soft Neurovascular intact Sensation intact distally Intact pulses distally Dorsiflexion/Plantar flexion intact Incision: dressing C/D/I and no drainage   LABS No results for input(s): HGB, WBC, PLT in the last 72 hours. No results for input(s): NA, K, CL, CO2, BUN, CREATININE, GLUCOSE in the last 72 hours. No results for input(s): LABPT, INR in the last 72 hours.   Assessment/Plan: 1 Day Post-Op Procedure(s) (LRB): LEFT L2-3 MICRODISCECTOMY (N/A)  Advance diet Up with therapy D/C IV fluids Discharge home with home health  Kenneth Harper E 10/02/2015, 7:25 AM

## 2016-03-12 DIAGNOSIS — M5116 Intervertebral disc disorders with radiculopathy, lumbar region: Secondary | ICD-10-CM | POA: Diagnosis not present

## 2016-09-17 DIAGNOSIS — R7303 Prediabetes: Secondary | ICD-10-CM | POA: Diagnosis not present

## 2016-09-17 DIAGNOSIS — M1990 Unspecified osteoarthritis, unspecified site: Secondary | ICD-10-CM | POA: Diagnosis not present

## 2016-09-17 DIAGNOSIS — E7800 Pure hypercholesterolemia, unspecified: Secondary | ICD-10-CM | POA: Diagnosis not present

## 2016-09-17 DIAGNOSIS — Z125 Encounter for screening for malignant neoplasm of prostate: Secondary | ICD-10-CM | POA: Diagnosis not present

## 2016-09-17 DIAGNOSIS — K219 Gastro-esophageal reflux disease without esophagitis: Secondary | ICD-10-CM | POA: Diagnosis not present

## 2016-09-17 DIAGNOSIS — Z0000 Encounter for general adult medical examination without abnormal findings: Secondary | ICD-10-CM | POA: Diagnosis not present

## 2016-09-17 DIAGNOSIS — Z1389 Encounter for screening for other disorder: Secondary | ICD-10-CM | POA: Diagnosis not present

## 2016-09-17 DIAGNOSIS — D649 Anemia, unspecified: Secondary | ICD-10-CM | POA: Diagnosis not present

## 2016-12-29 DIAGNOSIS — L299 Pruritus, unspecified: Secondary | ICD-10-CM | POA: Diagnosis not present

## 2016-12-29 DIAGNOSIS — L237 Allergic contact dermatitis due to plants, except food: Secondary | ICD-10-CM | POA: Diagnosis not present

## 2017-01-07 DIAGNOSIS — L237 Allergic contact dermatitis due to plants, except food: Secondary | ICD-10-CM | POA: Diagnosis not present

## 2017-04-14 DIAGNOSIS — M1990 Unspecified osteoarthritis, unspecified site: Secondary | ICD-10-CM | POA: Diagnosis not present

## 2017-04-14 DIAGNOSIS — E669 Obesity, unspecified: Secondary | ICD-10-CM | POA: Diagnosis not present

## 2017-04-14 DIAGNOSIS — H6123 Impacted cerumen, bilateral: Secondary | ICD-10-CM | POA: Diagnosis not present

## 2017-04-14 DIAGNOSIS — R7303 Prediabetes: Secondary | ICD-10-CM | POA: Diagnosis not present

## 2017-04-14 DIAGNOSIS — Z23 Encounter for immunization: Secondary | ICD-10-CM | POA: Diagnosis not present

## 2017-04-14 DIAGNOSIS — Z6835 Body mass index (BMI) 35.0-35.9, adult: Secondary | ICD-10-CM | POA: Diagnosis not present

## 2017-04-14 DIAGNOSIS — E7800 Pure hypercholesterolemia, unspecified: Secondary | ICD-10-CM | POA: Diagnosis not present

## 2017-04-29 DIAGNOSIS — H6123 Impacted cerumen, bilateral: Secondary | ICD-10-CM | POA: Diagnosis not present

## 2017-04-29 DIAGNOSIS — H93293 Other abnormal auditory perceptions, bilateral: Secondary | ICD-10-CM | POA: Diagnosis not present

## 2017-04-29 DIAGNOSIS — Z87891 Personal history of nicotine dependence: Secondary | ICD-10-CM | POA: Diagnosis not present

## 2017-04-29 DIAGNOSIS — Z972 Presence of dental prosthetic device (complete) (partial): Secondary | ICD-10-CM | POA: Diagnosis not present

## 2017-06-01 DIAGNOSIS — H6123 Impacted cerumen, bilateral: Secondary | ICD-10-CM | POA: Diagnosis not present

## 2018-01-31 DIAGNOSIS — E7800 Pure hypercholesterolemia, unspecified: Secondary | ICD-10-CM | POA: Diagnosis not present

## 2018-01-31 DIAGNOSIS — Z0000 Encounter for general adult medical examination without abnormal findings: Secondary | ICD-10-CM | POA: Diagnosis not present

## 2018-01-31 DIAGNOSIS — Z1389 Encounter for screening for other disorder: Secondary | ICD-10-CM | POA: Diagnosis not present

## 2018-01-31 DIAGNOSIS — M1990 Unspecified osteoarthritis, unspecified site: Secondary | ICD-10-CM | POA: Diagnosis not present

## 2018-01-31 DIAGNOSIS — N400 Enlarged prostate without lower urinary tract symptoms: Secondary | ICD-10-CM | POA: Diagnosis not present

## 2018-01-31 DIAGNOSIS — K635 Polyp of colon: Secondary | ICD-10-CM | POA: Diagnosis not present

## 2018-01-31 DIAGNOSIS — H9190 Unspecified hearing loss, unspecified ear: Secondary | ICD-10-CM | POA: Diagnosis not present

## 2018-01-31 DIAGNOSIS — R7303 Prediabetes: Secondary | ICD-10-CM | POA: Diagnosis not present

## 2018-01-31 DIAGNOSIS — R413 Other amnesia: Secondary | ICD-10-CM | POA: Diagnosis not present

## 2018-01-31 DIAGNOSIS — K219 Gastro-esophageal reflux disease without esophagitis: Secondary | ICD-10-CM | POA: Diagnosis not present

## 2018-02-03 DIAGNOSIS — H9193 Unspecified hearing loss, bilateral: Secondary | ICD-10-CM | POA: Diagnosis not present

## 2018-02-03 DIAGNOSIS — H6123 Impacted cerumen, bilateral: Secondary | ICD-10-CM | POA: Diagnosis not present

## 2018-03-24 ENCOUNTER — Encounter: Payer: Self-pay | Admitting: *Deleted

## 2018-03-28 ENCOUNTER — Ambulatory Visit (INDEPENDENT_AMBULATORY_CARE_PROVIDER_SITE_OTHER): Payer: Medicare Other | Admitting: Diagnostic Neuroimaging

## 2018-03-28 ENCOUNTER — Encounter: Payer: Self-pay | Admitting: Diagnostic Neuroimaging

## 2018-03-28 ENCOUNTER — Telehealth: Payer: Self-pay | Admitting: Diagnostic Neuroimaging

## 2018-03-28 VITALS — BP 135/82 | HR 76 | Ht 70.5 in | Wt 227.0 lb

## 2018-03-28 DIAGNOSIS — R413 Other amnesia: Secondary | ICD-10-CM | POA: Diagnosis not present

## 2018-03-28 DIAGNOSIS — G4761 Periodic limb movement disorder: Secondary | ICD-10-CM

## 2018-03-28 DIAGNOSIS — G2581 Restless legs syndrome: Secondary | ICD-10-CM | POA: Diagnosis not present

## 2018-03-28 NOTE — Progress Notes (Signed)
[facility]  PATIENT: Kenneth Harper DOB: 1940-11-21  REFERRING CLINICIAN: Tommie Ard HISTORY FROM: patient  REASON FOR VISIT: new consult    HISTORICAL  CHIEF COMPLAINT:  Chief Complaint  Patient presents with  . New Patient (Initial Visit)    Rm 6,  alone  . Memory Loss (pts states ST memory)    Dr. Lysle Rubens.      HISTORY OF PRESENT ILLNESS:   77 year old male here for evaluation of short-term memory loss.  Patient reports at least 1 to 2 years of gradual onset, fairly stable short-term memory loss.  Sometimes he forgets recent conversations, names of people or other recent events.  Otherwise he is still working and doing well in his job that he has been doing for more than 30 years.  He is able to maintain all activities of daily living.  He also helps take care of his wife who is sick frequently.  No problems with driving, shopping, finances or any other issues.  Patient also has chronic low back pain issues, status post surgery which has significantly helped.  Patient also has long history of restless leg syndrome symptoms.  Patient's wife is noticed some intermittent leg jerks and kicks in his sleep.  Patient has history of chronic insomnia, averaging 4 hours of sleep per night for many years.  In the last 1 year his sleep has slightly improved and now he is averaging 6 to 8 hours of sleep per night.   REVIEW OF SYSTEMS: Full 14 system review of systems performed and negative with exception of: Memory loss restless legs easy bruising cough joint pain runny nose impotence.  ALLERGIES: No Known Allergies  HOME MEDICATIONS: Outpatient Medications Prior to Visit  Medication Sig Dispense Refill  . acetaminophen (TYLENOL) 500 MG tablet Take 1,000 mg by mouth daily as needed for mild pain.    Marland Kitchen aspirin 81 MG EC tablet Take 81 mg by mouth every Monday, Wednesday, and Friday at 6 PM. Swallow whole.    Marland Kitchen atorvastatin (LIPITOR) 40 MG tablet Take 40 mg by mouth  daily.   1  . Calcium Carbonate-Vitamin D (CALTRATE 600+D PO) Take 1 tablet by mouth daily.    . Multiple Vitamins-Minerals (MULTIVITAMIN WITH MINERALS) tablet Take 1 tablet by mouth daily.    . VOLTAREN 1 % GEL Apply 2 g topically daily as needed (for knees).   0  . cholecalciferol (VITAMIN D) 1000 UNITS tablet Take 1,000 Units by mouth daily.    . Omega-3 Fatty Acids (FISH OIL) 1000 MG CAPS Take 1 capsule by mouth daily.      No facility-administered medications prior to visit.     PAST MEDICAL HISTORY: Past Medical History:  Diagnosis Date  . Arthritis    knees and back  . Asthma    childhood  . Carpal tunnel syndrome   . Cataract   . Diverticulitis   . GERD (gastroesophageal reflux disease)   . Hypercholesterolemia   . Memory loss     PAST SURGICAL HISTORY: Past Surgical History:  Procedure Laterality Date  . CATARACT EXTRACTION, BILATERAL Bilateral   . COLONOSCOPY WITH PROPOFOL N/A 10/29/2014   Procedure: COLONOSCOPY WITH PROPOFOL;  Surgeon: Garlan Fair, MD;  Location: [facility];  Service: Endoscopy;  Laterality: N/A;  . EYE SURGERY    . LUMBAR LAMINECTOMY/DECOMPRESSION MICRODISCECTOMY N/A 10/01/2015   Procedure: LEFT L2-3 MICRODISCECTOMY;  Surgeon: Jessy Oto, MD;  Location: [facility];  Service: Orthopedics;  Laterality: N/A;  FAMILY HISTORY: Family History  Problem Relation Age of Onset  . Parkinson's disease Mother   . Heart failure Father   . Diabetes Brother   . Cancer Brother        throat  . Hydrocephalus Brother   . Diabetes Brother   . Migraines Sister     SOCIAL HISTORY: Social History   Socioeconomic History  . Marital status: Married    Spouse name: Not on file  . Number of children: Not on file  . Years of education: Not on file  . Highest education level: Not on file  Occupational History  . Not on file  Social Needs  . Financial resource strain: Not on file  . Food insecurity:    Worry: Not on file    Inability: Not on file    . Transportation needs:    Medical: Not on file    Non-medical: Not on file  Tobacco Use  . Smoking status: Former Smoker    Years: 42.00    Types: Cigars    Last attempt to quit: 10/22/2010    Years since quitting: 7.4  . Smokeless tobacco: Never Used  Substance and Sexual Activity  . Alcohol use: No    Alcohol/week: 0.0 standard drinks  . Drug use: No  . Sexual activity: Never    Birth control/protection: Abstinence  Lifestyle  . Physical activity:    Days per week: Not on file    Minutes per session: Not on file  . Stress: Not on file  Relationships  . Social connections:    Talks on phone: Not on file    Gets together: Not on file    Attends religious service: Not on file    Active member of club or organization: Not on file    Attends meetings of clubs or organizations: Not on file    Relationship status: Not on file  . Intimate partner violence:    Fear of current or ex partner: Not on file    Emotionally abused: Not on file    Physically abused: Not on file    Forced sexual activity: Not on file  Other Topics Concern  . Not on file  Social History Narrative   Lives home with wife.  Works at [employer].  Education HS.  Children 2. Caffeine 2-4 cups daily.       PHYSICAL EXAM  GENERAL EXAM/CONSTITUTIONAL: Vitals:  Vitals:   03/28/18 0857  BP: 135/82  Pulse: 76  Weight: 227 lb (103 kg)  Height: 5' 10.5" (1.791 m)     Body mass index is 32.11 kg/m. Wt Readings from Last 3 Encounters:  03/28/18 227 lb (103 kg)  10/01/15 207 lb 2 oz (94 kg)  09/25/15 207 lb 0.2 oz (93.9 kg)     Patient is in no distress; well developed, nourished and groomed; neck is supple  CARDIOVASCULAR:  Examination of carotid arteries is normal; no carotid bruits  Regular rate and rhythm, no murmurs  Examination of peripheral vascular system by observation and palpation is normal  EYES:  Ophthalmoscopic exam of optic discs and posterior segments is normal;  no papilledema or hemorrhages  No exam data present  MUSCULOSKELETAL:  Gait, strength, tone, movements noted in Neurologic exam below  NEUROLOGIC: MENTAL STATUS:  MMSE - Mini Mental State Exam 03/28/2018  Orientation to time 5  Orientation to Place 5  Registration 3  Attention/ Calculation 5  Recall 2  Language- name 2 objects 2  Language-  repeat 1  Language- follow 3 step command 2  Language- read & follow direction 1  Write a sentence 1  Copy design 1  Total score 28    awake, alert, oriented to person, place and time  recent and remote memory intact  normal attention and concentration  language fluent, comprehension intact, naming intact  fund of knowledge appropriate  CRANIAL NERVE:   2nd - no papilledema on fundoscopic exam  2nd, 3rd, 4th, 6th - pupils equal and reactive to light, visual fields full to confrontation, extraocular muscles intact, no nystagmus  5th - facial sensation symmetric  7th - facial strength symmetric  8th - hearing intact  9th - palate elevates symmetrically, uvula midline  11th - shoulder shrug symmetric  12th - tongue protrusion midline  MOTOR:   normal bulk and tone, full strength in the BUE, BLE  SENSORY:   normal and symmetric to light touch, pinprick, temperature, vibration  COORDINATION:   finger-nose-finger, fine finger movements normal  REFLEXES:   deep tendon reflexes present and symmetric  GAIT/STATION:   narrow based gait; able to walk on toes, heels and tandem; romberg is negative     DIAGNOSTIC DATA (LABS, IMAGING, TESTING) - I reviewed patient records, labs, notes, testing and imaging myself where available.  Lab Results  Component Value Date   WBC 5.4 09/25/2015   HGB 12.4 (L) 09/25/2015   HCT 39.5 09/25/2015   MCV 76.3 (L) 09/25/2015   PLT 201 09/25/2015      Component Value Date/Time   NA 140 09/25/2015 0941   K 3.9 09/25/2015 0941   CL 107 09/25/2015 0941   CO2 24 09/25/2015 0941    GLUCOSE 112 (H) 09/25/2015 0941   BUN 8 09/25/2015 0941   CREATININE 0.98 09/25/2015 0941   CALCIUM 9.3 09/25/2015 0941   PROT 6.6 09/25/2015 0941   ALBUMIN 3.8 09/25/2015 0941   AST 21 09/25/2015 0941   ALT 14 (L) 09/25/2015 0941   ALKPHOS 76 09/25/2015 0941   BILITOT 0.7 09/25/2015 0941   GFRNONAA >60 09/25/2015 0941   GFRAA >60 09/25/2015 0941   No results found for: CHOL, HDL, LDLCALC, LDLDIRECT, TRIG, CHOLHDL No results found for: HGBA1C No results found for: VITAMINB12 No results found for: TSH   08/11/15 MRI lumbar spine [I reviewed images myself and agree with interpretation. -VRP]  - The significant finding in this case is probably a left posterior lateral to foraminal disc herniation at L2-3 likely to compress the left L2 nerve root. - Mild multifactorial stenosis L3-4 because of facet degeneration and hypertrophy, 2 mm of anterolisthesis and bulging of the disc. No visible neural compression. - Chronic degenerative disc disease and degenerative facet disease at L4-5 with narrowing of the lateral recesses and foramina right more than left. - Chronic degenerative disc disease and degenerative facet disease at L5-S1. Some foraminal narrowing on the left because of osteophytic encroachment. - Somewhat hypercellular marrow pattern. This may be an normal variant or could be seen in the setting of anemia or other hematopoietic pathology.    ASSESSMENT AND PLAN  77 y.o. year old male here with gradual onset, fairly stable with short-term memory loss since 2017.  No change in activities of daily living.  MMSE 28/30  AFT 17   Katz ADL 6/6  [place] IADL 7/8   Dx:  1. Memory loss   2. Periodic limb movements of sleep   3. Restless legs syndrome (RLS)     PLAN:  MEMORY LOSS (  [facility]) - check MRI brain - check TSH, B12 - optimize nutrition and exercise  RLS / PLMS (? Associated with lumbar degenerative spine dz) - monitor; conservative mgmt for now - could  consider ropinirole / pramipexole / gabapentin / clonazepam in future  Orders Placed This Encounter  Procedures  . MR BRAIN WO CONTRAST  . Vitamin B12  . TSH   Return if symptoms worsen or fail to improve, for pending test results.    Penni Bombard, MD 8/33/3832, 9:19 AM Certified in Neurology, Neurophysiology and Neuroimaging  [facility] [address], [place], [place] [address] [phone]

## 2018-03-28 NOTE — Telephone Encounter (Signed)
Medicare/bcbs supp order sent to GI. No auth they will reach out to the pt to schedule.  °

## 2018-03-28 NOTE — Patient Instructions (Signed)
Thank you for coming to see us at [facility]. I hope we have been able to provide you high quality care today.  You may receive a patient satisfaction survey over the next few weeks. We would appreciate your feedback and comments so that we may continue to improve ourselves and the health of our patients.  - check MRI brain and lab testing   ~~~~~~~~~~~~~~~~~~~~~~~~~~~~~~~~~~~~~~~~~~~~~~~~~~~~~~~~~~~~~~~~~  DR. PENUMALLI'S GUIDE TO HAPPY AND HEALTHY LIVING These are some of my general health and wellness recommendations. Some of them may apply to you better than others. Please use common sense as you try these suggestions and feel free to ask me any questions.   ACTIVITY/FITNESS Mental, social, emotional and physical stimulation are very important for brain and body health. Try learning a new activity (arts, music, language, sports, games).  Keep moving your body to the best of your abilities. You can do this at home, inside or outside, the park, community center, gym or anywhere you like. Consider a physical therapist or personal trainer to get started. Fitness trackers, smart-watches or  smart-phones can help as well.   NUTRITION Eat more plants: colorful vegetables, nuts, seeds and berries.  Eat less sugar, salt, preservatives and processed foods.  Avoid toxins such as cigarettes and alcohol.  Drink water when you are thirsty. Warm water with a slice of lemon is an excellent morning drink to start the day.  Consider these websites for more information The Nutrition Source (https://www.hsph.[employer].edu/nutritionsource) Precision Nutrition (www.precisionnutrition.com/blog/infographics)   RELAXATION Consider practicing mindfulness meditation or other relaxation techniques such as deep breathing, prayer, yoga, tai chi, massage. See website mindful.org or the apps Headspace or Calm to help get started.   SLEEP Try to get at least 7-8+ hours sleep per day.  Regular exercise and reduced caffeine will help you sleep better. Practice good sleep hygeine techniques. See website sleep.org for more information.   PLANNING Prepare estate planning, living will, healthcare POA documents. Sometimes this is best planned with the help of an attorney. Theconversationproject.org and agingwithdignity.org are excellent resources.  

## 2018-03-29 DIAGNOSIS — D123 Benign neoplasm of transverse colon: Secondary | ICD-10-CM | POA: Diagnosis not present

## 2018-03-29 DIAGNOSIS — Z86010 Personal history of colonic polyps: Secondary | ICD-10-CM | POA: Diagnosis not present

## 2018-03-29 DIAGNOSIS — D120 Benign neoplasm of cecum: Secondary | ICD-10-CM | POA: Diagnosis not present

## 2018-03-29 DIAGNOSIS — K5730 Diverticulosis of large intestine without perforation or abscess without bleeding: Secondary | ICD-10-CM | POA: Diagnosis not present

## 2018-03-29 LAB — VITAMIN B12: VITAMIN B 12: 629 pg/mL (ref 232–1245)

## 2018-03-29 LAB — TSH: TSH: 1.92 u[IU]/mL (ref 0.450–4.500)

## 2018-04-01 DIAGNOSIS — D120 Benign neoplasm of cecum: Secondary | ICD-10-CM | POA: Diagnosis not present

## 2018-04-01 DIAGNOSIS — D123 Benign neoplasm of transverse colon: Secondary | ICD-10-CM | POA: Diagnosis not present

## 2018-04-05 ENCOUNTER — Other Ambulatory Visit: Payer: Self-pay

## 2018-04-05 ENCOUNTER — Emergency Department (HOSPITAL_COMMUNITY): Payer: Medicare Other

## 2018-04-05 ENCOUNTER — Encounter (HOSPITAL_COMMUNITY): Payer: Self-pay

## 2018-04-05 ENCOUNTER — Emergency Department (HOSPITAL_COMMUNITY)
Admission: EM | Admit: 2018-04-05 | Discharge: 2018-04-05 | Disposition: A | Discharge status: Home / Self Care | Payer: Medicare Other | Attending: Emergency Medicine | Admitting: Emergency Medicine

## 2018-04-05 DIAGNOSIS — K922 Gastrointestinal hemorrhage, unspecified: Secondary | ICD-10-CM | POA: Diagnosis not present

## 2018-04-05 DIAGNOSIS — J929 Pleural plaque without asbestos: Secondary | ICD-10-CM | POA: Diagnosis not present

## 2018-04-05 DIAGNOSIS — K625 Hemorrhage of anus and rectum: Secondary | ICD-10-CM

## 2018-04-05 LAB — APTT: aPTT: 30 seconds (ref 24–36)

## 2018-04-05 LAB — CBC WITH DIFFERENTIAL/PLATELET
Basophils Absolute: 0 10*3/uL (ref 0.0–0.1)
Basophils Relative: 0 %
Eosinophils Absolute: 0.1 10*3/uL (ref 0.0–0.7)
Eosinophils Relative: 1 %
HEMATOCRIT: 37 % — AB (ref 39.0–52.0)
HEMOGLOBIN: 12.3 g/dL — AB (ref 13.0–17.0)
LYMPHS ABS: 1.5 10*3/uL (ref 0.7–4.0)
Lymphocytes Relative: 24 %
MCH: 30.1 pg (ref 26.0–34.0)
MCHC: 33.2 g/dL (ref 30.0–36.0)
MCV: 90.5 fL (ref 78.0–100.0)
MONO ABS: 0.6 10*3/uL (ref 0.1–1.0)
MONOS PCT: 9 %
Neutro Abs: 4 10*3/uL (ref 1.7–7.7)
Neutrophils Relative %: 66 %
Platelets: 195 10*3/uL (ref 150–400)
RBC: 4.09 MIL/uL — ABNORMAL LOW (ref 4.22–5.81)
RDW: 14 % (ref 11.5–15.5)
WBC: 6.2 10*3/uL (ref 4.0–10.5)

## 2018-04-05 LAB — COMPREHENSIVE METABOLIC PANEL
ALBUMIN: 3.2 g/dL — AB (ref 3.5–5.0)
ALK PHOS: 61 U/L (ref 38–126)
ALT: 15 U/L (ref 0–44)
AST: 20 U/L (ref 15–41)
Anion gap: 8 (ref 5–15)
BUN: 28 mg/dL — ABNORMAL HIGH (ref 8–23)
CHLORIDE: 108 mmol/L (ref 98–111)
CO2: 25 mmol/L (ref 22–32)
CREATININE: 1.65 mg/dL — AB (ref 0.61–1.24)
Calcium: 8.6 mg/dL — ABNORMAL LOW (ref 8.9–10.3)
GFR calc non Af Amer: 39 mL/min — ABNORMAL LOW (ref >60)
GFR, EST AFRICAN AMERICAN: 45 mL/min — AB (ref >60)
GLUCOSE: 156 mg/dL — AB (ref 70–99)
Potassium: 3.9 mmol/L (ref 3.5–5.1)
SODIUM: 141 mmol/L (ref 135–145)
Total Bilirubin: 0.4 mg/dL (ref 0.3–1.2)
Total Protein: 5.7 g/dL — ABNORMAL LOW (ref 6.5–8.1)

## 2018-04-05 LAB — POC OCCULT BLOOD, ED: FECAL OCCULT BLD: POSITIVE — AB

## 2018-04-05 LAB — PROTIME-INR
INR: 1.02
Prothrombin Time: 13.3 seconds (ref 11.4–15.2)

## 2018-04-05 LAB — TYPE AND SCREEN
ABO/RH(D): A NEG
ANTIBODY SCREEN: NEGATIVE

## 2018-04-05 MED ORDER — SODIUM CHLORIDE 0.9 % IV SOLN
INTRAVENOUS | Status: DC
  Administered 2018-04-05: 17:00:00 via INTRAVENOUS

## 2018-04-05 NOTE — ED Triage Notes (Signed)
Pt c/o bright red blood and clots from rectum today. States he had a recent colonoscopy last Tuesday. Denies blood thinners.

## 2018-04-05 NOTE — Discharge Instructions (Addendum)
Follow up with your primary care doctor and/or GI doctor, return to the emergency room if you have any recurrent bleeding, or start feeling lightheaded and dizzy.  Stop taking your aspirin until you are cleared by your doctor

## 2018-04-05 NOTE — ED Provider Notes (Signed)
[facility] DEPT Provider Note   CSN: [id number] Arrival date & time: 04/05/18  1538     History   Chief Complaint Chief Complaint  Patient presents with  . Rectal Bleeding    HPI Kenneth Harper is a 77 y.o. male.  HPI Patient presented to the emergency room for evaluation of rectal bleeding.  Patient states he was driving in his car this afternoon when he suddenly had a feeling that he had to have a bowel movement.  He stopped and used the bathroom along the way.  He ended up noticing just blood in his stool.  Patient had to stop one more time when he was driving home.  Since then he has had 2 more episodes of bright red blood.  Patient does feel slightly lightheaded.  He gets short of breath when he walks around.  He denies any trouble with chest pain or abdominal pain.  Patient has no history of prior rectal bleeding.  Does have a history of diverticulitis.  He states he also had a colonoscopy recently and had 2 polyps removed. Past Medical History:  Diagnosis Date  . Arthritis    knees and back  . Asthma    childhood  . Carpal tunnel syndrome   . Cataract   . Diverticulitis   . GERD (gastroesophageal reflux disease)   . Hypercholesterolemia   . Memory loss     Patient Active Problem List   Diagnosis Date Noted  . Lumbar herniated disc 10/01/2015  . Herniation of lumbar intervertebral disc with radiculopathy 09/30/2015    Class: Acute    Past Surgical History:  Procedure Laterality Date  . CATARACT EXTRACTION, BILATERAL Bilateral   . COLONOSCOPY WITH PROPOFOL N/A 10/29/2014   Procedure: COLONOSCOPY WITH PROPOFOL;  Surgeon: Garlan Fair, MD;  Location: [facility];  Service: Endoscopy;  Laterality: N/A;  . EYE SURGERY    . LUMBAR LAMINECTOMY/DECOMPRESSION MICRODISCECTOMY N/A 10/01/2015   Procedure: LEFT L2-3 MICRODISCECTOMY;  Surgeon: Jessy Oto, MD;  Location: [facility];  Service: Orthopedics;  Laterality: N/A;        Home  Medications    Prior to Admission medications   Medication Sig Start Date End Date Taking? Authorizing Provider  acetaminophen (TYLENOL) 500 MG tablet Take 1,000 mg by mouth daily as needed for mild pain.   Yes [provider]  aspirin 81 MG EC tablet Take 81 mg by mouth every Monday, Wednesday, and Friday at 6 PM. Swallow whole.   Yes [provider]  atorvastatin (LIPITOR) 40 MG tablet Take 40 mg by mouth daily.  08/22/14  Yes [provider]  Calcium Carbonate-Vitamin D (CALTRATE 600+D PO) Take 1 tablet by mouth daily.   Yes [provider]  Multiple Vitamins-Minerals (MULTIVITAMIN WITH MINERALS) tablet Take 1 tablet by mouth daily.   Yes [provider]  Omega-3 Fatty Acids (FISH OIL PO) Take 1 tablet by mouth daily.   Yes [provider]  OVER THE COUNTER MEDICATION Apply 1 application topically daily as needed. Hemp Cream   Yes [provider]    Family History Family History  Problem Relation Age of Onset  . Parkinson's disease Mother   . Heart failure Father   . Diabetes Brother   . Cancer Brother        throat  . Hydrocephalus Brother   . Diabetes Brother   . Migraines Sister     Social History Social History   Tobacco Use  .  Smoking status: Former Smoker    Years: 42.00    Types: Cigars    Last attempt to quit: 10/22/2010    Years since quitting: 7.4  . Smokeless tobacco: Never Used  Substance Use Topics  . Alcohol use: No    Alcohol/week: 0.0 standard drinks  . Drug use: No     Allergies   Patient has no known allergies.   Review of Systems Review of Systems  All other systems reviewed and are negative.    Physical Exam Updated Vital Signs BP 112/74   Pulse (!) 108   Temp 97.8 F (36.6 C)   Resp 16   Ht 1.778 m (5\' 10" )   Wt 99.8 kg   SpO2 98%   BMI 31.57 kg/m   Physical Exam  Constitutional: No distress.  Pale  HENT:  Head: Normocephalic and atraumatic.  Right Ear: External  ear normal.  Left Ear: External ear normal.  Eyes: Conjunctivae are normal. Right eye exhibits no discharge. Left eye exhibits no discharge. No scleral icterus.  Neck: Neck supple. No tracheal deviation present.  Cardiovascular: Normal rate, regular rhythm and intact distal pulses.  Pulmonary/Chest: Effort normal and breath sounds normal. No stridor. No respiratory distress. He has no wheezes. He has no rales.  Abdominal: Soft. Bowel sounds are normal. He exhibits no distension. There is no tenderness. There is no rebound and no guarding.  Genitourinary:  Genitourinary Comments: Bright red blood on rectal exam, no clots, no masses palpated  Musculoskeletal: He exhibits no edema or tenderness.  Neurological: He is alert. He has normal strength. No cranial nerve deficit (no facial droop, extraocular movements intact, no slurred speech) or sensory deficit. He exhibits normal muscle tone. He displays no seizure activity. Coordination normal.  Skin: Skin is warm and dry. No rash noted.  Psychiatric: He has a normal mood and affect.  Nursing note and vitals reviewed.    ED Treatments / Results  Labs (all labs ordered are listed, but only abnormal results are displayed) Labs Reviewed  COMPREHENSIVE METABOLIC PANEL - Abnormal; Notable for the following components:      Result Value   Glucose, Bld 156 (*)    BUN 28 (*)    Creatinine, Ser 1.65 (*)    Calcium 8.6 (*)    Total Protein 5.7 (*)    Albumin 3.2 (*)    GFR calc non Af Amer 39 (*)    GFR calc Af Amer 45 (*)    All other components within normal limits  CBC WITH DIFFERENTIAL/PLATELET - Abnormal; Notable for the following components:   RBC 4.09 (*)    Hemoglobin 12.3 (*)    HCT 37.0 (*)    All other components within normal limits  POC OCCULT BLOOD, ED - Abnormal; Notable for the following components:   Fecal Occult Bld POSITIVE (*)    All other components within normal limits  APTT  PROTIME-INR  POC OCCULT BLOOD, ED  TYPE AND  SCREEN  ABO/RH     Radiology Dg Chest Port 1 View  Result Date: 04/05/2018 CLINICAL DATA:  Bright red blood from rectum today, recent colonoscopy last Tuesday, history diverticulitis, GERD, former smoker EXAM: PORTABLE CHEST 1 VIEW COMPARISON:  Portable exam 1729 hours compared to 09/25/2015 FINDINGS: Normal heart size, mediastinal contours, and pulmonary vascularity. Minimal chronic peribronchial thickening. No acute infiltrate, pleural effusion, or pneumothorax. Bones unremarkable. IMPRESSION: No acute abnormalities. Electronically Signed   By: Lavonia Dana M.D.   On: 04/05/2018 18:22  Procedures Procedures (including critical care time)  Medications Ordered in ED Medications  0.9 %  sodium chloride infusion ( Intravenous New Bag/Given 04/05/18 1704)     Initial Impression / Assessment and Plan / ED Course  I have reviewed the triage vital signs and the nursing notes.  Pertinent labs & imaging results that were available during my care of the patient were reviewed by me and considered in my medical decision making (see chart for details).  Clinical Course as of Apr 06 1911  Tue Apr 05, 2018  1062 Discussed findings with patient.  I recommended admission considering his hypotension and bump in creatinine.  Pt has not had any more bloody stools and he feels like it has stopped.  He does not want to be admitted.     [JK]    Clinical Course User Index [JK] Dorie Rank, MD    Patient presented to the emergency room for evaluation of rectal bleeding.  Patient had a colonoscopy last Tuesday.  Patient states he had polyps removed.  Patient had a few years of rectal bleeding today.  Patient was monitored in the emergency room.  He had no further episodes of rectal bleeding.  His hemoglobin is stable.  I did recommend overnight admission and observation considering his Hemoccult positive stools, the transient hypotension and the history he provided.  Patient does not want to be admitted.  He  has to go home to take care of his wife.  Patient did agree to check his blood pressure one more time and he does not have any recurrent hypotension although his heart rate did increase somewhat.  Patient understands to return to the emergency room if he has any recurrent bleeding.  He will contact his GI doctor tomorrow to arrange close follow-up.  I will have him stop his aspirin in the meantime  Final Clinical Impressions(s) / ED Diagnoses   Final diagnoses:  GI bleed  Rectal bleeding    ED Discharge Orders    None       Dorie Rank, MD 04/05/18 6948

## 2018-04-06 DIAGNOSIS — K625 Hemorrhage of anus and rectum: Secondary | ICD-10-CM | POA: Diagnosis not present

## 2018-04-06 LAB — ABO/RH: ABO/RH(D): A NEG

## 2018-04-12 ENCOUNTER — Ambulatory Visit
Admission: RE | Admit: 2018-04-12 | Discharge: 2018-04-12 | Disposition: A | Discharge status: Home / Self Care | Payer: Medicare Other | Source: Ambulatory Visit | Attending: Diagnostic Neuroimaging | Admitting: Diagnostic Neuroimaging

## 2018-04-12 DIAGNOSIS — R413 Other amnesia: Secondary | ICD-10-CM

## 2018-04-14 ENCOUNTER — Telehealth: Payer: Self-pay | Admitting: *Deleted

## 2018-04-14 NOTE — Telephone Encounter (Signed)
LVM informing the patient that his MRI brain showed unremarkable imaging results, mild age related changes, nothing worrisome.  Advised ihm his thyroid and Vit B12 labs are normal. Reviewed Dr Gladstone Lighter plan per office note, left number for any questions.

## 2018-04-20 DIAGNOSIS — Z23 Encounter for immunization: Secondary | ICD-10-CM | POA: Diagnosis not present

## 2018-05-10 DIAGNOSIS — K625 Hemorrhage of anus and rectum: Secondary | ICD-10-CM | POA: Diagnosis not present

## 2018-06-21 ENCOUNTER — Ambulatory Visit (INDEPENDENT_AMBULATORY_CARE_PROVIDER_SITE_OTHER): Payer: Medicare Other | Admitting: Orthopaedic Surgery

## 2018-06-21 ENCOUNTER — Ambulatory Visit (INDEPENDENT_AMBULATORY_CARE_PROVIDER_SITE_OTHER): Payer: Medicare Other

## 2018-06-21 ENCOUNTER — Ambulatory Visit (INDEPENDENT_AMBULATORY_CARE_PROVIDER_SITE_OTHER): Payer: Self-pay

## 2018-06-21 ENCOUNTER — Encounter (INDEPENDENT_AMBULATORY_CARE_PROVIDER_SITE_OTHER): Payer: Self-pay | Admitting: Orthopaedic Surgery

## 2018-06-21 VITALS — BP 154/91 | HR 80 | Ht 70.5 in | Wt 227.0 lb

## 2018-06-21 DIAGNOSIS — M25561 Pain in right knee: Secondary | ICD-10-CM

## 2018-06-21 DIAGNOSIS — M25562 Pain in left knee: Secondary | ICD-10-CM

## 2018-06-21 DIAGNOSIS — M25511 Pain in right shoulder: Secondary | ICD-10-CM

## 2018-06-21 DIAGNOSIS — R29898 Other symptoms and signs involving the musculoskeletal system: Secondary | ICD-10-CM | POA: Diagnosis not present

## 2018-06-21 DIAGNOSIS — M25512 Pain in left shoulder: Secondary | ICD-10-CM

## 2018-06-21 NOTE — Progress Notes (Signed)
Office Visit Note   Patient: Kenneth Harper           Date of Birth: 07/24/1940           MRN: [id number] Visit Date: 06/21/2018              Requested by: Wenda Low, MD [place] [employer] [address] [place], [place] [address] PCP: Wenda Low, MD   Assessment & Plan: Visit Diagnoses:  1. Pain in both knees, unspecified chronicity   2. Left shoulder pain, unspecified chronicity   3. Right shoulder pain, unspecified chronicity   4. Weakness of hand     Plan: Bilateral end-stage osteoarthritis of the knees.  Long discussion regarding diagnosis and treatment options including cortisone, Visco supplementation and knee replacement.  Relph would like to work with exercises and NSAIDs  Having trouble with numbness and tingling of both upper extremities it could be carpal tunnel syndrome will order EMGs and nerve conduction studies and have him follow-up. Office visit over 45 minutes with 50% of the time in counseling regarding all of his extremity problems  Follow-Up Instructions: Return AFTER emg'S ue'S.   Orders:  Orders Placed This Encounter  Procedures  . XR KNEE 3 VIEW RIGHT  . XR KNEE 3 VIEW LEFT  . XR Shoulder Right  . XR Shoulder Left  . Ambulatory referral to Physical Medicine Rehab   No orders of the defined types were placed in this encounter.     Procedures: No procedures performed   Clinical Data: No additional findings.   Subjective: Chief Complaint  Patient presents with  . Right Shoulder - Pain    No strength in hands and arms when he grips and tries to raise it up, with swelling in both hands onset x yrs but recently more so over the last year   . Left Shoulder - Pain  . Right Knee - Pain    Hx of torn cartilage in both knees, does a lot of walking, has to be careful with steps due to either buckling and giving way. Not ready surgery, still works and can't be laid up   . Left Knee - Pain  Kenneth Harper is 77 years old and has a number of  complaints.  He is having some trouble with weakness of both upper extremities.  He is experience some swelling of both of his hands as well as numbness and tingling into the long and ring fingers.  Has had trouble at night and during the day even dropping objects.  Has had some joint discomfort consistent with arthritis.  Sometimes he feels like his upper extremities are "weak".  He has had some discomfort in the area of his shoulders and wished to have x-rays.  He is also experiencing considerable pain in both of his knees with swelling stiffness and occasional sensation of his knees giving way.  He is not having any groin pain or thigh discomfort.  He has tried over-the-counter medicines which provide some temporary relief of his pain.  He is also had some discomfort with weather changes in both of his knees  HPI  Review of Systems   Objective: Vital Signs: BP (!) 154/91 (BP Location: Left Arm, Patient Position: Sitting)   Pulse 80   Ht 5' 10.5" (1.791 m)   Wt 227 lb (103 kg)   BMI 32.11 kg/m   Physical Exam  Constitutional: He is oriented to person, place, and time. He appears well-developed and well-nourished.  HENT:  Mouth/Throat: Oropharynx is clear and moist.  Eyes: Pupils are equal, round, and reactive to light. EOM are normal.  Pulmonary/Chest: Effort normal.  Neurological: He is alert and oriented to person, place, and time.  Skin: Skin is warm and dry.  Psychiatric: He has a normal mood and affect. His behavior is normal.    Ortho Exam awake alert and oriented x3.  Comfortable sitting.  Able to place both arms fully over his head.  Negative impingement and empty can testing.  He does have swelling diffusely of both of his hands but is able to make a fist.  Minimally positive Tinel's over the wrist bilaterally.  Normal sensibility.  Lacks full extension of both knees with increased varus position and small effusions.  Having more medial lateral joint pain.  Patellar  crepitation bilaterally with pain with patella compression.  No distal edema.  Walks without a limp.  Straight leg raise negative.  Painless range of motion both hips  Specialty Comments:  No specialty comments available.  Imaging: Xr Knee 3 View Left  Result Date: 06/21/2018 Films of the left knee were obtained in several projections standing.  There is near complete absence of the medial joint space with about 3 to 4 degrees of varus.  Subchondral cysts and sclerosis identified medially with peripheral osteophytes.  Peripheral osteophytes in the lateral compartment.  No ectopic calcification.  Significant degenerative change of the patellofemoral joint particularly laterally with significant narrowing.  Films are consistent with end-stage osteoarthritis  Xr Knee 3 View Right  Result Date: 06/21/2018 Films of the right knee are obtained in several projections standing.  There are end-stage osteoarthritic changes but to a lesser degree than on the left.  There is near complete collapse of the medial compartment with subchondral cysts and peripheral osteophytes and subchondral sclerosis.  Degenerative changes are also identified the patellofemoral joint particularly laterally where there is narrowing.  Xr Shoulder Left  Result Date: 06/21/2018 Films of the left shoulder obtained in several projections.  Humeral head is centered about the glenoid.  Hypertrophic changes about the acromioclavicular joint with degenerative changes.  Some narrowing of the space between the humeral head and the acromium but no ectopic calcification.  No acute changes  Xr Shoulder Right  Result Date: 06/21/2018 Films of the right shoulder obtained in several projections.  The humeral head is centered about the glenoid.  Normal space between the humeral head and the acromium.  No ectopic calcification.  Some mild cystic changes about the greater tuberosity.  Mild degenerative changes at the acromioclavicular joint.   No acute change    PMFS History: Patient Active Problem List   Diagnosis Date Noted  . Lumbar herniated disc 10/01/2015  . Herniation of lumbar intervertebral disc with radiculopathy 09/30/2015    Class: Acute   Past Medical History:  Diagnosis Date  . Arthritis    knees and back  . Asthma    childhood  . Carpal tunnel syndrome   . Cataract   . Diverticulitis   . GERD (gastroesophageal reflux disease)   . Hypercholesterolemia   . Memory loss     Family History  Problem Relation Age of Onset  . Parkinson's disease Mother   . Heart failure Father   . Diabetes Brother   . Cancer Brother        throat  . Hydrocephalus Brother   . Diabetes Brother   . Migraines Sister     Past Surgical History:  Procedure Laterality Date  .  CATARACT EXTRACTION, BILATERAL Bilateral   . COLONOSCOPY WITH PROPOFOL N/A 10/29/2014   Procedure: COLONOSCOPY WITH PROPOFOL;  Surgeon: Garlan Fair, MD;  Location: [facility];  Service: Endoscopy;  Laterality: N/A;  . EYE SURGERY    . LUMBAR LAMINECTOMY/DECOMPRESSION MICRODISCECTOMY N/A 10/01/2015   Procedure: LEFT L2-3 MICRODISCECTOMY;  Surgeon: Jessy Oto, MD;  Location: [facility];  Service: Orthopedics;  Laterality: N/A;   Social History   Occupational History  . Not on file  Tobacco Use  . Smoking status: Former Smoker    Years: 42.00    Types: Cigars    Last attempt to quit: 10/22/2010    Years since quitting: 7.6  . Smokeless tobacco: Never Used  Substance and Sexual Activity  . Alcohol use: No    Alcohol/week: 0.0 standard drinks  . Drug use: No  . Sexual activity: Never    Birth control/protection: Abstinence     [name], MD   Note - This record has been created using [employer].  Chart creation errors have been sought, but may not always  have been located. Such creation errors do not reflect on  the standard of medical care.

## 2018-07-19 ENCOUNTER — Encounter (INDEPENDENT_AMBULATORY_CARE_PROVIDER_SITE_OTHER): Payer: Self-pay | Admitting: Physical Medicine and Rehabilitation

## 2018-07-19 ENCOUNTER — Ambulatory Visit (INDEPENDENT_AMBULATORY_CARE_PROVIDER_SITE_OTHER): Payer: Medicare Other | Admitting: Physical Medicine and Rehabilitation

## 2018-07-19 DIAGNOSIS — R202 Paresthesia of skin: Secondary | ICD-10-CM | POA: Diagnosis not present

## 2018-07-19 NOTE — Progress Notes (Signed)
Numeric Pain Rating Scale and Functional Assessment Average Pain 8   In the last MONTH (on 0-10 scale) has pain interfered with the following?  1. General activity like being  able to carry out your everyday physical activities such as walking, climbing stairs, carrying groceries, or moving a chair?  Rating(0)     

## 2018-07-22 NOTE — Procedures (Signed)
EMG & NCV Findings: Evaluation of the left median motor and the right median motor nerves showed prolonged distal onset latency (L13.4, R12.6 ms), reduced amplitude (L4.4, R3.2 mV), and decreased conduction velocity (Elbow-Wrist, L36, R34 m/s).  The left median (across palm) sensory nerve showed prolonged distal peak latency (Wrist, 19.3 ms), reduced amplitude (1.1 V), and prolonged distal peak latency (Palm, 8.3 ms).  The right median (across palm) sensory nerve showed no response (Wrist) and prolonged distal peak latency (Palm, 4.6 ms).  The right ulnar sensory nerve showed prolonged distal peak latency (3.9 ms) and decreased conduction velocity (Wrist-5th Digit, 36 m/s).  All remaining nerves (as indicated in the following tables) were within normal limits.  Left vs. Right side comparison data for the median motor nerve indicates abnormal L-R latency difference (0.8 ms).  All remaining left vs. right side differences were within normal limits.    All examined muscles (as indicated in the following table) showed no evidence of electrical instability.    Impression: The above electrodiagnostic study is ABNORMAL and reveals evidence of a severe BILATERAL median nerve entrapment at the wrist (carpal tunnel syndrome) affecting sensory and motor components.  Needle EMG was essentially normal which portends a better outcome from a standpoint of gaining strength back after decompression.  There is no significant electrodiagnostic evidence of any other focal nerve entrapment, brachial plexopathy or cervical radiculopathy.   Recommendations: 1.  Follow-up with referring physician. 2.  Continue current management of symptoms. 3.  Suggest surgical evaluation.  ___________________________ Laurence Spates FAAPMR Board Certified, American Board of Physical Medicine and Rehabilitation    Nerve Conduction Studies Anti Sensory Summary Table   Stim Site NR Peak (ms) Norm Peak (ms) P-T Amp (V) Norm P-T Amp  Site1 Site2 Delta-P (ms) Dist (cm) Vel (m/s) Norm Vel (m/s)  Left Median Acr Palm Anti Sensory (2nd Digit)  32.9C  Wrist    *19.3 <3.6 *1.1 >10 Wrist Palm 11.0 0.0    Palm    *8.3 <2.0 20.5         Right Median Acr Palm Anti Sensory (2nd Digit)  32.6C  Wrist *NR  <3.6  >10 Wrist Palm  0.0    Palm    *4.6 <2.0 33.3         Left Radial Anti Sensory (Base 1st Digit)  33C  Wrist    2.1 <3.1 21.5  Wrist Base 1st Digit 2.1 0.0    Right Radial Anti Sensory (Base 1st Digit)  32C  Wrist    2.3 <3.1 12.1  Wrist Base 1st Digit 2.3 0.0    Left Ulnar Anti Sensory (5th Digit)  33.1C  Wrist    3.7 <3.7 25.2 >15.0 Wrist 5th Digit 3.7 14.0 38 >38  Right Ulnar Anti Sensory (5th Digit)  32.3C  Wrist    *3.9 <3.7 17.2 >15.0 Wrist 5th Digit 3.9 14.0 *36 >38   Motor Summary Table   Stim Site NR Onset (ms) Norm Onset (ms) O-P Amp (mV) Norm O-P Amp Site1 Site2 Delta-0 (ms) Dist (cm) Vel (m/s) Norm Vel (m/s)  Left Median Motor (Abd Poll Brev)  33.1C  Wrist    *13.4 <4.2 *4.4 >5 Elbow Wrist 6.1 22.0 *36 >50  Elbow    19.5  4.0         Right Median Motor (Abd Poll Brev)  31.7C  Wrist    *12.6 <4.2 *3.2 >5 Elbow Wrist 6.5 22.0 *34 >50  Elbow    19.1  2.6  Left Ulnar Motor (Abd Dig Min)  33.2C  Wrist    3.1 <4.2 7.2 >3 B Elbow Wrist 4.0 21.0 53 >53  B Elbow    7.1  6.7  A Elbow B Elbow 1.3 10.0 77 >53  A Elbow    8.4  6.3         Right Ulnar Motor (Abd Dig Min)  31.6C  Wrist    3.4 <4.2 9.2 >3 B Elbow Wrist 3.9 21.5 55 >53  B Elbow    7.3  8.0  A Elbow B Elbow 1.6 10.0 62 >53  A Elbow    8.9  7.5          EMG   Side Muscle Nerve Root Ins Act Fibs Psw Amp Dur Poly Recrt Int Fraser Din Comment  Left Abd Poll Brev Median C8-T1 Nml Nml Nml Nml Nml 0 Nml Nml   Left 1stDorInt Ulnar C8-T1 Nml Nml Nml Nml Nml 0 Nml Nml   Left PronatorTeres Median C6-7 Nml Nml Nml Nml Nml 0 Nml Nml   Left Biceps Musculocut C5-6 Nml Nml Nml Nml Nml 0 Nml Nml   Left Deltoid Axillary C5-6 Nml Nml Nml Nml Nml 0 Nml Nml      Nerve Conduction Studies Anti Sensory Left/Right Comparison   Stim Site L Lat (ms) R Lat (ms) L-R Lat (ms) L Amp (V) R Amp (V) L-R Amp (%) Site1 Site2 L Vel (m/s) R Vel (m/s) L-R Vel (m/s)  Median Acr Palm Anti Sensory (2nd Digit)  32.9C  Wrist *19.3   *1.1   Wrist Palm     Palm *8.3 *4.6 3.7 20.5 33.3 38.4       Radial Anti Sensory (Base 1st Digit)  33C  Wrist 2.1 2.3 0.2 21.5 12.1 43.7 Wrist Base 1st Digit     Ulnar Anti Sensory (5th Digit)  33.1C  Wrist 3.7 *3.9 0.2 25.2 17.2 31.7 Wrist 5th Digit 38 *36 2   Motor Left/Right Comparison   Stim Site L Lat (ms) R Lat (ms) L-R Lat (ms) L Amp (mV) R Amp (mV) L-R Amp (%) Site1 Site2 L Vel (m/s) R Vel (m/s) L-R Vel (m/s)  Median Motor (Abd Poll Brev)  33.1C  Wrist *13.4 *12.6 *0.8 *4.4 *3.2 27.3 Elbow Wrist *36 *34 2  Elbow 19.5 19.1 0.4 4.0 2.6 35.0       Ulnar Motor (Abd Dig Min)  33.2C  Wrist 3.1 3.4 0.3 7.2 9.2 21.7 B Elbow Wrist 53 55 2  B Elbow 7.1 7.3 0.2 6.7 8.0 16.2 A Elbow B Elbow 77 62 15  A Elbow 8.4 8.9 0.5 6.3 7.5 16.0          Waveforms:

## 2018-07-22 NOTE — Progress Notes (Signed)
Kenneth Harper - 78 y.o. male MRN [id number]  Date of birth: 04-16-1941  Office Visit Note: Visit Date: 07/19/2018 PCP: Wenda Low, MD Referred by: Wenda Low, MD  Subjective: Chief Complaint  Patient presents with  . Right Hand - Edema, Numbness  . Left Hand - Edema, Numbness   HPI: Kenneth Harper is a 78 y.o. male who comes in today At the request of Dr. Joni Fears for electrodiagnostic study of both upper limbs.  I have actually seen the patient remotely for lumbar disc herniation which he ultimately had discectomy and did quite well.  He comes in today with bilateral hand tingling and what he refers to a swelling of his fingers.  He reports most of his symptoms in the middle digits the third and fourth digits bilaterally.  He has been sleeping with gloves on that he feels like helps the tingling.  He does report nocturnal complaints of worsening tingling at night.  He feels like both hands are equal but he is left-hand dominant.  He reports doing a lot of writing will also increase his symptoms.  He gets more tingling numbness with driving and holding the phone.  He has not described any radicular symptoms or neck pain.  He has not had any focal weakness.  No specific trauma.  No prior electrodiagnostic studies.  He has been evaluated and seen recently with Dr. Leta Baptist at [facility] neurology but this was more for memory issues.  ROS Otherwise per HPI.  Assessment & Plan: Visit Diagnoses:  1. Paresthesia of skin     Plan: Impression: The above electrodiagnostic study is ABNORMAL and reveals evidence of a severe BILATERAL median nerve entrapment at the wrist (carpal tunnel syndrome) affecting sensory and motor components.  Needle EMG was essentially normal which portends a better outcome from a standpoint of gaining strength back after decompression.  There is no significant electrodiagnostic evidence of any other focal nerve entrapment, brachial plexopathy or cervical  radiculopathy.   Recommendations: 1.  Follow-up with referring physician. 2.  Continue current management of symptoms. 3.  Suggest surgical evaluation.    Meds & Orders: No orders of the defined types were placed in this encounter.   Orders Placed This Encounter  Procedures  . NCV with EMG (electromyography)    Follow-up: Return for Joni Fears, MD.   Procedures: No procedures performed  EMG & NCV Findings: Evaluation of the left median motor and the right median motor nerves showed prolonged distal onset latency (L13.4, R12.6 ms), reduced amplitude (L4.4, R3.2 mV), and decreased conduction velocity (Elbow-Wrist, L36, R34 m/s).  The left median (across palm) sensory nerve showed prolonged distal peak latency (Wrist, 19.3 ms), reduced amplitude (1.1 V), and prolonged distal peak latency (Palm, 8.3 ms).  The right median (across palm) sensory nerve showed no response (Wrist) and prolonged distal peak latency (Palm, 4.6 ms).  The right ulnar sensory nerve showed prolonged distal peak latency (3.9 ms) and decreased conduction velocity (Wrist-5th Digit, 36 m/s).  All remaining nerves (as indicated in the following tables) were within normal limits.  Left vs. Right side comparison data for the median motor nerve indicates abnormal L-R latency difference (0.8 ms).  All remaining left vs. right side differences were within normal limits.    All examined muscles (as indicated in the following table) showed no evidence of electrical instability.    Impression: The above electrodiagnostic study is ABNORMAL and reveals evidence of a severe BILATERAL median nerve entrapment at the wrist (  carpal tunnel syndrome) affecting sensory and motor components.  Needle EMG was essentially normal which portends a better outcome from a standpoint of gaining strength back after decompression.  There is no significant electrodiagnostic evidence of any other focal nerve entrapment, brachial plexopathy or cervical  radiculopathy.   Recommendations: 1.  Follow-up with referring physician. 2.  Continue current management of symptoms. 3.  Suggest surgical evaluation.  ___________________________ Laurence Spates FAAPMR Board Certified, American Board of Physical Medicine and Rehabilitation    Nerve Conduction Studies Anti Sensory Summary Table   Stim Site NR Peak (ms) Norm Peak (ms) P-T Amp (V) Norm P-T Amp Site1 Site2 Delta-P (ms) Dist (cm) Vel (m/s) Norm Vel (m/s)  Left Median Acr Palm Anti Sensory (2nd Digit)  32.9C  Wrist    *19.3 <3.6 *1.1 >10 Wrist Palm 11.0 0.0    Palm    *8.3 <2.0 20.5         Right Median Acr Palm Anti Sensory (2nd Digit)  32.6C  Wrist *NR  <3.6  >10 Wrist Palm  0.0    Palm    *4.6 <2.0 33.3         Left Radial Anti Sensory (Base 1st Digit)  33C  Wrist    2.1 <3.1 21.5  Wrist Base 1st Digit 2.1 0.0    Right Radial Anti Sensory (Base 1st Digit)  32C  Wrist    2.3 <3.1 12.1  Wrist Base 1st Digit 2.3 0.0    Left Ulnar Anti Sensory (5th Digit)  33.1C  Wrist    3.7 <3.7 25.2 >15.0 Wrist 5th Digit 3.7 14.0 38 >38  Right Ulnar Anti Sensory (5th Digit)  32.3C  Wrist    *3.9 <3.7 17.2 >15.0 Wrist 5th Digit 3.9 14.0 *36 >38   Motor Summary Table   Stim Site NR Onset (ms) Norm Onset (ms) O-P Amp (mV) Norm O-P Amp Site1 Site2 Delta-0 (ms) Dist (cm) Vel (m/s) Norm Vel (m/s)  Left Median Motor (Abd Poll Brev)  33.1C  Wrist    *13.4 <4.2 *4.4 >5 Elbow Wrist 6.1 22.0 *36 >50  Elbow    19.5  4.0         Right Median Motor (Abd Poll Brev)  31.7C  Wrist    *12.6 <4.2 *3.2 >5 Elbow Wrist 6.5 22.0 *34 >50  Elbow    19.1  2.6         Left Ulnar Motor (Abd Dig Min)  33.2C  Wrist    3.1 <4.2 7.2 >3 B Elbow Wrist 4.0 21.0 53 >53  B Elbow    7.1  6.7  A Elbow B Elbow 1.3 10.0 77 >53  A Elbow    8.4  6.3         Right Ulnar Motor (Abd Dig Min)  31.6C  Wrist    3.4 <4.2 9.2 >3 B Elbow Wrist 3.9 21.5 55 >53  B Elbow    7.3  8.0  A Elbow B Elbow 1.6 10.0 62 >53  A Elbow    8.9   7.5          EMG   Side Muscle Nerve Root Ins Act Fibs Psw Amp Dur Poly Recrt Int Fraser Din Comment  Left Abd Poll Brev Median C8-T1 Nml Nml Nml Nml Nml 0 Nml Nml   Left 1stDorInt Ulnar C8-T1 Nml Nml Nml Nml Nml 0 Nml Nml   Left PronatorTeres Median C6-7 Nml Nml Nml Nml Nml 0 Nml Nml   Left Biceps Musculocut  C5-6 Nml Nml Nml Nml Nml 0 Nml Nml   Left Deltoid Axillary C5-6 Nml Nml Nml Nml Nml 0 Nml Nml     Nerve Conduction Studies Anti Sensory Left/Right Comparison   Stim Site L Lat (ms) R Lat (ms) L-R Lat (ms) L Amp (V) R Amp (V) L-R Amp (%) Site1 Site2 L Vel (m/s) R Vel (m/s) L-R Vel (m/s)  Median Acr Palm Anti Sensory (2nd Digit)  32.9C  Wrist *19.3   *1.1   Wrist Palm     Palm *8.3 *4.6 3.7 20.5 33.3 38.4       Radial Anti Sensory (Base 1st Digit)  33C  Wrist 2.1 2.3 0.2 21.5 12.1 43.7 Wrist Base 1st Digit     Ulnar Anti Sensory (5th Digit)  33.1C  Wrist 3.7 *3.9 0.2 25.2 17.2 31.7 Wrist 5th Digit 38 *36 2   Motor Left/Right Comparison   Stim Site L Lat (ms) R Lat (ms) L-R Lat (ms) L Amp (mV) R Amp (mV) L-R Amp (%) Site1 Site2 L Vel (m/s) R Vel (m/s) L-R Vel (m/s)  Median Motor (Abd Poll Brev)  33.1C  Wrist *13.4 *12.6 *0.8 *4.4 *3.2 27.3 Elbow Wrist *36 *34 2  Elbow 19.5 19.1 0.4 4.0 2.6 35.0       Ulnar Motor (Abd Dig Min)  33.2C  Wrist 3.1 3.4 0.3 7.2 9.2 21.7 B Elbow Wrist 53 55 2  B Elbow 7.1 7.3 0.2 6.7 8.0 16.2 A Elbow B Elbow 77 62 15  A Elbow 8.4 8.9 0.5 6.3 7.5 16.0          Waveforms:                     Clinical History: No specialty comments available.   He reports that he quit smoking about 7 years ago. His smoking use included cigars. He quit after 42.00 years of use. He has never used smokeless tobacco. No results for input(s): HGBA1C, LABURIC in the last 8760 hours.  Objective:  VS:  HT:    WT:   BMI:     BP:   HR: bpm  TEMP: ( )  RESP:  Physical Exam Musculoskeletal:        General: No swelling or tenderness.     Comments: Inspection  reveals no atrophy of the bilateral APB or FDI or hand intrinsics. There is no swelling, color changes, allodynia or dystrophic changes. There is 5 out of 5 strength in the bilateral wrist extension, finger abduction and long finger flexion.  There is patchy decreased sensation to light touch and mostly median nerve distribution bilaterally. There is a negative Froment's test bilaterally. There is a negative Hoffmann's test bilaterally.  Skin:    General: Skin is warm and dry.     Findings: No erythema or rash.  Neurological:     Mental Status: He is alert and oriented to person, place, and time.     Motor: No abnormal muscle tone.     Coordination: Coordination normal.  Psychiatric:        Mood and Affect: Mood normal.        Behavior: Behavior normal.     Ortho Exam Imaging: No results found.  Past Medical/Family/Surgical/Social History: Medications & Allergies reviewed per EMR, new medications updated. Patient Active Problem List   Diagnosis Date Noted  . Lumbar herniated disc 10/01/2015  . Herniation of lumbar intervertebral disc with radiculopathy 09/30/2015    Class: Acute   Past Medical  History:  Diagnosis Date  . Arthritis    knees and back  . Asthma    childhood  . Carpal tunnel syndrome   . Cataract   . Diverticulitis   . GERD (gastroesophageal reflux disease)   . Hypercholesterolemia   . Memory loss    Family History  Problem Relation Age of Onset  . Parkinson's disease Mother   . Heart failure Father   . Diabetes Brother   . Cancer Brother        throat  . Hydrocephalus Brother   . Diabetes Brother   . Migraines Sister    Past Surgical History:  Procedure Laterality Date  . CATARACT EXTRACTION, BILATERAL Bilateral   . COLONOSCOPY WITH PROPOFOL N/A 10/29/2014   Procedure: COLONOSCOPY WITH PROPOFOL;  Surgeon: Garlan Fair, MD;  Location: [facility];  Service: Endoscopy;  Laterality: N/A;  . EYE SURGERY    . LUMBAR LAMINECTOMY/DECOMPRESSION  MICRODISCECTOMY N/A 10/01/2015   Procedure: LEFT L2-3 MICRODISCECTOMY;  Surgeon: Jessy Oto, MD;  Location: [place];  Service: Orthopedics;  Laterality: N/A;   Social History   Occupational History  . Not on file  Tobacco Use  . Smoking status: Former Smoker    Years: 42.00    Types: Cigars    Last attempt to quit: 10/22/2010    Years since quitting: 7.7  . Smokeless tobacco: Never Used  Substance and Sexual Activity  . Alcohol use: No    Alcohol/week: 0.0 standard drinks  . Drug use: No  . Sexual activity: Never    Birth control/protection: Abstinence

## 2018-07-28 ENCOUNTER — Ambulatory Visit (INDEPENDENT_AMBULATORY_CARE_PROVIDER_SITE_OTHER): Payer: Medicare Other | Admitting: Orthopaedic Surgery

## 2018-07-28 ENCOUNTER — Encounter (INDEPENDENT_AMBULATORY_CARE_PROVIDER_SITE_OTHER): Payer: Self-pay | Admitting: Orthopaedic Surgery

## 2018-07-28 VITALS — BP 160/94 | HR 86

## 2018-07-28 DIAGNOSIS — G5603 Carpal tunnel syndrome, bilateral upper limbs: Secondary | ICD-10-CM | POA: Diagnosis not present

## 2018-07-28 NOTE — Progress Notes (Signed)
Office Visit Note   Patient: Kenneth Harper           Date of Birth: 1940/10/10           MRN: [id number] Visit Date: 07/28/2018              Requested by: Wenda Low, MD [place] [address] [place],  [address] PCP: Wenda Low, MD   Assessment & Plan: Visit Diagnoses:  1. Carpal tunnel syndrome on both sides     Plan: [name] had EMGs nerve conduction studies by Dr. Ernestina Patches demonstrating severe bilateral median nerve entrapment at the wrist.  We had a long discussion regarding the diagnosis.  [name] would like to proceed with surgical release of the more painful left wrist.  Discussed this in detail with him and what he may expect postoperatively  Follow-Up Instructions: Return will schedule surgery left wrist.   Orders:  No orders of the defined types were placed in this encounter.  No orders of the defined types were placed in this encounter.     Procedures: No procedures performed   Clinical Data: No additional findings.   Subjective: Chief Complaint  Patient presents with  . Left Hand - Follow-up  Returns to the office today to discuss nerve conduction studies and EMGs performed by Dr. Ernestina Patches.  [name] has had a chronic problem with numbness and tingling in both of his hands at night, when he drives his car when he does any repetitive activity.  Clinically he has bilateral carpal tunnel syndrome.  Nerve conduction studies demonstrate severe bilateral median nerve entrapment at the wrist  HPI  Review of Systems  Constitutional: Negative.   HENT: Negative.   Eyes: Negative.   Respiratory: Negative.   Cardiovascular: Negative.   Gastrointestinal: Negative.   Genitourinary: Negative.   Musculoskeletal: Negative.   Skin: Negative.   Neurological: Negative.   Hematological: Negative.   Psychiatric/Behavioral: Negative.      Objective: Vital Signs: BP (!) 160/94   Pulse 86   Physical Exam Constitutional:    Appearance: He is well-developed.  Eyes:     Pupils: Pupils are equal, round, and reactive to light.  Pulmonary:     Effort: Pulmonary effort is normal.  Skin:    General: Skin is warm and dry.  Neurological:     Mental Status: He is alert and oriented to person, place, and time.  Psychiatric:        Behavior: Behavior normal.     Ortho Exam awake alert and oriented x3.  Comfortable sitting positive Tinel's and Phalen's both wrists.  Good capillary refill to fingers.  No edema.  Able to oppose thumb to little finger bi laterally  Specialty Comments:  No specialty comments available.  Imaging: No results found.   PMFS History: Patient Active Problem List   Diagnosis Date Noted  . Carpal tunnel syndrome on both sides 07/28/2018  . Lumbar herniated disc 10/01/2015  . Herniation of lumbar intervertebral disc with radiculopathy 09/30/2015    Class: Acute   Past Medical History:  Diagnosis Date  . Arthritis    knees and back  . Asthma    childhood  . Carpal tunnel syndrome   . Cataract   . Diverticulitis   . GERD (gastroesophageal reflux disease)   . Hypercholesterolemia   . Memory loss     Family History  Problem Relation Age of Onset  . Parkinson's disease Mother   . Heart failure Father   .  Diabetes Brother   . Cancer Brother        throat  . Hydrocephalus Brother   . Diabetes Brother   . Migraines Sister     Past Surgical History:  Procedure Laterality Date  . CATARACT EXTRACTION, BILATERAL Bilateral   . COLONOSCOPY WITH PROPOFOL N/A 10/29/2014   Procedure: COLONOSCOPY WITH PROPOFOL;  Surgeon: Garlan Fair, MD;  Location: [facility];  Service: Endoscopy;  Laterality: N/A;  . EYE SURGERY    . LUMBAR LAMINECTOMY/DECOMPRESSION MICRODISCECTOMY N/A 10/01/2015   Procedure: LEFT L2-3 MICRODISCECTOMY;  Surgeon: Jessy Oto, MD;  Location: [place];  Service: Orthopedics;  Laterality: N/A;   Social History   Occupational History  . Not on file  Tobacco Use    . Smoking status: Former Smoker    Years: 42.00    Types: Cigars    Last attempt to quit: 10/22/2010    Years since quitting: 7.7  . Smokeless tobacco: Never Used  Substance and Sexual Activity  . Alcohol use: No    Alcohol/week: 0.0 standard drinks  . Drug use: No  . Sexual activity: Never    Birth control/protection: Abstinence      Lt hand discuss surgery

## 2018-08-11 ENCOUNTER — Other Ambulatory Visit (INDEPENDENT_AMBULATORY_CARE_PROVIDER_SITE_OTHER): Payer: Self-pay | Admitting: Orthopedic Surgery

## 2018-08-11 DIAGNOSIS — G5602 Carpal tunnel syndrome, left upper limb: Secondary | ICD-10-CM | POA: Diagnosis not present

## 2018-08-11 MED ORDER — OXYCODONE HCL 5 MG PO TABS: 5.0000 mg | ORAL_TABLET | ORAL | 0 refills | Status: DC | PRN

## 2018-08-15 ENCOUNTER — Encounter (INDEPENDENT_AMBULATORY_CARE_PROVIDER_SITE_OTHER): Payer: Self-pay | Admitting: Orthopaedic Surgery

## 2018-08-15 ENCOUNTER — Ambulatory Visit (INDEPENDENT_AMBULATORY_CARE_PROVIDER_SITE_OTHER): Payer: Medicare Other | Admitting: Orthopaedic Surgery

## 2018-08-15 VITALS — BP 132/82 | HR 86 | Ht 70.0 in | Wt 220.0 lb

## 2018-08-15 DIAGNOSIS — G5602 Carpal tunnel syndrome, left upper limb: Secondary | ICD-10-CM | POA: Diagnosis not present

## 2018-08-15 DIAGNOSIS — G5603 Carpal tunnel syndrome, bilateral upper limbs: Secondary | ICD-10-CM

## 2018-08-15 NOTE — Progress Notes (Signed)
Office Visit Note   Patient: Kenneth Harper           Date of Birth: 21-Jul-1940           MRN: [id number] Visit Date: 08/15/2018              Requested by: Wenda Low, MD [address] [place], [place] [address] PCP: Wenda Low, MD   Assessment & Plan: Visit Diagnoses:  1. Carpal tunnel syndrome on both sides     Plan: 4 days status post left carpal tunnel release and doing quite well.  Already can tell a difference in the feeling in all of his fingers with less pain.  Has not had to wake up at night.  Clean the dressing applied waterproof Band-Aid and a volar wrist splint.  Will check in a week to remove stitches Follow-Up Instructions: Return in about 1 week (around 08/22/2018).   Orders:  No orders of the defined types were placed in this encounter.  No orders of the defined types were placed in this encounter.     Procedures: No procedures performed   Clinical Data: No additional findings.   Subjective: No chief complaint on file.  4 days status post left carpal tunnel release and doing quite well. HPI  Review of Systems   Objective: Vital Signs: BP 132/82 (BP Location: Right Arm, Patient Position: Sitting, Cuff Size: Normal)   Pulse 86   Ht 5\' 10"  (1.778 m)   Wt 220 lb (99.8 kg)   BMI 31.57 kg/m   Physical Exam  Ortho Exam left carpal tunnel incision healing without problem.  Neurologically intact.  Good sensation to fingers and good opposition of thumb to little finger improved Band-Aid and re-evaluate in 1 week  Specialty Comments:  No specialty comments available.  Imaging: No results found.   PMFS History: Patient Active Problem List   Diagnosis Date Noted  . Carpal tunnel syndrome on both sides 07/28/2018  . Lumbar herniated disc 10/01/2015  . Herniation of lumbar intervertebral disc with radiculopathy 09/30/2015    Class: Acute   Past Medical History:  Diagnosis Date  . Arthritis    knees and back  . Asthma    childhood  . Carpal tunnel syndrome   . Cataract   . Diverticulitis   . GERD (gastroesophageal reflux disease)   . Hypercholesterolemia   . Memory loss     Family History  Problem Relation Age of Onset  . Parkinson's disease Mother   . Heart failure Father   . Diabetes Brother   . Cancer Brother        throat  . Hydrocephalus Brother   . Diabetes Brother   . Migraines Sister     Past Surgical History:  Procedure Laterality Date  . CATARACT EXTRACTION, BILATERAL Bilateral   . COLONOSCOPY WITH PROPOFOL N/A 10/29/2014   Procedure: COLONOSCOPY WITH PROPOFOL;  Surgeon: Garlan Fair, MD;  [facility];  Service: Endoscopy;  Laterality: N/A;  . EYE SURGERY    . LUMBAR LAMINECTOMY/DECOMPRESSION MICRODISCECTOMY N/A 10/01/2015   Procedure: LEFT L2-3 MICRODISCECTOMY;  Surgeon: Jessy Oto, MD;  Location: [place];  Service: Orthopedics;  Laterality: N/A;   Social History   Occupational History  . Not on file  Tobacco Use  . Smoking status: Former Smoker    Years: 42.00    Types: Cigars    Last attempt to quit: 10/22/2010    Years since quitting: 7.8  . Smokeless tobacco:  Never Used  Substance and Sexual Activity  . Alcohol use: No    Alcohol/week: 0.0 standard drinks  . Drug use: No  . Sexual activity: Never    Birth control/protection: Abstinence     [name], MD   Note - This record has been created using [employer].  Chart creation errors have been sought, but may not always  have been located. Such creation errors do not reflect on  the standard of medical care.

## 2018-08-18 ENCOUNTER — Inpatient Hospital Stay (INDEPENDENT_AMBULATORY_CARE_PROVIDER_SITE_OTHER): Payer: Medicare Other | Admitting: Orthopaedic Surgery

## 2018-08-23 ENCOUNTER — Encounter (INDEPENDENT_AMBULATORY_CARE_PROVIDER_SITE_OTHER): Payer: Self-pay | Admitting: Orthopaedic Surgery

## 2018-08-23 ENCOUNTER — Ambulatory Visit (INDEPENDENT_AMBULATORY_CARE_PROVIDER_SITE_OTHER): Payer: Medicare Other | Admitting: Orthopaedic Surgery

## 2018-08-23 VITALS — BP 155/84 | HR 83 | Ht 70.0 in | Wt 220.0 lb

## 2018-08-23 DIAGNOSIS — G5603 Carpal tunnel syndrome, bilateral upper limbs: Secondary | ICD-10-CM

## 2018-08-23 DIAGNOSIS — Z48811 Encounter for surgical aftercare following surgery on the nervous system: Secondary | ICD-10-CM

## 2018-08-23 DIAGNOSIS — Z9889 Other specified postprocedural states: Secondary | ICD-10-CM

## 2018-08-23 DIAGNOSIS — Z4802 Encounter for removal of sutures: Secondary | ICD-10-CM

## 2018-08-23 NOTE — Progress Notes (Signed)
Office Visit Note   Patient: Kenneth Harper           Date of Birth: Dec 30, 1940           MRN: [id number] Visit Date: 08/23/2018              Requested by: Wenda Low, MD [place] [address] [place], [place] [address] PCP: Wenda Low, MD   Assessment & Plan: Visit Diagnoses:  1. Carpal tunnel syndrome on both sides     Plan: 11 days status post left carpal tunnel release.  The wound looks like it is macerated and has been wet.  I remove the stitches the wound is together there is no drainage and I do not think it is infected reapplied a new Band-Aid with mupropicin like to see him again in 2 days to continue with a volar wrist splint.  He is doing very well otherwise with little if any pain and excellent return of the sensation in the fingers Follow-Up Instructions: Return in about 2 days (around 08/25/2018).   Orders:  No orders of the defined types were placed in this encounter.  No orders of the defined types were placed in this encounter.     Procedures: No procedures performed   Clinical Data: No additional findings.   Subjective: Chief Complaint  Patient presents with  . Left Wrist - Routine Post Op  Patient presents today for 1 week follow up. He is not 12days out from left carpal tunnel release.  DOS 08/11/2018. Patient has been using a volar wrist splint. Patient states that he does not have any pain. He has noticed some drainage and has been keeping it covered with a bandaid.  HPI  Review of Systems   Objective: Vital Signs: BP (!) 155/84   Pulse 83   Ht 5\' 10"  (1.778 m)   Wt 220 lb (99.8 kg)   BMI 31.57 kg/m   Physical Exam  Ortho Exam left carpal tunnel incision has been macerated.  There is induration and redness.  I removed the stitches the wound stayed together.  There is no red streaking and only erythema in the area of the incision.I think it is related to the fact that it it has been wet but apply a dressing with mupropicin.Marland Kitchensee  him in 2 days.  No fever chills or evidence of any migration of erythema  Specialty Comments:  No specialty comments available.  Imaging: No results found.   PMFS History: Patient Active Problem List   Diagnosis Date Noted  . Carpal tunnel syndrome on both sides 07/28/2018  . Lumbar herniated disc 10/01/2015  . Herniation of lumbar intervertebral disc with radiculopathy 09/30/2015    Class: Acute   Past Medical History:  Diagnosis Date  . Arthritis    knees and back  . Asthma    childhood  . Carpal tunnel syndrome   . Cataract   . Diverticulitis   . GERD (gastroesophageal reflux disease)   . Hypercholesterolemia   . Memory loss     Family History  Problem Relation Age of Onset  . Parkinson's disease Mother   . Heart failure Father   . Diabetes Brother   . Cancer Brother        throat  . Hydrocephalus Brother   . Diabetes Brother   . Migraines Sister     Past Surgical History:  Procedure Laterality Date  . CATARACT EXTRACTION, BILATERAL Bilateral   . COLONOSCOPY WITH PROPOFOL N/A 10/29/2014  Procedure: COLONOSCOPY WITH PROPOFOL;  Surgeon: Garlan Fair, MD;  Location: [facility];  Service: Endoscopy;  Laterality: N/A;  . EYE SURGERY    . LUMBAR LAMINECTOMY/DECOMPRESSION MICRODISCECTOMY N/A 10/01/2015   Procedure: LEFT L2-3 MICRODISCECTOMY;  Surgeon: Jessy Oto, MD;  Location: [place];  Service: Orthopedics;  Laterality: N/A;   Social History   Occupational History  . Not on file  Tobacco Use  . Smoking status: Former Smoker    Years: 42.00    Types: Cigars    Last attempt to quit: 10/22/2010    Years since quitting: 7.8  . Smokeless tobacco: Never Used  Substance and Sexual Activity  . Alcohol use: No    Alcohol/week: 0.0 standard drinks  . Drug use: No  . Sexual activity: Never    Birth control/protection: Abstinence

## 2018-08-29 ENCOUNTER — Encounter (INDEPENDENT_AMBULATORY_CARE_PROVIDER_SITE_OTHER): Payer: Self-pay | Admitting: Orthopaedic Surgery

## 2018-08-29 ENCOUNTER — Ambulatory Visit (INDEPENDENT_AMBULATORY_CARE_PROVIDER_SITE_OTHER): Payer: Medicare Other | Admitting: Orthopaedic Surgery

## 2018-08-29 VITALS — BP 148/87 | HR 82 | Ht 70.0 in | Wt 220.0 lb

## 2018-08-29 DIAGNOSIS — Z48811 Encounter for surgical aftercare following surgery on the nervous system: Secondary | ICD-10-CM

## 2018-08-29 DIAGNOSIS — G5603 Carpal tunnel syndrome, bilateral upper limbs: Secondary | ICD-10-CM

## 2018-08-29 NOTE — Progress Notes (Signed)
Office Visit Note   Patient: Kenneth Harper           Date of Birth: Apr 28, 1941           MRN: [id number] Visit Date: 08/29/2018              Requested by: Wenda Low, MD [facility] [place] [address], [place] [address] PCP: Wenda Low, MD   Assessment & Plan: Visit Diagnoses:  1. Carpal tunnel syndrome on both sides     Plan: 2-1/2-week status post left carpal tunnel release and doing well.  He did get the incision wet the first week and there was some maceration of the wound but it looks fine at present.  Not having any pain.  Good grip and release.  Neurologically intact.  Will wear the volar wrist splint and return in 2 weeks.  At some point he like to schedule a release on the right  Follow-Up Instructions: Return in about 2 weeks (around 09/12/2018).   Orders:  No orders of the defined types were placed in this encounter.  No orders of the defined types were placed in this encounter.     Procedures: No procedures performed   Clinical Data: No additional findings.   Subjective: Chief Complaint  Patient presents with  . Left Wrist - Routine Post Op    Left carpal tunnel release 08/11/18  Patient presents today for follow up on his left carpal tunnel release. He is here for an incision check and states that is is looking better. DOS 08/11/18. He has been wearing his volar splint. He takes Aleve as needed for his knees.   HPI  Review of Systems   Objective: Vital Signs: BP (!) 148/87   Pulse 82   Ht 5\' 10"  (1.778 m)   Wt 220 lb (99.8 kg)   BMI 31.57 kg/m   Physical Exam  Ortho Exam left carpal tunnel incision healing without problem.  Edges are pink but not red.  No specific pain.  Wound is closed able to make a full fist and release.  Able to oppose thumb to little finger without a problem.  Neurologically intact.  Good capillary refill to fingers  Specialty Comments:  No specialty comments available.  Imaging: No results found.   PMFS  History: Patient Active Problem List   Diagnosis Date Noted  . Carpal tunnel syndrome on both sides 07/28/2018  . Lumbar herniated disc 10/01/2015  . Herniation of lumbar intervertebral disc with radiculopathy 09/30/2015    Class: Acute   Past Medical History:  Diagnosis Date  . Arthritis    knees and back  . Asthma    childhood  . Carpal tunnel syndrome   . Cataract   . Diverticulitis   . GERD (gastroesophageal reflux disease)   . Hypercholesterolemia   . Memory loss     Family History  Problem Relation Age of Onset  . Parkinson's disease Mother   . Heart failure Father   . Diabetes Brother   . Cancer Brother        throat  . Hydrocephalus Brother   . Diabetes Brother   . Migraines Sister     Past Surgical History:  Procedure Laterality Date  . CATARACT EXTRACTION, BILATERAL Bilateral   . COLONOSCOPY WITH PROPOFOL N/A 10/29/2014   Procedure: COLONOSCOPY WITH PROPOFOL;  Surgeon: Garlan Fair, MD;  Location: [facility];  Service: Endoscopy;  Laterality: N/A;  . EYE SURGERY    . LUMBAR LAMINECTOMY/DECOMPRESSION  MICRODISCECTOMY N/A 10/01/2015   Procedure: LEFT L2-3 MICRODISCECTOMY;  Surgeon: Jessy Oto, MD;  Location: [facility];  Service: Orthopedics;  Laterality: N/A;   Social History   Occupational History  . Not on file  Tobacco Use  . Smoking status: Former Smoker    Years: 42.00    Types: Cigars    Last attempt to quit: 10/22/2010    Years since quitting: 7.8  . Smokeless tobacco: Never Used  Substance and Sexual Activity  . Alcohol use: No    Alcohol/week: 0.0 standard drinks  . Drug use: No  . Sexual activity: Never    Birth control/protection: Abstinence

## 2018-09-13 DIAGNOSIS — H6243 Otitis externa in other diseases classified elsewhere, bilateral: Secondary | ICD-10-CM | POA: Diagnosis not present

## 2018-09-13 DIAGNOSIS — B369 Superficial mycosis, unspecified: Secondary | ICD-10-CM | POA: Diagnosis not present

## 2018-09-13 DIAGNOSIS — Z87891 Personal history of nicotine dependence: Secondary | ICD-10-CM | POA: Diagnosis not present

## 2018-09-14 ENCOUNTER — Ambulatory Visit (INDEPENDENT_AMBULATORY_CARE_PROVIDER_SITE_OTHER): Payer: Medicare Other | Admitting: Orthopaedic Surgery

## 2018-09-14 ENCOUNTER — Encounter (INDEPENDENT_AMBULATORY_CARE_PROVIDER_SITE_OTHER): Payer: Self-pay | Admitting: Orthopaedic Surgery

## 2018-09-14 VITALS — BP 154/88 | HR 80 | Ht 70.0 in | Wt 220.0 lb

## 2018-09-14 DIAGNOSIS — G5603 Carpal tunnel syndrome, bilateral upper limbs: Secondary | ICD-10-CM

## 2018-09-14 NOTE — Progress Notes (Signed)
Office Visit Note   Patient: Kenneth Harper           Date of Birth: [birth year]           MRN: [id number] Visit Date: 09/14/2018              Requested by: Wenda Low, MD [facility] [place] [address], [place] [address] PCP: Wenda Low, MD   Assessment & Plan: Visit Diagnoses:  1. Carpal tunnel syndrome on both sides     Plan: Status post left carpal tunnel release over the past several weeks and doing well.  Initially had some issues with the wound but it has healed without further problem.  No longer has any the symptoms that he had preoperatively.  Kenneth Harper would like to proceed towards the end of the month or early April with carpal tunnel release on the right  Follow-Up Instructions: No follow-ups on file.   Orders:  No orders of the defined types were placed in this encounter.  No orders of the defined types were placed in this encounter.     Procedures: No procedures performed   Clinical Data: No additional findings.   Subjective: Chief Complaint  Patient presents with  . Left Wrist - Routine Post Op    Left carpal tunnel release DOS 08/11/2018  Patient presents today one month out from surgery. He had left carpal tunnel release on 08/11/2018. He stopped wearing the volar splint at his last visit. No complaints.   HPI  Review of Systems   Objective: Vital Signs: BP (!) 154/88   Pulse 80   Ht 5\' 10"  (1.778 m)   Wt 220 lb (99.8 kg)   BMI 31.57 kg/m   Physical Exam  Ortho Exam left carpal tunnel incision is healed without further problem.  There is some induration as expected and he can massage this neurologically intact.  No longer has any the symptoms that he had preoperatively.  Does have EMG and nerve conduction studies consistent with carpal tunnel on the right and he like to proceed with release at some point in the next month  Specialty Comments:  No specialty comments available.  Imaging: No results found.   PMFS  History: Patient Active Problem List   Diagnosis Date Noted  . Carpal tunnel syndrome on both sides 07/28/2018  . Lumbar herniated disc 10/01/2015  . Herniation of lumbar intervertebral disc with radiculopathy 09/30/2015    Class: Acute   Past Medical History:  Diagnosis Date  . Arthritis    knees and back  . Asthma    childhood  . Carpal tunnel syndrome   . Cataract   . Diverticulitis   . GERD (gastroesophageal reflux disease)   . Hypercholesterolemia   . Memory loss     Family History  Problem Relation Age of Onset  . Parkinson's disease Mother   . Heart failure Father   . Diabetes Brother   . Cancer Brother        throat  . Hydrocephalus Brother   . Diabetes Brother   . Migraines Sister     Past Surgical History:  Procedure Laterality Date  . CATARACT EXTRACTION, BILATERAL Bilateral   . COLONOSCOPY WITH PROPOFOL N/A 10/29/2014   Procedure: COLONOSCOPY WITH PROPOFOL;  Surgeon: Garlan Fair, MD;  Location[facility];  Service: Endoscopy;  Laterality: N/A;  . EYE SURGERY    . LUMBAR LAMINECTOMY/DECOMPRESSION MICRODISCECTOMY N/A 10/01/2015   Procedure: LEFT L2-3 MICRODISCECTOMY;  Surgeon: Daleen Bo  Louanne Skye, MD;  Location: [facility];  Service: Orthopedics;  Laterality: N/A;   Social History   Occupational History  . Not on file  Tobacco Use  . Smoking status: Former Smoker    Years: 42.00    Types: Cigars    Last attempt to quit: 10/22/2010    Years since quitting: 7.9  . Smokeless tobacco: Never Used  Substance and Sexual Activity  . Alcohol use: No    Alcohol/week: 0.0 standard drinks  . Drug use: No  . Sexual activity: Never    Birth control/protection: Abstinence

## 2018-10-10 ENCOUNTER — Inpatient Hospital Stay (INDEPENDENT_AMBULATORY_CARE_PROVIDER_SITE_OTHER): Payer: Medicare Other | Admitting: Orthopaedic Surgery

## 2018-10-21 DIAGNOSIS — Z87891 Personal history of nicotine dependence: Secondary | ICD-10-CM | POA: Diagnosis not present

## 2018-10-21 DIAGNOSIS — H6243 Otitis externa in other diseases classified elsewhere, bilateral: Secondary | ICD-10-CM | POA: Diagnosis not present

## 2018-10-21 DIAGNOSIS — B369 Superficial mycosis, unspecified: Secondary | ICD-10-CM | POA: Diagnosis not present

## 2018-12-15 ENCOUNTER — Other Ambulatory Visit: Payer: Self-pay | Admitting: Orthopedic Surgery

## 2018-12-15 DIAGNOSIS — G5601 Carpal tunnel syndrome, right upper limb: Secondary | ICD-10-CM | POA: Diagnosis not present

## 2018-12-15 MED ORDER — HYDROCODONE-ACETAMINOPHEN 5-325 MG PO TABS: 1.0000 | ORAL_TABLET | ORAL | 0 refills | Status: AC | PRN

## 2018-12-20 ENCOUNTER — Inpatient Hospital Stay: Payer: Medicare Other | Admitting: Orthopaedic Surgery

## 2018-12-20 ENCOUNTER — Other Ambulatory Visit: Payer: Self-pay

## 2018-12-20 ENCOUNTER — Encounter: Payer: Self-pay | Admitting: Orthopaedic Surgery

## 2018-12-20 ENCOUNTER — Ambulatory Visit (INDEPENDENT_AMBULATORY_CARE_PROVIDER_SITE_OTHER): Payer: Medicare Other | Admitting: Orthopaedic Surgery

## 2018-12-20 VITALS — BP 142/85 | HR 76 | Ht 70.0 in | Wt 220.0 lb

## 2018-12-20 DIAGNOSIS — G5601 Carpal tunnel syndrome, right upper limb: Secondary | ICD-10-CM

## 2018-12-20 NOTE — Progress Notes (Signed)
Office Visit Note   Patient: Kenneth Harper           Date of Birth: 04-Jul-1941           MRN: [id number] Visit Date: 12/20/2018              Requested by: Wenda Low, MD [facility] [place] [address], [place] [address] PCP: Wenda Low, MD   Assessment & Plan: Visit Diagnoses:  1. Carpal tunnel syndrome of right wrist     Plan:  #1: Dressing removed and Band-Aids placed to the incision. #2: Wrist immobilizer was placed  Follow-Up Instructions: Return in about 1 week (around 12/27/2018).   Orders:  No orders of the defined types were placed in this encounter.  No orders of the defined types were placed in this encounter.     Procedures: No procedures performed   Clinical Data: No additional findings.   Subjective: Chief Complaint  Patient presents with  . Right Wrist - Routine Post Op    Right CTR DOS 12/15/18   HPI: Patient presents today for a postop visit on his right wrist. He is now 5 days out from right carpal tunnel release. Patient states that he is doing well, no pain or complaints.    Review of Systems  Constitutional: Negative.   HENT: Negative.   Eyes: Negative.   Respiratory: Negative.   Cardiovascular: Negative.   Gastrointestinal: Negative.   Genitourinary: Negative.   Musculoskeletal: Negative.   Skin: Negative.   Neurological: Negative.   Hematological: Negative.   Psychiatric/Behavioral: Negative.      Objective: Vital Signs: BP (!) 142/85   Pulse 76   Ht 5\' 10"  (1.778 m)   Wt 220 lb (99.8 kg)   BMI 31.57 kg/m   Physical Exam Constitutional:      Appearance: Normal appearance. He is well-developed.  HENT:     Head: Normocephalic.  Eyes:     Pupils: Pupils are equal, round, and reactive to light.  Pulmonary:     Effort: Pulmonary effort is normal.  Skin:    General: Skin is warm and dry.  Neurological:     Mental Status: He is alert and oriented to person, place, and time.  Psychiatric:        Behavior:  Behavior normal.     Ortho Exam  Dressings removed and and bandage placed to the wound.  Wrist immobilizer was placed.    Capillary refill.  Sensation is intact.  Specialty Comments:  No specialty comments available.  Imaging: No results found.   PMFS History: Current Outpatient Medications  Medication Sig Dispense Refill  . acetaminophen (TYLENOL) 500 MG tablet Take 1,000 mg by mouth daily as needed for mild pain.    Marland Kitchen acetic acid-hydrocortisone (VOSOL-HC) OTIC solution     . aspirin 81 MG EC tablet Take 81 mg by mouth every Monday, Wednesday, and Friday at 6 PM. Swallow whole.    Marland Kitchen atorvastatin (LIPITOR) 40 MG tablet Take 40 mg by mouth daily.   1  . Calcium Carbonate-Vitamin D (CALTRATE 600+D PO) Take 1 tablet by mouth daily.    Marland Kitchen HYDROcodone-acetaminophen (NORCO/VICODIN) 5-325 MG tablet Take 1 tablet by mouth every 4 (four) hours as needed for moderate pain. 20 tablet 0  . Multiple Vitamins-Minerals (MULTIVITAMIN WITH MINERALS) tablet Take 1 tablet by mouth daily.    . naproxen sodium (ALEVE) 220 MG tablet Take 220 mg by mouth.    . Omega-3 Fatty Acids (FISH OIL PO)  Take 1 tablet by mouth daily.    Marland Kitchen OVER THE COUNTER MEDICATION Apply 1 application topically daily as needed. Hemp Cream    . oxyCODONE (OXY IR/ROXICODONE) 5 MG immediate release tablet Take 1-2 tablets (5-10 mg total) by mouth every 4 (four) hours as needed for severe pain. 30 tablet 0   No current facility-administered medications for this visit.     Patient Active Problem List   Diagnosis Date Noted  . Carpal tunnel syndrome on both sides 07/28/2018  . Lumbar herniated disc 10/01/2015  . Herniation of lumbar intervertebral disc with radiculopathy 09/30/2015    Class: Acute   Past Medical History:  Diagnosis Date  . Arthritis    knees and back  . Asthma    childhood  . Carpal tunnel syndrome   . Cataract   . Diverticulitis   . GERD (gastroesophageal reflux disease)   . Hypercholesterolemia   .  Memory loss     Family History  Problem Relation Age of Onset  . Parkinson's disease Mother   . Heart failure Father   . Diabetes Brother   . Cancer Brother        throat  . Hydrocephalus Brother   . Diabetes Brother   . Migraines Sister     Past Surgical History:  Procedure Laterality Date  . CATARACT EXTRACTION, BILATERAL Bilateral   . COLONOSCOPY WITH PROPOFOL N/A 10/29/2014   Procedure: COLONOSCOPY WITH PROPOFOL;  Surgeon: Garlan Fair, MD;  Location: [facility];  Service: Endoscopy;  Laterality: N/A;  . EYE SURGERY    . LUMBAR LAMINECTOMY/DECOMPRESSION MICRODISCECTOMY N/A 10/01/2015   Procedure: LEFT L2-3 MICRODISCECTOMY;  Surgeon: Jessy Oto, MD;  Location: [facility];  Service: Orthopedics;  Laterality: N/A;   Social History   Occupational History  . Not on file  Tobacco Use  . Smoking status: Former Smoker    Years: 42.00    Types: Cigars    Last attempt to quit: 10/22/2010    Years since quitting: 8.1  . Smokeless tobacco: Never Used  Substance and Sexual Activity  . Alcohol use: No    Alcohol/week: 0.0 standard drinks  . Drug use: No  . Sexual activity: Never    Birth control/protection: Abstinence

## 2018-12-29 ENCOUNTER — Other Ambulatory Visit: Payer: Self-pay

## 2018-12-29 ENCOUNTER — Encounter: Payer: Self-pay | Admitting: Orthopaedic Surgery

## 2018-12-29 ENCOUNTER — Ambulatory Visit (INDEPENDENT_AMBULATORY_CARE_PROVIDER_SITE_OTHER): Payer: Medicare Other | Admitting: Orthopaedic Surgery

## 2018-12-29 VITALS — BP 151/84 | HR 79 | Ht 70.0 in | Wt 220.0 lb

## 2018-12-29 DIAGNOSIS — G5601 Carpal tunnel syndrome, right upper limb: Secondary | ICD-10-CM

## 2018-12-29 NOTE — Progress Notes (Signed)
Office Visit Note   Patient: Kenneth Harper           Date of Birth: [birth year]           MRN: [id number] Visit Date: 12/29/2018              Requested by: Wenda Low, MD [facility] [place] [address],  [place] [address] PCP: Wenda Low, MD   Assessment & Plan: Visit Diagnoses:  1. Carpal tunnel syndrome of right wrist     Plan:  #1: Sutures removed and Steri-Strips are placed on the right palm #2: He is got good sensation in the median ulnar and radial nerve. #3: No signs of infection.  There was some maceration at the most proximal portion of the wound. #4: When he returns he would like to have his knee examined also.  Follow-Up Instructions: Return in about 3 weeks (around 01/19/2019).   Orders:  No orders of the defined types were placed in this encounter.  No orders of the defined types were placed in this encounter.     Procedures: No procedures performed   Clinical Data: No additional findings.   Subjective: Chief Complaint  Patient presents with  . Right Wrist - Routine Post Op    Right carpal tunnel release on 12/15/2018   HPI Patient presents today now two weeks out from right carpal tunnel release surgery. The surgery was on 12/15/2018. Patient states that he is doing well. No complaints. He states that he does not wear his wrist brace much. He does not take anything for pain except Tylenol or Aleve if needed. No numbness or tingling in either hand.    Review of Systems  Constitutional: Negative.   HENT: Negative.   Eyes: Negative.   Respiratory: Negative.   Cardiovascular: Negative.   Gastrointestinal: Negative.   Genitourinary: Negative.   Musculoskeletal: Negative.   Skin: Negative.   Neurological: Negative.   Hematological: Negative.   Psychiatric/Behavioral: Negative.      Objective: Vital Signs: BP (!) 151/84   Pulse 79   Ht 5\' 10"  (1.778 m)   Wt 220 lb (99.8 kg)   BMI 31.57 kg/m   Physical Exam Constitutional:    Appearance: He is well-developed.  Eyes:     Pupils: Pupils are equal, round, and reactive to light.  Pulmonary:     Effort: Pulmonary effort is normal.  Skin:    General: Skin is warm and dry.  Neurological:     Mental Status: He is alert and oriented to person, place, and time.  Psychiatric:        Behavior: Behavior normal.     Ortho Exam  Wounds healing per primam.  No signs of infection.  There is some maceration over the proximal portion of the wound.  Good sensation.  Good strength.  Specialty Comments:  No specialty comments available.  Imaging: No results found.   PMFS History: Current Outpatient Medications  Medication Sig Dispense Refill  . acetaminophen (TYLENOL) 500 MG tablet Take 1,000 mg by mouth daily as needed for mild pain.    Marland Kitchen acetic acid-hydrocortisone (VOSOL-HC) OTIC solution     . aspirin 81 MG EC tablet Take 81 mg by mouth every Monday, Wednesday, and Friday at 6 PM. Swallow whole.    Marland Kitchen atorvastatin (LIPITOR) 40 MG tablet Take 40 mg by mouth daily.   1  . Calcium Carbonate-Vitamin D (CALTRATE 600+D PO) Take 1 tablet by mouth daily.    Marland Kitchen HYDROcodone-acetaminophen (  NORCO/VICODIN) 5-325 MG tablet Take 1 tablet by mouth every 4 (four) hours as needed for moderate pain. 20 tablet 0  . Multiple Vitamins-Minerals (MULTIVITAMIN WITH MINERALS) tablet Take 1 tablet by mouth daily.    . naproxen sodium (ALEVE) 220 MG tablet Take 220 mg by mouth.    . Omega-3 Fatty Acids (FISH OIL PO) Take 1 tablet by mouth daily.    Marland Kitchen OVER THE COUNTER MEDICATION Apply 1 application topically daily as needed. Hemp Cream    . oxyCODONE (OXY IR/ROXICODONE) 5 MG immediate release tablet Take 1-2 tablets (5-10 mg total) by mouth every 4 (four) hours as needed for severe pain. 30 tablet 0   No current facility-administered medications for this visit.     Patient Active Problem List   Diagnosis Date Noted  . Carpal tunnel syndrome on both sides 07/28/2018  . Lumbar herniated disc  10/01/2015  . Herniation of lumbar intervertebral disc with radiculopathy 09/30/2015    Class: Acute   Past Medical History:  Diagnosis Date  . Arthritis    knees and back  . Asthma    childhood  . Carpal tunnel syndrome   . Cataract   . Diverticulitis   . GERD (gastroesophageal reflux disease)   . Hypercholesterolemia   . Memory loss     Family History  Problem Relation Age of Onset  . Parkinson's disease Mother   . Heart failure Father   . Diabetes Brother   . Cancer Brother        throat  . Hydrocephalus Brother   . Diabetes Brother   . Migraines Sister     Past Surgical History:  Procedure Laterality Date  . CATARACT EXTRACTION, BILATERAL Bilateral   . COLONOSCOPY WITH PROPOFOL N/A 10/29/2014   Procedure: COLONOSCOPY WITH PROPOFOL;  Surgeon: Garlan Fair, MD;  Location: [facility];  Service: Endoscopy;  Laterality: N/A;  . EYE SURGERY    . LUMBAR LAMINECTOMY/DECOMPRESSION MICRODISCECTOMY N/A 10/01/2015   Procedure: LEFT L2-3 MICRODISCECTOMY;  Surgeon: Jessy Oto, MD;  Location: [facility];  Service: Orthopedics;  Laterality: N/A;   Social History   Occupational History  . Not on file  Tobacco Use  . Smoking status: Former Smoker    Years: 42.00    Types: Cigars    Quit date: 10/22/2010    Years since quitting: 8.1  . Smokeless tobacco: Never Used  Substance and Sexual Activity  . Alcohol use: No    Alcohol/week: 0.0 standard drinks  . Drug use: No  . Sexual activity: Never    Birth control/protection: Abstinence

## 2019-01-19 ENCOUNTER — Ambulatory Visit (INDEPENDENT_AMBULATORY_CARE_PROVIDER_SITE_OTHER): Payer: Medicare Other | Admitting: Orthopaedic Surgery

## 2019-01-19 ENCOUNTER — Other Ambulatory Visit: Payer: Self-pay

## 2019-01-19 ENCOUNTER — Encounter: Payer: Self-pay | Admitting: Orthopaedic Surgery

## 2019-01-19 VITALS — BP 134/75 | HR 85 | Resp 18 | Ht 70.0 in | Wt 225.0 lb

## 2019-01-19 DIAGNOSIS — G5603 Carpal tunnel syndrome, bilateral upper limbs: Secondary | ICD-10-CM

## 2019-01-19 NOTE — Progress Notes (Signed)
Office Visit Note   Patient: Kenneth Harper           Date of Birth: January 11, 1941           MRN: [id number] Visit Date: 01/19/2019              Requested by: Wenda Low, MD [facility] [place] [address],  [place] [address] PCP: Wenda Low, MD   Assessment & Plan: Visit Diagnoses:  1. Carpal tunnel syndrome on both sides     Plan: 5 weeks status post right carpal tunnel release and doing very well without problems.  Also was multiple months status post left carpal tunnel release and doing well.  We will plan to see him back as needed.  Also had a long discussion regarding both of his knees.  He had films in December demonstrating end-stage osteoarthritis taking Aleve.  Does have occasional sensation of his knee giving way.  I have encouraged him to exercise consider a core occasional cortisone injection.  Also discussed knee replacement   Follow-Up Instructions: Return if symptoms worsen or fail to improve.   Orders:  No orders of the defined types were placed in this encounter.  No orders of the defined types were placed in this encounter.     Procedures: No procedures performed   Clinical Data: No additional findings.   Subjective: Chief Complaint  Patient presents with  . Left Knee - Pain  . Right Knee - Pain  . Right Wrist - Routine Post Op   Kenneth Harper is a 78 year old male who presents with bilateral knee pain. He played softball x 50 years ago with injury. He has popping, difficulty walking and tenderness. He has some left knee swelling at times. He takes Aleve which helps.  He is also post op for right carpel tunnel surgery x 5 weeks. His wrist is doing well.   HPI  Review of Systems  Constitutional: Negative for fatigue.  HENT: Negative for trouble swallowing.   Eyes: Negative for pain.  Respiratory: Negative for shortness of breath.   Cardiovascular: Negative for leg swelling.  Gastrointestinal: Negative for constipation.  Endocrine:  Negative for cold intolerance.  Genitourinary: Negative for difficulty urinating.  Musculoskeletal: Positive for gait problem and joint swelling.  Skin: Negative for rash.  Allergic/Immunologic: Negative for food allergies.  Neurological: Negative for numbness.  Hematological: Does not bruise/bleed easily.  Psychiatric/Behavioral: Negative for sleep disturbance.     Objective: Vital Signs: BP 134/75 (BP Location: Right Arm, Patient Position: Sitting, Cuff Size: Normal)   Pulse 85   Resp 18   Ht 5\' 10"  (1.778 m)   Wt 225 lb (102.1 kg)   BMI 32.28 kg/m   Physical Exam Constitutional:      Appearance: He is well-developed.  Eyes:     Pupils: Pupils are equal, round, and reactive to light.  Pulmonary:     Effort: Pulmonary effort is normal.  Skin:    General: Skin is warm and dry.  Neurological:     Mental Status: He is alert and oriented to person, place, and time.  Psychiatric:        Behavior: Behavior normal.     Ortho Exam right carpal tunnel incision healing without problem.  Some induration but no pain.  Full range of motion of his fingers.  Neurologically intact.  No pain.  Negative Tinel's over the median nerve.  Has end-stage osteoarthritis of both knees but tickly along the medial compartment.  Does have increased varus.  No effusions today some patellar crepitation.  No clinical instability Specialty Comments:  No specialty comments available.  Imaging: No results found.   PMFS History: Patient Active Problem List   Diagnosis Date Noted  . Carpal tunnel syndrome on both sides 07/28/2018  . Lumbar herniated disc 10/01/2015  . Herniation of lumbar intervertebral disc with radiculopathy 09/30/2015    Class: Acute   Past Medical History:  Diagnosis Date  . Arthritis    knees and back  . Asthma    childhood  . Carpal tunnel syndrome   . Cataract   . Diverticulitis   . GERD (gastroesophageal reflux disease)   . Hypercholesterolemia   . Memory loss      Family History  Problem Relation Age of Onset  . Parkinson's disease Mother   . Heart failure Father   . Diabetes Brother   . Cancer Brother        throat  . Hydrocephalus Brother   . Diabetes Brother   . Migraines Sister     Past Surgical History:  Procedure Laterality Date  . CATARACT EXTRACTION, BILATERAL Bilateral   . COLONOSCOPY WITH PROPOFOL N/A 10/29/2014   Procedure: COLONOSCOPY WITH PROPOFOL;  Surgeon: Garlan Fair, MD;  Location: [facility];  Service: Endoscopy;  Laterality: N/A;  . EYE SURGERY    . LUMBAR LAMINECTOMY/DECOMPRESSION MICRODISCECTOMY N/A 10/01/2015   Procedure: LEFT L2-3 MICRODISCECTOMY;  Surgeon: Jessy Oto, MD;  Location: [facility];  Service: Orthopedics;  Laterality: N/A;   Social History   Occupational History  . Not on file  Tobacco Use  . Smoking status: Former Smoker    Years: 42.00    Types: Cigars    Quit date: 10/22/2010    Years since quitting: 8.2  . Smokeless tobacco: Never Used  Substance and Sexual Activity  . Alcohol use: No    Alcohol/week: 0.0 standard drinks  . Drug use: No  . Sexual activity: Never    Birth control/protection: Abstinence

## 2019-02-07 DIAGNOSIS — D649 Anemia, unspecified: Secondary | ICD-10-CM | POA: Diagnosis not present

## 2019-02-07 DIAGNOSIS — E7800 Pure hypercholesterolemia, unspecified: Secondary | ICD-10-CM | POA: Diagnosis not present

## 2019-02-07 DIAGNOSIS — Z1389 Encounter for screening for other disorder: Secondary | ICD-10-CM | POA: Diagnosis not present

## 2019-02-07 DIAGNOSIS — R7303 Prediabetes: Secondary | ICD-10-CM | POA: Diagnosis not present

## 2019-02-07 DIAGNOSIS — Z0000 Encounter for general adult medical examination without abnormal findings: Secondary | ICD-10-CM | POA: Diagnosis not present

## 2019-02-07 DIAGNOSIS — N400 Enlarged prostate without lower urinary tract symptoms: Secondary | ICD-10-CM | POA: Diagnosis not present

## 2019-02-07 DIAGNOSIS — M1990 Unspecified osteoarthritis, unspecified site: Secondary | ICD-10-CM | POA: Diagnosis not present

## 2019-02-07 DIAGNOSIS — Z23 Encounter for immunization: Secondary | ICD-10-CM | POA: Diagnosis not present

## 2019-04-19 DIAGNOSIS — Z23 Encounter for immunization: Secondary | ICD-10-CM | POA: Diagnosis not present

## 2019-04-27 DIAGNOSIS — L988 Other specified disorders of the skin and subcutaneous tissue: Secondary | ICD-10-CM | POA: Diagnosis not present

## 2019-04-27 DIAGNOSIS — H6242 Otitis externa in other diseases classified elsewhere, left ear: Secondary | ICD-10-CM | POA: Diagnosis not present

## 2019-04-27 DIAGNOSIS — B369 Superficial mycosis, unspecified: Secondary | ICD-10-CM | POA: Diagnosis not present

## 2019-04-27 DIAGNOSIS — Z87891 Personal history of nicotine dependence: Secondary | ICD-10-CM | POA: Diagnosis not present

## 2019-05-11 DIAGNOSIS — C44319 Basal cell carcinoma of skin of other parts of face: Secondary | ICD-10-CM | POA: Diagnosis not present

## 2019-05-11 DIAGNOSIS — H6240 Otitis externa in other diseases classified elsewhere, unspecified ear: Secondary | ICD-10-CM | POA: Diagnosis not present

## 2019-05-11 DIAGNOSIS — B369 Superficial mycosis, unspecified: Secondary | ICD-10-CM | POA: Diagnosis not present

## 2019-05-31 DIAGNOSIS — B369 Superficial mycosis, unspecified: Secondary | ICD-10-CM | POA: Diagnosis not present

## 2019-05-31 DIAGNOSIS — H938X1 Other specified disorders of right ear: Secondary | ICD-10-CM | POA: Diagnosis not present

## 2019-05-31 DIAGNOSIS — H6242 Otitis externa in other diseases classified elsewhere, left ear: Secondary | ICD-10-CM | POA: Diagnosis not present

## 2019-05-31 DIAGNOSIS — Z87891 Personal history of nicotine dependence: Secondary | ICD-10-CM | POA: Diagnosis not present

## 2019-06-15 DIAGNOSIS — B369 Superficial mycosis, unspecified: Secondary | ICD-10-CM | POA: Diagnosis not present

## 2019-06-15 DIAGNOSIS — H6240 Otitis externa in other diseases classified elsewhere, unspecified ear: Secondary | ICD-10-CM | POA: Diagnosis not present

## 2019-08-08 ENCOUNTER — Ambulatory Visit: Payer: Medicare Other | Admitting: Orthopaedic Surgery

## 2019-08-10 ENCOUNTER — Encounter: Payer: Self-pay | Admitting: Orthopaedic Surgery

## 2019-08-10 ENCOUNTER — Other Ambulatory Visit: Payer: Self-pay

## 2019-08-10 ENCOUNTER — Ambulatory Visit: Payer: Medicare HMO | Admitting: Orthopaedic Surgery

## 2019-08-10 DIAGNOSIS — M170 Bilateral primary osteoarthritis of knee: Secondary | ICD-10-CM

## 2019-08-10 MED ORDER — LIDOCAINE HCL 1 % IJ SOLN
2.0000 mL | INTRAMUSCULAR | Status: AC | PRN
  Administered 2019-08-10: 2 mL

## 2019-08-10 MED ORDER — BUPIVACAINE HCL 0.5 % IJ SOLN
2.0000 mL | INTRAMUSCULAR | Status: AC | PRN
  Administered 2019-08-10: 2 mL via INTRA_ARTICULAR

## 2019-08-10 MED ORDER — METHYLPREDNISOLONE ACETATE 40 MG/ML IJ SUSP
80.0000 mg | INTRAMUSCULAR | Status: AC | PRN
  Administered 2019-08-10: 80 mg via INTRA_ARTICULAR

## 2019-08-10 MED ORDER — METHYLPREDNISOLONE ACETATE 40 MG/ML IJ SUSP
80.0000 mg | INTRAMUSCULAR | Status: AC | PRN
  Administered 2019-08-10: 11:00:00 80 mg via INTRA_ARTICULAR

## 2019-08-10 NOTE — Progress Notes (Signed)
Office Visit Note   Patient: Kenneth Harper           Date of Birth: 10-07-1940           MRN: [id number] Visit Date: 08/10/2019              Requested by: Wenda Low, MD [place] [address],  [place] [address] PCP: Wenda Low, MD   Assessment & Plan: Visit Diagnoses:  1. Bilateral primary osteoarthritis of knee     Plan: Bilateral end-stage osteoarthritis of the knees.  Long discussion regarding diagnosis and treatment options.  He preferred to have cortisone injections.  Have discussed viscosupplementation as well as knee replacement.  Follow-Up Instructions: Return if symptoms worsen or fail to improve.   Orders:  Orders Placed This Encounter  Procedures  . Large Joint Inj: bilateral knee   No orders of the defined types were placed in this encounter.     Procedures: Large Joint Inj: bilateral knee on 08/10/2019 10:30 AM Indications: diagnostic evaluation Details: 25 G 1.5 in needle, anteromedial approach  Arthrogram: No  Medications (Right): 2 mL lidocaine 1 %; 2 mL bupivacaine 0.5 %; 80 mg methylPREDNISolone acetate 40 MG/ML Medications (Left): 2 mL lidocaine 1 %; 2 mL bupivacaine 0.5 %; 80 mg methylPREDNISolone acetate 40 MG/ML Outcome: tolerated well, no immediate complications Consent was given by the patient. Immediately prior to procedure a time out was called to verify the correct patient, procedure, equipment, support staff and site/side marked as required. Patient was prepped and draped in the usual sterile fashion.       Clinical Data: No additional findings.   Subjective: Chief Complaint  Patient presents with  . Right Knee - Pain  . Left Knee - Pain  Patient presents today for bilateral knee pain. He said that his left hurts more than the right. He wears knee sleeves bilaterally and states that they help. His pain is located anteriorly in both knees. No swelling, giving way, or catching. He has been taking Aleve for pain.  Going down steps is very difficult for him and he has to go down sideways. He was last evaluated in December of 2019 and states that he declined cortisone injections at the time, but now would be willing to try them if Dr.Cyril Railey wanted to do that.  Films at that time demonstrated end-stage changes particularly in the medial compartment where there was collapse of the medial joint  HPI  Review of Systems   Objective: Vital Signs: Ht 5\' 10"  (1.778 m)   Wt 230 lb (104.3 kg)   BMI 33.00 kg/m   Physical Exam Constitutional:      Appearance: He is well-developed.  Eyes:     Pupils: Pupils are equal, round, and reactive to light.  Pulmonary:     Effort: Pulmonary effort is normal.  Skin:    General: Skin is warm and dry.  Neurological:     Mental Status: He is alert and oriented to person, place, and time.  Psychiatric:        Behavior: Behavior normal.     Ortho Exam awake alert and oriented x3.  Comfortable sitting increased varus of both knees with small effusions.  Predominately medial joint pain.  Some patellar crepitation.  No pain laterally.  No instability.  No popliteal pain.  No calf discomfort.  Mild nonpitting edema both ankles.  Motor intact.  Painless range of motion both hips straight leg raise negative  Specialty Comments:  No specialty comments available.  Imaging: No results found.   PMFS History: Patient Active Problem List   Diagnosis Date Noted  . Bilateral primary osteoarthritis of knee 08/10/2019  . Carpal tunnel syndrome on both sides 07/28/2018  . Lumbar herniated disc 10/01/2015  . Herniation of lumbar intervertebral disc with radiculopathy 09/30/2015    Class: Acute   Past Medical History:  Diagnosis Date  . Arthritis    knees and back  . Asthma    childhood  . Carpal tunnel syndrome   . Cataract   . Diverticulitis   . GERD (gastroesophageal reflux disease)   . Hypercholesterolemia   . Memory loss     Family History  Problem Relation  Age of Onset  . Parkinson's disease Mother   . Heart failure Father   . Diabetes Brother   . Cancer Brother        throat  . Hydrocephalus Brother   . Diabetes Brother   . Migraines Sister     Past Surgical History:  Procedure Laterality Date  . CATARACT EXTRACTION, BILATERAL Bilateral   . COLONOSCOPY WITH PROPOFOL N/A 10/29/2014   Procedure: COLONOSCOPY WITH PROPOFOL;  Surgeon: Garlan Fair, MD;  Location: [facility];  Service: Endoscopy;  Laterality: N/A;  . EYE SURGERY    . LUMBAR LAMINECTOMY/DECOMPRESSION MICRODISCECTOMY N/A 10/01/2015   Procedure: LEFT L2-3 MICRODISCECTOMY;  Surgeon: Jessy Oto, MD;  Location: [facility];  Service: Orthopedics;  Laterality: N/A;   Social History   Occupational History  . Not on file  Tobacco Use  . Smoking status: Former Smoker    Years: 42.00    Types: Cigars    Quit date: 10/22/2010    Years since quitting: 8.8  . Smokeless tobacco: Never Used  Substance and Sexual Activity  . Alcohol use: No    Alcohol/week: 0.0 standard drinks  . Drug use: No  . Sexual activity: Never    Birth control/protection: Abstinence

## 2019-10-28 DIAGNOSIS — M19071 Primary osteoarthritis, right ankle and foot: Secondary | ICD-10-CM | POA: Diagnosis not present

## 2019-11-07 ENCOUNTER — Other Ambulatory Visit: Payer: Self-pay

## 2019-11-07 ENCOUNTER — Encounter: Payer: Self-pay | Admitting: Orthopaedic Surgery

## 2019-11-07 ENCOUNTER — Ambulatory Visit: Payer: Medicare HMO | Admitting: Orthopaedic Surgery

## 2019-11-07 VITALS — Ht 70.0 in | Wt 230.0 lb

## 2019-11-07 DIAGNOSIS — M170 Bilateral primary osteoarthritis of knee: Secondary | ICD-10-CM | POA: Diagnosis not present

## 2019-11-07 NOTE — Progress Notes (Signed)
Office Visit Note   Patient: Kenneth Harper           Date of Birth: [birth year]           MRN: [id number] Visit Date: 11/07/2019              Requested by: Wenda Low, MD [address] [place],  [place] [address] PCP: Wenda Low, MD   Assessment & Plan: Visit Diagnoses:  1. Bilateral primary osteoarthritis of knee     Plan:  #1: At this time he is essentially asymptomatic in regards to his pain in his knees.  He did have a gouty flare in his right ankle and was placed on prednisone by the urgent care and had marked resolution with. #2: Had a long discussion with him in regards to injecting the knees when they are not painful which is not appropriate.  We have discussed that of Voltaren gel that he could use even once a day if necessary to keep his knees under control. #3: When he does become symptomatic he can give [place] a call and we will see him hopefully on that day and inject his knees as needed.  Follow-Up Instructions: Return if symptoms worsen or fail to improve.   Orders:  No orders of the defined types were placed in this encounter.  No orders of the defined types were placed in this encounter.     Procedures: No procedures performed   Clinical Data: No additional findings.   Subjective: Chief Complaint  Patient presents with  . Right Knee - Pain  . Left Knee - Pain  Patient presents today for recurrent bilateral knee pain. He was last seen on 08/10/2019 and received cortisone injections bilaterally. He said that the injections helped. He said that they are still helping, but wants to get more injections today. He is not diabetic. He wears copper fit braces on both of his knees. He takes Aleve as needed for pain.  He also mentioned that over a week ago he felt something pop in his right foot. He had pain, tenderness, and swelling in his midfoot. He went to [facility] and had x-rays taken. He was told that they thought he had a gout flare. He  was given prednisone and states that he no longer has any problems with his foot.   HPI  Review of Systems  Constitutional: Negative for fatigue.  HENT: Negative for ear pain.   Eyes: Negative for pain.  Respiratory: Negative for shortness of breath.   Cardiovascular: Negative for leg swelling.  Gastrointestinal: Negative for constipation and diarrhea.  Endocrine: Negative for cold intolerance and heat intolerance.  Genitourinary: Negative for difficulty urinating.  Musculoskeletal: Negative for joint swelling.  Skin: Negative for rash.  Allergic/Immunologic: Negative for food allergies.  Neurological: Negative for weakness.  Hematological: Does not bruise/bleed easily.  Psychiatric/Behavioral: Negative for sleep disturbance.     Objective: Vital Signs: Ht 5\' 10"  (1.778 m)   Wt 230 lb (104.3 kg)   BMI 33.00 kg/m   Physical Exam Constitutional:      Appearance: Normal appearance. He is well-developed. He is obese.  HENT:     Head: Normocephalic.  Eyes:     Pupils: Pupils are equal, round, and reactive to light.  Pulmonary:     Effort: Pulmonary effort is normal.  Skin:    General: Skin is warm and dry.  Neurological:     Mental Status: He is alert and oriented to person,  place, and time.  Psychiatric:        Behavior: Behavior normal.     Ortho Exam  Today his knees are quite benign.  Maybe a little bit of an effusion in both knees.  No warmth or erythema.  He has near full extension and flexes to about 95-100degrees.  Patellofemoral crepitance with range of motion.  His right foot today is also cool no warmth or erythema.  No real pain with palpation or with motion of the foot and ankle.  Specialty Comments:  No specialty comments available.  Imaging: No results found.   PMFS History: Current Outpatient Medications  Medication Sig Dispense Refill  . acetaminophen (TYLENOL) 500 MG tablet Take 1,000 mg by mouth daily as needed for mild pain.    Marland Kitchen acetic  acid-hydrocortisone (VOSOL-HC) OTIC solution     . aspirin 81 MG EC tablet Take 81 mg by mouth every Monday, Wednesday, and Friday at 6 PM. Swallow whole.    Marland Kitchen atorvastatin (LIPITOR) 40 MG tablet Take 40 mg by mouth daily.   1  . Calcium Carbonate-Vitamin D (CALTRATE 600+D PO) Take 1 tablet by mouth daily.    . Cetirizine HCl (ZYRTEC PO) Take by mouth as needed.    . Multiple Vitamins-Minerals (MULTIVITAMIN WITH MINERALS) tablet Take 1 tablet by mouth daily.    . naproxen sodium (ALEVE) 220 MG tablet Take 220 mg by mouth.    . Omega-3 Fatty Acids (FISH OIL PO) Take 1 tablet by mouth daily.    Marland Kitchen OVER THE COUNTER MEDICATION Apply 1 application topically daily as needed. Hemp Cream    . HYDROcodone-acetaminophen (NORCO/VICODIN) 5-325 MG tablet Take 1 tablet by mouth every 4 (four) hours as needed for moderate pain. (Patient not taking: Reported on 11/07/2019) 20 tablet 0  . oxyCODONE (OXY IR/ROXICODONE) 5 MG immediate release tablet Take 1-2 tablets (5-10 mg total) by mouth every 4 (four) hours as needed for severe pain. (Patient not taking: Reported on 11/07/2019) 30 tablet 0   No current facility-administered medications for this visit.    Patient Active Problem List   Diagnosis Date Noted  . Bilateral primary osteoarthritis of knee 08/10/2019  . Carpal tunnel syndrome on both sides 07/28/2018  . Lumbar herniated disc 10/01/2015  . Herniation of lumbar intervertebral disc with radiculopathy 09/30/2015    Class: Acute   Past Medical History:  Diagnosis Date  . Arthritis    knees and back  . Asthma    childhood  . Carpal tunnel syndrome   . Cataract   . Diverticulitis   . GERD (gastroesophageal reflux disease)   . Hypercholesterolemia   . Memory loss     Family History  Problem Relation Age of Onset  . Parkinson's disease Mother   . Heart failure Father   . Diabetes Brother   . Cancer Brother        throat  . Hydrocephalus Brother   . Diabetes Brother   . Migraines Sister       Past Surgical History:  Procedure Laterality Date  . CATARACT EXTRACTION, BILATERAL Bilateral   . COLONOSCOPY WITH PROPOFOL N/A 10/29/2014   Procedure: COLONOSCOPY WITH PROPOFOL;  Surgeon: Garlan Fair, MD;  Location: [facility];  Service: Endoscopy;  Laterality: N/A;  . EYE SURGERY    . LUMBAR LAMINECTOMY/DECOMPRESSION MICRODISCECTOMY N/A 10/01/2015   Procedure: LEFT L2-3 MICRODISCECTOMY;  Surgeon: Jessy Oto, MD;  Location: [facility];  Service: Orthopedics;  Laterality: N/A;   Social History  Occupational History  . Not on file  Tobacco Use  . Smoking status: Former Smoker    Years: 42.00    Types: Cigars    Quit date: 10/22/2010    Years since quitting: 9.0  . Smokeless tobacco: Never Used  Substance and Sexual Activity  . Alcohol use: No    Alcohol/week: 0.0 standard drinks  . Drug use: No  . Sexual activity: Never    Birth control/protection: Abstinence

## 2019-11-21 DIAGNOSIS — M109 Gout, unspecified: Secondary | ICD-10-CM | POA: Diagnosis not present

## 2020-02-12 DIAGNOSIS — Z0000 Encounter for general adult medical examination without abnormal findings: Secondary | ICD-10-CM | POA: Diagnosis not present

## 2020-02-12 DIAGNOSIS — Z1389 Encounter for screening for other disorder: Secondary | ICD-10-CM | POA: Diagnosis not present

## 2020-02-12 DIAGNOSIS — R030 Elevated blood-pressure reading, without diagnosis of hypertension: Secondary | ICD-10-CM | POA: Diagnosis not present

## 2020-02-12 DIAGNOSIS — E669 Obesity, unspecified: Secondary | ICD-10-CM | POA: Diagnosis not present

## 2020-02-12 DIAGNOSIS — R7309 Other abnormal glucose: Secondary | ICD-10-CM | POA: Diagnosis not present

## 2020-02-12 DIAGNOSIS — E7800 Pure hypercholesterolemia, unspecified: Secondary | ICD-10-CM | POA: Diagnosis not present

## 2020-02-12 DIAGNOSIS — N400 Enlarged prostate without lower urinary tract symptoms: Secondary | ICD-10-CM | POA: Diagnosis not present

## 2020-02-12 DIAGNOSIS — M1990 Unspecified osteoarthritis, unspecified site: Secondary | ICD-10-CM | POA: Diagnosis not present

## 2020-02-12 DIAGNOSIS — M109 Gout, unspecified: Secondary | ICD-10-CM | POA: Diagnosis not present

## 2020-03-14 DIAGNOSIS — N182 Chronic kidney disease, stage 2 (mild): Secondary | ICD-10-CM | POA: Diagnosis not present

## 2020-05-07 ENCOUNTER — Ambulatory Visit (INDEPENDENT_AMBULATORY_CARE_PROVIDER_SITE_OTHER): Payer: Medicare HMO | Admitting: Orthopaedic Surgery

## 2020-05-07 ENCOUNTER — Other Ambulatory Visit: Payer: Self-pay

## 2020-05-07 ENCOUNTER — Encounter: Payer: Self-pay | Admitting: Orthopaedic Surgery

## 2020-05-07 VITALS — Ht 70.5 in | Wt 230.0 lb

## 2020-05-07 DIAGNOSIS — M170 Bilateral primary osteoarthritis of knee: Secondary | ICD-10-CM

## 2020-05-07 MED ORDER — LIDOCAINE HCL 1 % IJ SOLN
2.0000 mL | INTRAMUSCULAR | Status: AC | PRN
  Administered 2020-05-07: 2 mL

## 2020-05-07 MED ORDER — BUPIVACAINE HCL 0.25 % IJ SOLN
2.0000 mL | INTRAMUSCULAR | Status: AC | PRN
  Administered 2020-05-07: 2 mL via INTRA_ARTICULAR

## 2020-05-07 NOTE — Progress Notes (Signed)
Office Visit Note   Patient: Kenneth Harper           Date of Birth: 05/17/1941           MRN: [id number] Visit Date: 05/07/2020              Requested by: Wenda Low, MD [place] [address],  [place] [address] PCP: Wenda Low, MD   Assessment & Plan: Visit Diagnoses:  1. Bilateral primary osteoarthritis of knee     Plan: [name] relates that his last cortisone injection to both knees lasted about "9 months".  He like to have another cortisone injection in both knees.  He remains active and is happy with his progress.  We have briefly discussed viscosupplementation if the knee replacement over time  Follow-Up Instructions: Return if symptoms worsen or fail to improve.   Orders:  Orders Placed This Encounter  Procedures  . Large Joint Inj: bilateral knee   No orders of the defined types were placed in this encounter.     Procedures: Large Joint Inj: bilateral knee on 05/07/2020 1:57 PM Indications: diagnostic evaluation Details: 25 G 1.5 in needle, anteromedial approach  Arthrogram: No  Medications (Right): 2 mL lidocaine 1 %; 2 mL bupivacaine 0.25 % Medications (Left): 2 mL lidocaine 1 %; 2 mL bupivacaine 0.25 %  2 cc of betamethasone injected in both knees Consent was given by the patient. Immediately prior to procedure a time out was called to verify the correct patient, procedure, equipment, support staff and site/side marked as required. Patient was prepped and draped in the usual sterile fashion.       Clinical Data: No additional findings.   Subjective: Chief Complaint  Patient presents with  . Left Knee - Follow-up, Pain  . Right Knee - Follow-up, Pain  Patient presents today for bilateral knee pain. He received bilateral cortisone injections in January of this year. Patient states that the injections wore off a month ago. Both knees hurt equally. He is wanting to get more injections today. He takes tylenol for pain.  Prior  films of both knees were obtained in 2019 demonstrating near bone-on-bone in the medial compartment bilaterally  HPI  Review of Systems  Constitutional: Negative for fatigue.  HENT: Negative for ear pain.   Eyes: Negative for pain.  Respiratory: Negative for shortness of breath.   Cardiovascular: Negative for leg swelling.  Gastrointestinal: Negative for constipation and diarrhea.  Endocrine: Negative for cold intolerance and heat intolerance.  Genitourinary: Negative for difficulty urinating.  Musculoskeletal: Negative for joint swelling.  Skin: Negative for rash.  Allergic/Immunologic: Negative for food allergies.  Neurological: Negative for weakness.  Hematological: Bruises/bleeds easily.  Psychiatric/Behavioral: Negative for sleep disturbance.     Objective: Vital Signs: Ht 5' 10.5" (1.791 m)   Wt 230 lb (104.3 kg)   BMI 32.54 kg/m   Physical Exam Constitutional:      Appearance: He is well-developed.  Eyes:     Pupils: Pupils are equal, round, and reactive to light.  Pulmonary:     Effort: Pulmonary effort is normal.  Skin:    General: Skin is warm and dry.  Neurological:     Mental Status: He is alert and oriented to person, place, and time.  Psychiatric:        Behavior: Behavior normal.     Ortho Exam awake alert and oriented x3.  Comfortable sitting.  Both knees with minimal effusion.  The knees were not hot  red or warm.  Full extension about 100 degrees of flexion without instability.  Mostly medial joint pain bilaterally.  Some patella crepitation but no pain with patella compression.  No popliteal pain or mass. Specialty Comments:  No specialty comments available.  Imaging: No results found.   PMFS History: Patient Active Problem List   Diagnosis Date Noted  . Bilateral primary osteoarthritis of knee 08/10/2019  . Carpal tunnel syndrome on both sides 07/28/2018  . Lumbar herniated disc 10/01/2015  . Herniation of lumbar intervertebral disc with  radiculopathy 09/30/2015    Class: Acute   Past Medical History:  Diagnosis Date  . Arthritis    knees and back  . Asthma    childhood  . Carpal tunnel syndrome   . Cataract   . Diverticulitis   . GERD (gastroesophageal reflux disease)   . Hypercholesterolemia   . Memory loss     Family History  Problem Relation Age of Onset  . Parkinson's disease Mother   . Heart failure Father   . Diabetes Brother   . Cancer Brother        throat  . Hydrocephalus Brother   . Diabetes Brother   . Migraines Sister     Past Surgical History:  Procedure Laterality Date  . CATARACT EXTRACTION, BILATERAL Bilateral   . COLONOSCOPY WITH PROPOFOL N/A 10/29/2014   Procedure: COLONOSCOPY WITH PROPOFOL;  Surgeon: Garlan Fair, MD;  Location: [facility];  Service: Endoscopy;  Laterality: N/A;  . EYE SURGERY    . LUMBAR LAMINECTOMY/DECOMPRESSION MICRODISCECTOMY N/A 10/01/2015   Procedure: LEFT L2-3 MICRODISCECTOMY;  Surgeon: Jessy Oto, MD;  Location: [facility];  Service: Orthopedics;  Laterality: N/A;   Social History   Occupational History  . Not on file  Tobacco Use  . Smoking status: Former Smoker    Years: 42.00    Types: Cigars    Quit date: 10/22/2010    Years since quitting: 9.5  . Smokeless tobacco: Never Used  Vaping Use  . Vaping Use: Never used  Substance and Sexual Activity  . Alcohol use: No    Alcohol/week: 0.0 standard drinks  . Drug use: No  . Sexual activity: Never    Birth control/protection: Abstinence

## 2020-06-19 ENCOUNTER — Telehealth: Payer: Self-pay

## 2020-06-19 ENCOUNTER — Other Ambulatory Visit: Payer: Self-pay

## 2020-06-19 ENCOUNTER — Encounter: Payer: Self-pay | Admitting: Orthopaedic Surgery

## 2020-06-19 ENCOUNTER — Ambulatory Visit (INDEPENDENT_AMBULATORY_CARE_PROVIDER_SITE_OTHER): Payer: Medicare HMO | Admitting: Orthopaedic Surgery

## 2020-06-19 VITALS — Ht 70.5 in | Wt 230.0 lb

## 2020-06-19 DIAGNOSIS — M170 Bilateral primary osteoarthritis of knee: Secondary | ICD-10-CM | POA: Diagnosis not present

## 2020-06-19 NOTE — Telephone Encounter (Signed)
Yes ma'am. I do remember getting that message. Will you hold on to the request until then or do I have to send another message after the beginning of the new year?

## 2020-06-19 NOTE — Telephone Encounter (Signed)
We are not submitting for gel injections the remainder of the year. Will start submitting after 07/16/2020.

## 2020-06-19 NOTE — Telephone Encounter (Signed)
Please precert for bilateral visco injections. Thanks

## 2020-06-19 NOTE — Progress Notes (Signed)
Office Visit Note   Patient: Kenneth Harper           Date of Birth: Nov 26, 1940           MRN: [id number] Visit Date: 06/19/2020              Requested by: Wenda Low, MD [address] [place],  [place] [address] PCP: Wenda Low, MD   Assessment & Plan: Visit Diagnoses:  1. Bilateral primary osteoarthritis of knee     Plan: Mr. Rayos has advanced end-stage osteoarthritis of both knees.  He has had cortisone in the past with little if any relief.  His last injections were approximately 6 weeks ago.  Prior films in 2019 demonstrated end-stage changes in all 3 compartments.  Long discussion today over 30 minutes regarding all of the above including knee replacement as definitive treatment.  He would like to try viscosupplementation so we will call and see if we can get this precertified. This patient is diagnosed with osteoarthritis of the knee(s).    Radiographs show evidence of joint space narrowing, osteophytes, subchondral sclerosis and/or subchondral cysts.  This patient has knee pain which interferes with functional and activities of daily living.    This patient has experienced inadequate response, adverse effects and/or intolerance with conservative treatments such as acetaminophen, NSAIDS, topical creams, physical therapy or regular exercise, knee bracing and/or weight loss.   This patient has experienced inadequate response or has a contraindication to intra articular steroid injections for at least 3 months.   This patient is not scheduled to have a total knee replacement within 6 months of starting treatment with viscosupplementation.   Follow-Up Instructions: Return Pre-CERT Visco supplementation.   Orders:  No orders of the defined types were placed in this encounter.  No orders of the defined types were placed in this encounter.     Procedures: No procedures performed   Clinical Data: No additional findings.   Subjective: Chief  Complaint  Patient presents with  . Right Knee - Pain  . Left Knee - Pain  Patient presents today for bilateral knee pain. He was here in October and had both knees injected. He said that the injections only gave him relief for a week. Both knees hurt equally the same. Both knees pop when walking, and he has noticed that his right knee wants to give way after any amount of standing. He has taken Tylenol or Aleve as needed.  Prior films demonstrated end-stage changes of arthritis in both knees  HPI  Review of Systems   Objective: Vital Signs: Ht 5' 10.5" (1.791 m)   Wt 230 lb (104.3 kg)   BMI 32.54 kg/m   Physical Exam Constitutional:      Appearance: He is well-developed.  Eyes:     Pupils: Pupils are equal, round, and reactive to light.  Pulmonary:     Effort: Pulmonary effort is normal.  Skin:    General: Skin is warm and dry.  Neurological:     Mental Status: He is alert and oriented to person, place, and time.  Psychiatric:        Behavior: Behavior normal.     Ortho Exam awake alert and oriented x3.  Comfortable sitting.  Both knees lack extension by less than 5 degrees and flex about 105 degrees.  No instability.  Increased varus.  Predominately medial joint pain.  Some popliteal fullness bilaterally probably consistent with popliteal cyst.  No calf pain or distal edema.  Bilateral patella crepitation without pain on compression  Specialty Comments:  No specialty comments available.  Imaging: No results found.   PMFS History: Patient Active Problem List   Diagnosis Date Noted  . Bilateral primary osteoarthritis of knee 08/10/2019  . Carpal tunnel syndrome on both sides 07/28/2018  . Lumbar herniated disc 10/01/2015  . Herniation of lumbar intervertebral disc with radiculopathy 09/30/2015    Class: Acute   Past Medical History:  Diagnosis Date  . Arthritis    knees and back  . Asthma    childhood  . Carpal tunnel syndrome   . Cataract   . Diverticulitis    . GERD (gastroesophageal reflux disease)   . Hypercholesterolemia   . Memory loss     Family History  Problem Relation Age of Onset  . Parkinson's disease Mother   . Heart failure Father   . Diabetes Brother   . Cancer Brother        throat  . Hydrocephalus Brother   . Diabetes Brother   . Migraines Sister     Past Surgical History:  Procedure Laterality Date  . CATARACT EXTRACTION, BILATERAL Bilateral   . COLONOSCOPY WITH PROPOFOL N/A 10/29/2014   Procedure: COLONOSCOPY WITH PROPOFOL;  Surgeon: Garlan Fair, MD;  [facility]: [facility];  Service: Endoscopy;  Laterality: N/A;  . EYE SURGERY    . LUMBAR LAMINECTOMY/DECOMPRESSION MICRODISCECTOMY N/A 10/01/2015   Procedure: LEFT L2-3 MICRODISCECTOMY;  Surgeon: Jessy Oto, MD;  Location: [facility];  Service: Orthopedics;  Laterality: N/A;   Social History   Occupational History  . Not on file  Tobacco Use  . Smoking status: Former Smoker    Years: 42.00    Types: Cigars    Quit date: 10/22/2010    Years since quitting: 9.6  . Smokeless tobacco: Never Used  Vaping Use  . Vaping Use: Never used  Substance and Sexual Activity  . Alcohol use: No    Alcohol/week: 0.0 standard drinks  . Drug use: No  . Sexual activity: Never    Birth control/protection: Abstinence

## 2020-07-18 DIAGNOSIS — H6123 Impacted cerumen, bilateral: Secondary | ICD-10-CM | POA: Diagnosis not present

## 2020-07-31 ENCOUNTER — Other Ambulatory Visit: Payer: Self-pay

## 2020-07-31 ENCOUNTER — Inpatient Hospital Stay (HOSPITAL_COMMUNITY)
Admission: EM | Admit: 2020-07-31 | Discharge: 2020-08-06 | DRG: 064 | Disposition: A | Discharge status: Rehabilitation Facility | Payer: Medicare HMO | Attending: Family Medicine | Admitting: Family Medicine

## 2020-07-31 ENCOUNTER — Inpatient Hospital Stay (HOSPITAL_COMMUNITY): Payer: Medicare HMO

## 2020-07-31 ENCOUNTER — Encounter (HOSPITAL_COMMUNITY): Payer: Self-pay

## 2020-07-31 ENCOUNTER — Emergency Department (HOSPITAL_COMMUNITY): Payer: Medicare HMO

## 2020-07-31 DIAGNOSIS — Z6832 Body mass index (BMI) 32.0-32.9, adult: Secondary | ICD-10-CM | POA: Diagnosis not present

## 2020-07-31 DIAGNOSIS — Z8673 Personal history of transient ischemic attack (TIA), and cerebral infarction without residual deficits: Secondary | ICD-10-CM | POA: Diagnosis not present

## 2020-07-31 DIAGNOSIS — R531 Weakness: Secondary | ICD-10-CM | POA: Diagnosis present

## 2020-07-31 DIAGNOSIS — E669 Obesity, unspecified: Secondary | ICD-10-CM | POA: Diagnosis present

## 2020-07-31 DIAGNOSIS — G936 Cerebral edema: Secondary | ICD-10-CM | POA: Diagnosis present

## 2020-07-31 DIAGNOSIS — R0989 Other specified symptoms and signs involving the circulatory and respiratory systems: Secondary | ICD-10-CM | POA: Diagnosis not present

## 2020-07-31 DIAGNOSIS — R29700 NIHSS score 0: Secondary | ICD-10-CM | POA: Diagnosis present

## 2020-07-31 DIAGNOSIS — E161 Other hypoglycemia: Secondary | ICD-10-CM | POA: Diagnosis not present

## 2020-07-31 DIAGNOSIS — W19XXXA Unspecified fall, initial encounter: Secondary | ICD-10-CM | POA: Diagnosis present

## 2020-07-31 DIAGNOSIS — G319 Degenerative disease of nervous system, unspecified: Secondary | ICD-10-CM | POA: Diagnosis not present

## 2020-07-31 DIAGNOSIS — I639 Cerebral infarction, unspecified: Secondary | ICD-10-CM | POA: Diagnosis not present

## 2020-07-31 DIAGNOSIS — I619 Nontraumatic intracerebral hemorrhage, unspecified: Secondary | ICD-10-CM | POA: Diagnosis not present

## 2020-07-31 DIAGNOSIS — R7309 Other abnormal glucose: Secondary | ICD-10-CM | POA: Diagnosis not present

## 2020-07-31 DIAGNOSIS — N179 Acute kidney failure, unspecified: Secondary | ICD-10-CM

## 2020-07-31 DIAGNOSIS — R29810 Facial weakness: Secondary | ICD-10-CM | POA: Diagnosis not present

## 2020-07-31 DIAGNOSIS — N182 Chronic kidney disease, stage 2 (mild): Secondary | ICD-10-CM | POA: Diagnosis not present

## 2020-07-31 DIAGNOSIS — K219 Gastro-esophageal reflux disease without esophagitis: Secondary | ICD-10-CM | POA: Diagnosis present

## 2020-07-31 DIAGNOSIS — Z809 Family history of malignant neoplasm, unspecified: Secondary | ICD-10-CM

## 2020-07-31 DIAGNOSIS — I160 Hypertensive urgency: Secondary | ICD-10-CM | POA: Diagnosis present

## 2020-07-31 DIAGNOSIS — H269 Unspecified cataract: Secondary | ICD-10-CM | POA: Diagnosis present

## 2020-07-31 DIAGNOSIS — R9431 Abnormal electrocardiogram [ECG] [EKG]: Secondary | ICD-10-CM | POA: Diagnosis not present

## 2020-07-31 DIAGNOSIS — R519 Headache, unspecified: Secondary | ICD-10-CM | POA: Diagnosis not present

## 2020-07-31 DIAGNOSIS — R509 Fever, unspecified: Secondary | ICD-10-CM | POA: Diagnosis not present

## 2020-07-31 DIAGNOSIS — E162 Hypoglycemia, unspecified: Secondary | ICD-10-CM | POA: Diagnosis not present

## 2020-07-31 DIAGNOSIS — I610 Nontraumatic intracerebral hemorrhage in hemisphere, subcortical: Secondary | ICD-10-CM | POA: Diagnosis not present

## 2020-07-31 DIAGNOSIS — Z79899 Other long term (current) drug therapy: Secondary | ICD-10-CM

## 2020-07-31 DIAGNOSIS — R030 Elevated blood-pressure reading, without diagnosis of hypertension: Secondary | ICD-10-CM

## 2020-07-31 DIAGNOSIS — Z820 Family history of epilepsy and other diseases of the nervous system: Secondary | ICD-10-CM

## 2020-07-31 DIAGNOSIS — Z7982 Long term (current) use of aspirin: Secondary | ICD-10-CM | POA: Diagnosis not present

## 2020-07-31 DIAGNOSIS — M1711 Unilateral primary osteoarthritis, right knee: Secondary | ICD-10-CM | POA: Diagnosis present

## 2020-07-31 DIAGNOSIS — E785 Hyperlipidemia, unspecified: Secondary | ICD-10-CM | POA: Diagnosis not present

## 2020-07-31 DIAGNOSIS — R350 Frequency of micturition: Secondary | ICD-10-CM | POA: Diagnosis not present

## 2020-07-31 DIAGNOSIS — R7401 Elevation of levels of liver transaminase levels: Secondary | ICD-10-CM | POA: Diagnosis not present

## 2020-07-31 DIAGNOSIS — E871 Hypo-osmolality and hyponatremia: Secondary | ICD-10-CM | POA: Diagnosis not present

## 2020-07-31 DIAGNOSIS — I129 Hypertensive chronic kidney disease with stage 1 through stage 4 chronic kidney disease, or unspecified chronic kidney disease: Secondary | ICD-10-CM | POA: Diagnosis not present

## 2020-07-31 DIAGNOSIS — M25561 Pain in right knee: Secondary | ICD-10-CM | POA: Diagnosis present

## 2020-07-31 DIAGNOSIS — H9190 Unspecified hearing loss, unspecified ear: Secondary | ICD-10-CM | POA: Diagnosis present

## 2020-07-31 DIAGNOSIS — I10 Essential (primary) hypertension: Secondary | ICD-10-CM | POA: Diagnosis not present

## 2020-07-31 DIAGNOSIS — Z833 Family history of diabetes mellitus: Secondary | ICD-10-CM | POA: Diagnosis not present

## 2020-07-31 DIAGNOSIS — I959 Hypotension, unspecified: Secondary | ICD-10-CM | POA: Diagnosis not present

## 2020-07-31 DIAGNOSIS — S8001XD Contusion of right knee, subsequent encounter: Secondary | ICD-10-CM | POA: Diagnosis not present

## 2020-07-31 DIAGNOSIS — Z8249 Family history of ischemic heart disease and other diseases of the circulatory system: Secondary | ICD-10-CM

## 2020-07-31 DIAGNOSIS — I6389 Other cerebral infarction: Secondary | ICD-10-CM | POA: Diagnosis not present

## 2020-07-31 DIAGNOSIS — R413 Other amnesia: Secondary | ICD-10-CM | POA: Diagnosis present

## 2020-07-31 DIAGNOSIS — M25461 Effusion, right knee: Secondary | ICD-10-CM | POA: Diagnosis not present

## 2020-07-31 DIAGNOSIS — R52 Pain, unspecified: Secondary | ICD-10-CM

## 2020-07-31 DIAGNOSIS — R739 Hyperglycemia, unspecified: Secondary | ICD-10-CM | POA: Diagnosis not present

## 2020-07-31 DIAGNOSIS — S8991XA Unspecified injury of right lower leg, initial encounter: Secondary | ICD-10-CM | POA: Diagnosis not present

## 2020-07-31 DIAGNOSIS — J45909 Unspecified asthma, uncomplicated: Secondary | ICD-10-CM | POA: Diagnosis present

## 2020-07-31 DIAGNOSIS — R7303 Prediabetes: Secondary | ICD-10-CM | POA: Diagnosis not present

## 2020-07-31 DIAGNOSIS — M109 Gout, unspecified: Secondary | ICD-10-CM | POA: Diagnosis present

## 2020-07-31 DIAGNOSIS — I69151 Hemiplegia and hemiparesis following nontraumatic intracerebral hemorrhage affecting right dominant side: Secondary | ICD-10-CM | POA: Diagnosis not present

## 2020-07-31 DIAGNOSIS — Z20822 Contact with and (suspected) exposure to covid-19: Secondary | ICD-10-CM | POA: Diagnosis not present

## 2020-07-31 DIAGNOSIS — T380X5A Adverse effect of glucocorticoids and synthetic analogues, initial encounter: Secondary | ICD-10-CM | POA: Diagnosis not present

## 2020-07-31 DIAGNOSIS — Z87891 Personal history of nicotine dependence: Secondary | ICD-10-CM

## 2020-07-31 DIAGNOSIS — E876 Hypokalemia: Secondary | ICD-10-CM | POA: Diagnosis not present

## 2020-07-31 HISTORY — DX: Cerebral infarction, unspecified: I63.9

## 2020-07-31 LAB — COMPREHENSIVE METABOLIC PANEL
ALT: 17 U/L (ref 0–44)
AST: 26 U/L (ref 15–41)
Albumin: 3.5 g/dL (ref 3.5–5.0)
Alkaline Phosphatase: 71 U/L (ref 38–126)
Anion gap: 11 (ref 5–15)
BUN: 15 mg/dL (ref 8–23)
CO2: 20 mmol/L — ABNORMAL LOW (ref 22–32)
Calcium: 8.9 mg/dL (ref 8.9–10.3)
Chloride: 108 mmol/L (ref 98–111)
Creatinine, Ser: 1.35 mg/dL — ABNORMAL HIGH (ref 0.61–1.24)
GFR, Estimated: 54 mL/min — ABNORMAL LOW (ref >60)
Glucose, Bld: 124 mg/dL — ABNORMAL HIGH (ref 70–99)
Potassium: 4 mmol/L (ref 3.5–5.1)
Sodium: 139 mmol/L (ref 135–145)
Total Bilirubin: 0.6 mg/dL (ref 0.3–1.2)
Total Protein: 6.5 g/dL (ref 6.5–8.1)

## 2020-07-31 LAB — DIFFERENTIAL
Abs Immature Granulocytes: 0.02 10*3/uL (ref 0.00–0.07)
Basophils Absolute: 0 10*3/uL (ref 0.0–0.1)
Basophils Relative: 0 %
Eosinophils Absolute: 0.1 10*3/uL (ref 0.0–0.5)
Eosinophils Relative: 1 %
Immature Granulocytes: 0 %
Lymphocytes Relative: 22 %
Lymphs Abs: 1.2 10*3/uL (ref 0.7–4.0)
Monocytes Absolute: 0.7 10*3/uL (ref 0.1–1.0)
Monocytes Relative: 12 %
Neutro Abs: 3.6 10*3/uL (ref 1.7–7.7)
Neutrophils Relative %: 65 %

## 2020-07-31 LAB — PROTIME-INR
INR: 1.1 (ref 0.8–1.2)
Prothrombin Time: 13.8 seconds (ref 11.4–15.2)

## 2020-07-31 LAB — RESP PANEL BY RT-PCR (FLU A&B, COVID) ARPGX2
Influenza A by PCR: NEGATIVE
Influenza B by PCR: NEGATIVE
SARS Coronavirus 2 by RT PCR: NEGATIVE

## 2020-07-31 LAB — CBC
HCT: 46.3 % (ref 39.0–52.0)
Hemoglobin: 14.9 g/dL (ref 13.0–17.0)
MCH: 29.5 pg (ref 26.0–34.0)
MCHC: 32.2 g/dL (ref 30.0–36.0)
MCV: 91.7 fL (ref 80.0–100.0)
Platelets: 185 10*3/uL (ref 150–400)
RBC: 5.05 MIL/uL (ref 4.22–5.81)
RDW: 13.7 % (ref 11.5–15.5)
WBC: 5.6 10*3/uL (ref 4.0–10.5)
nRBC: 0 % (ref 0.0–0.2)

## 2020-07-31 LAB — APTT: aPTT: 31 seconds (ref 24–36)

## 2020-07-31 LAB — ETHANOL: Alcohol, Ethyl (B): <10 mg/dL (ref <10)

## 2020-07-31 MED ORDER — ACETAMINOPHEN 160 MG/5ML PO SOLN: 650.0000 mg | ORAL | Status: DC | PRN

## 2020-07-31 MED ORDER — LABETALOL HCL 5 MG/ML IV SOLN
10.0000 mg | Freq: Once | INTRAVENOUS | Status: AC
  Administered 2020-07-31: 10 mg via INTRAVENOUS
  Filled 2020-07-31: qty 4

## 2020-07-31 MED ORDER — NICARDIPINE HCL IN NACL 20-0.86 MG/200ML-% IV SOLN
3.0000 mg/h | INTRAVENOUS | Status: DC
  Administered 2020-07-31 – 2020-08-01 (×4): 5 mg/h via INTRAVENOUS
  Administered 2020-08-02: 10 mg/h via INTRAVENOUS
  Administered 2020-08-02: 15 mg/h via INTRAVENOUS
  Administered 2020-08-02: 10 mg/h via INTRAVENOUS
  Administered 2020-08-02: 15 mg/h via INTRAVENOUS
  Filled 2020-07-31 (×12): qty 200

## 2020-07-31 MED ORDER — ACETAMINOPHEN 325 MG PO TABS
650.0000 mg | ORAL_TABLET | ORAL | Status: DC | PRN
  Administered 2020-08-01 – 2020-08-02 (×3): 650 mg via ORAL
  Filled 2020-07-31 (×3): qty 2

## 2020-07-31 MED ORDER — STROKE: EARLY STAGES OF RECOVERY BOOK
Freq: Once | Status: AC
  Filled 2020-07-31: qty 1

## 2020-07-31 MED ORDER — ACETAMINOPHEN 650 MG RE SUPP: 650.0000 mg | RECTAL | Status: DC | PRN

## 2020-07-31 MED ORDER — PANTOPRAZOLE SODIUM 40 MG IV SOLR
40.0000 mg | Freq: Every day | INTRAVENOUS | Status: DC
  Administered 2020-08-01 (×2): 40 mg via INTRAVENOUS
  Filled 2020-07-31 (×2): qty 40

## 2020-07-31 MED ORDER — SENNOSIDES-DOCUSATE SODIUM 8.6-50 MG PO TABS
1.0000 | ORAL_TABLET | Freq: Two times a day (BID) | ORAL | Status: DC
  Administered 2020-08-01 – 2020-08-03 (×6): 1 via ORAL
  Filled 2020-07-31 (×7): qty 1

## 2020-07-31 NOTE — ED Notes (Signed)
Pt in CT.

## 2020-07-31 NOTE — ED Notes (Signed)
Patient transported to MRI 

## 2020-07-31 NOTE — H&P (Signed)
Neurology H&P  CC: Numbness  History is obtained from:patient  HPI: Kenneth Harper is a 80 y.o. male with history of memory loss and hypercholesterolemia who presents with numbness that started yesterday.  States that around 1230 or one he was out of his house when he felt like he was unable to do for himself as he normally is, though is very vague about exactly what symptoms he felt.  He states that he now feels almost completely back to normal.  In evaluation of the symptoms,   LKW: 1pm on 1/18 tpa given?: No, ICH IR Thrombectomy? No, ICH Modified Rankin Scale: 0-Completely asymptomatic and back to baseline post- stroke NIHSS: 0   ROS: A complete ROS was performed and is negative except as noted in the HPI.  Past Medical History:  Diagnosis Date  . Arthritis    knees and back  . Asthma    childhood  . Carpal tunnel syndrome   . Cataract   . Diverticulitis   . GERD (gastroesophageal reflux disease)   . Hypercholesterolemia   . Memory loss      Family History  Problem Relation Age of Onset  . Parkinson's disease Mother   . Heart failure Father   . Diabetes Brother   . Cancer Brother        throat  . Hydrocephalus Brother   . Diabetes Brother   . Migraines Sister      Social History:  reports that he quit smoking about 9 years ago. His smoking use included cigars. He quit after 42.00 years of use. He has never used smokeless tobacco. He reports that he does not drink alcohol and does not use drugs.   Prior to Admission medications   Medication Sig Start Date End Date Taking? Authorizing Provider  acetaminophen (TYLENOL) 500 MG tablet Take 1,000 mg by mouth daily as needed for mild pain.    [provider]  acetic acid-hydrocortisone (VOSOL-HC) OTIC solution  09/13/18   [provider]  aspirin 81 MG EC tablet Take 81 mg by mouth every Monday, Wednesday, and Friday at 6 PM. Swallow whole.    [provider]  atorvastatin (LIPITOR) 40 MG  tablet Take 40 mg by mouth daily.  08/22/14   [provider]  Calcium Carbonate-Vitamin D (CALTRATE 600+D PO) Take 1 tablet by mouth daily.    [provider]  Cetirizine HCl (ZYRTEC PO) Take by mouth as needed.    [provider]  Multiple Vitamins-Minerals (MULTIVITAMIN WITH MINERALS) tablet Take 1 tablet by mouth daily.    [provider]  naproxen sodium (ALEVE) 220 MG tablet Take 220 mg by mouth.    [provider]  Omega-3 Fatty Acids (FISH OIL PO) Take 1 tablet by mouth daily.    [provider]  OVER THE COUNTER MEDICATION Apply 1 application topically daily as needed. Hemp Cream    [provider]  oxyCODONE (OXY IR/ROXICODONE) 5 MG immediate release tablet Take 1-2 tablets (5-10 mg total) by mouth every 4 (four) hours as needed for severe pain. 08/11/18   Cherylann Ratel, PA-C     Exam: Current vital signs: BP 139/83 (BP Location: Right Arm)   Pulse 73   Temp 98.2 F (36.8 C) (Oral)   Resp 15   Ht 5\' 11"  (1.803 m)   Wt 104.3 kg   SpO2 96%   BMI 32.08 kg/m    Physical Exam  Constitutional: Appears well-developed and well-nourished.  Psych: Affect appropriate  to situation Eyes: No scleral injection HENT: No OP obstrucion Head: Normocephalic.  Cardiovascular: Normal rate and regular rhythm.  Respiratory: Effort normal and breath sounds normal to anterior ascultation GI: Soft.  No distension. There is no tenderness.  Skin: WDI  Neuro: Mental Status: Patient is awake, alert, oriented to person, place, month, year, and situation. Patient is able to give a clear and coherent history. No signs of neglect. He has an increased latency of response.  Cranial Nerves: II: Visual Fields are full. Pupils are equal, round, and reactive to light.   III,IV, VI: EOMI without ptosis or diplopia.  V: Facial sensation is symmetric to temperature VII: Facial movement is symmetric.  VIII: hearing is intact to voice X:  Uvula is midline and palate elevates symmetrically XI: Shoulder shrug is symmetric. XII: tongue is midline without atrophy or fasciculations.  Motor: Tone is normal. Bulk is normal. 5/5 strength was present in all four extremities.  Sensory: Sensation is symmetric to light touch and temperature in the arms and legs. Cerebellar: FNF intact bilaterally   I have reviewed labs in epic and the pertinent results are: Creatinine 1.35  I have reviewed the images obtained: CT head-thalamic hemorrhage  Primary Diagnosis:  Nontraumatic intracerebral hemorrhage in hemisphere, subcortical  Secondary Diagnosis: Hypertensive urgency  Impression: 80 year old male with thalamic hemorrhage that based on location is likely hypertensive in nature, though he does not have a known history of hypertension.  His blood pressure has been outside of goal and therefore he has been started on nicardipine and I would maintain strict regimentation through tomorrow.  Given that he has not been severely hypertensive and does not have a known history of hypertension, I will get an MRI to investigate other causes as well such as secondary hemorrhagic infarct.  My suspicion, however, there is that this represents a primary thalamic hemorrhage.  Plan: 1) Admit to ICU 2) no antiplatelets or anticoagulants 3) blood pressure control with goal systolic 341 [phone] 4) Frequent neuro checks 5) If symptoms worsen or there is decreased mental status, repeat stat head CT 6) PT,OT,ST   This patient is critically ill and at significant risk of neurological worsening, death and care requires constant monitoring of vital signs, hemodynamics,respiratory and cardiac monitoring, neurological assessment, discussion with family, other specialists and medical decision making of high complexity. I spent 50 minutes of neurocritical care time  in the care of  this patient. This was time spent independent of any time provided by nurse  practitioner or PA.  Roland Rack, MD [facility] [phone]  If 7pm- 7am, please page neurology on call as listed in [place].

## 2020-07-31 NOTE — ED Provider Notes (Signed)
[facility] EMERGENCY DEPARTMENT Provider Note   CSN: [id number] Arrival date & time: 07/31/20  0957     History Chief Complaint  Patient presents with  . Headache  . Fall    Kenneth Harper is a 80 y.o. male.  Patient presents to the emergency department with his son today for evaluation of new onset of confusion.  Patient also has a frontal headache.  Around noon to 1 PM yesterday, the patient developed confusion and did not feel like himself.  He could walk, talk, move arms and legs without any difficulties.  He was very forgetful though.  Per son's report, he went to go to work and sat outside in the garage for an hour before returning, stating that he forgot his keys.  He was acting unusually.  There may have been a fall yesterday as well after symptoms started.  Symptoms persisted during the evening and into this morning, prompting emergency department visit.  Patient describes a frontal headache worse on the left side.  No vomiting or neck pain.  No weakness, numbness, or tingling at the current time in the arms or legs.  He has been talking without speech changes.  Patient is not on any anticoagulants and does not take aspirin.  He does not have a history of high blood pressure.  His only medications are for high cholesterol.          Past Medical History:  Diagnosis Date  . Arthritis    knees and back  . Asthma    childhood  . Carpal tunnel syndrome   . Cataract   . Diverticulitis   . GERD (gastroesophageal reflux disease)   . Hypercholesterolemia   . Memory loss     Patient Active Problem List   Diagnosis Date Noted  . Bilateral primary osteoarthritis of knee 08/10/2019  . Carpal tunnel syndrome on both sides 07/28/2018  . Lumbar herniated disc 10/01/2015  . Herniation of lumbar intervertebral disc with radiculopathy 09/30/2015    Class: Acute    Past Surgical History:  Procedure Laterality Date  . CATARACT EXTRACTION, BILATERAL Bilateral    . COLONOSCOPY WITH PROPOFOL N/A 10/29/2014   Procedure: COLONOSCOPY WITH PROPOFOL;  Surgeon: Garlan Fair, MD;  Location: [facility];  Service: Endoscopy;  Laterality: N/A;  . EYE SURGERY    . LUMBAR LAMINECTOMY/DECOMPRESSION MICRODISCECTOMY N/A 10/01/2015   Procedure: LEFT L2-3 MICRODISCECTOMY;  Surgeon: Jessy Oto, MD;  Location: [facility];  Service: Orthopedics;  Laterality: N/A;       Family History  Problem Relation Age of Onset  . Parkinson's disease Mother   . Heart failure Father   . Diabetes Brother   . Cancer Brother        throat  . Hydrocephalus Brother   . Diabetes Brother   . Migraines Sister     Social History   Tobacco Use  . Smoking status: Former Smoker    Years: 42.00    Types: Cigars    Quit date: 10/22/2010    Years since quitting: 9.7  . Smokeless tobacco: Never Used  Vaping Use  . Vaping Use: Never used  Substance Use Topics  . Alcohol use: No    Alcohol/week: 0.0 standard drinks  . Drug use: No    Home Medications Prior to Admission medications   Medication Sig Start Date End Date Taking? Authorizing Provider  acetaminophen (TYLENOL) 500 MG tablet Take 1,000 mg by mouth daily as needed for mild pain.  [provider]  acetic acid-hydrocortisone (VOSOL-HC) OTIC solution  09/13/18   [provider]  aspirin 81 MG EC tablet Take 81 mg by mouth every Monday, Wednesday, and Friday at 6 PM. Swallow whole.    [provider]  atorvastatin (LIPITOR) 40 MG tablet Take 40 mg by mouth daily.  08/22/14   [provider]  Calcium Carbonate-Vitamin D (CALTRATE 600+D PO) Take 1 tablet by mouth daily.    [provider]  Cetirizine HCl (ZYRTEC PO) Take by mouth as needed.    [provider]  Multiple Vitamins-Minerals (MULTIVITAMIN WITH MINERALS) tablet Take 1 tablet by mouth daily.    [provider]  naproxen sodium (ALEVE) 220 MG tablet Take 220 mg by mouth.    [provider]   Omega-3 Fatty Acids (FISH OIL PO) Take 1 tablet by mouth daily.    [provider]  OVER THE COUNTER MEDICATION Apply 1 application topically daily as needed. Hemp Cream    [provider]  oxyCODONE (OXY IR/ROXICODONE) 5 MG immediate release tablet Take 1-2 tablets (5-10 mg total) by mouth every 4 (four) hours as needed for severe pain. 08/11/18   Cherylann Ratel, PA-C    Allergies    Patient has no known allergies.  Review of Systems   Review of Systems  Constitutional: Negative for fever.  HENT: Negative for congestion, dental problem, rhinorrhea and sinus pressure.   Eyes: Negative for photophobia, discharge, redness and visual disturbance.  Respiratory: Negative for shortness of breath.   Cardiovascular: Negative for chest pain.  Gastrointestinal: Negative for nausea and vomiting.  Musculoskeletal: Negative for gait problem, neck pain and neck stiffness.  Skin: Negative for rash.  Neurological: Positive for headaches. Negative for tremors, seizures, syncope, facial asymmetry, speech difficulty, weakness, light-headedness and numbness.  Psychiatric/Behavioral: Positive for confusion.    Physical Exam Updated Vital Signs BP (!) 154/96 (BP Location: Right Arm)   Pulse 92   Temp 98.2 F (36.8 C) (Oral)   Resp (!) 23   Ht 5\' 11"  (1.803 m)   Wt 104.3 kg   SpO2 99%   BMI 32.08 kg/m   Physical Exam Vitals and nursing note reviewed.  Constitutional:      Appearance: He is well-developed and well-nourished.  HENT:     Head: Normocephalic and atraumatic.     Right Ear: Tympanic membrane, ear canal and external ear normal.     Left Ear: Tympanic membrane, ear canal and external ear normal.     Nose: Nose normal.     Mouth/Throat:     Mouth: Oropharynx is clear and moist and mucous membranes are normal.     Pharynx: Uvula midline.  Eyes:     General: Lids are normal.     Extraocular Movements: EOM normal.     Conjunctiva/sclera: Conjunctivae normal.      Pupils: Pupils are equal, round, and reactive to light.  Cardiovascular:     Rate and Rhythm: Normal rate and regular rhythm.  Pulmonary:     Effort: Pulmonary effort is normal.     Breath sounds: Normal breath sounds.  Abdominal:     Palpations: Abdomen is soft.     Tenderness: There is no abdominal tenderness.  Musculoskeletal:        General: Normal range of motion.     Cervical back: Normal range of motion and neck supple. No tenderness or bony tenderness. Normal range of motion.  Skin:    General: Skin is warm  and dry.  Neurological:     Mental Status: He is alert and oriented to person, place, and time.     GCS: GCS eye subscore is 4. GCS verbal subscore is 5. GCS motor subscore is 6.     Cranial Nerves: No cranial nerve deficit.     Sensory: No sensory deficit.     Motor: No abnormal muscle tone.     Coordination: He displays a negative Romberg sign. Coordination normal.     Deep Tendon Reflexes: Strength normal.  Psychiatric:        Mood and Affect: Mood and affect normal.     ED Results / Procedures / Treatments   Labs (all labs ordered are listed, but only abnormal results are displayed) Labs Reviewed  COMPREHENSIVE METABOLIC PANEL - Abnormal; Notable for the following components:      Result Value   CO2 20 (*)    Glucose, Bld 124 (*)    Creatinine, Ser 1.35 (*)    GFR, Estimated 54 (*)    All other components within normal limits  RESP PANEL BY RT-PCR (FLU A&B, COVID) ARPGX2  ETHANOL  PROTIME-INR  APTT  CBC  DIFFERENTIAL  RAPID URINE DRUG SCREEN, HOSP PERFORMED  URINALYSIS, ROUTINE W REFLEX MICROSCOPIC    ED ECG REPORT   Date: 07/31/2020  Rate: 78  Rhythm: normal sinus rhythm  QRS Axis: normal  Intervals: normal  ST/T Wave abnormalities: normal  Conduction Disutrbances:none  Narrative Interpretation:   Old EKG Reviewed: unchanged from 09/2015  I have personally reviewed the EKG tracing and agree with the computerized printout as  noted.  Radiology CT Head Wo Contrast  Result Date: 07/31/2020 CLINICAL DATA:  Head trauma.  Left-sided facial droop. EXAM: CT HEAD WITHOUT CONTRAST TECHNIQUE: Contiguous axial images were obtained from the base of the skull through the vertex without intravenous contrast. COMPARISON:  Brain MRI 04/12/2018 FINDINGS: Brain: Acute hemorrhage in the left thalamus, 23 x 35 x 14 mm. Small volume intraventricular extension in the dependent lateral ventricles. No hydrocephalus. No visible infarct, mass, or extra-axial collection. Vascular: No hyperdense vessel or unexpected calcification. Skull: Normal. Negative for fracture or focal lesion. Sinuses/Orbits: No evidence of injury.  Bilateral cataract resection Other: Critical Value/emergent results were called by telephone at the time of interpretation on 07/31/2020 at 10:49 am to provider Tyrone Nine, who verbally acknowledged these results. IMPRESSION: 6 cc acute hematoma in the left thalamus. Small volume intraventricular extension without hydrocephalus. Electronically Signed   By: Monte Fantasia M.D.   On: 07/31/2020 10:53    Procedures Procedures (including critical care time)  Medications Ordered in ED Medications  acetaminophen (TYLENOL) tablet 650 mg (has no administration in time range)    Or  acetaminophen (TYLENOL) 160 MG/5ML solution 650 mg (has no administration in time range)    Or  acetaminophen (TYLENOL) suppository 650 mg (has no administration in time range)  senna-docusate (Senokot-S) tablet 1 tablet (1 tablet Oral Not Given 07/31/20 1201)  pantoprazole (PROTONIX) injection 40 mg (has no administration in time range)  nicardipine (CARDENE) 20mg  in 0.86% saline 24ml IV infusion (0.1 mg/ml) (has no administration in time range)  labetalol (NORMODYNE) injection 10 mg (10 mg Intravenous Given 07/31/20 1123)   stroke: mapping our early stages of recovery book ( Does not apply Given 07/31/20 1223)    ED Course  I have reviewed the triage vital  signs and the nursing notes.  Pertinent labs & imaging results that were available during my care of the  patient were reviewed by me and considered in my medical decision making (see chart for details).  Patient seen and examined. CT reviewed. Pt with acute hemorrhagic CVA. BP last check 147/91. No anticoagulation. Confusion but otherwise normal gross neuro exam. Symptoms started between 12p and 1p yesterday. Pt not tpa candidate 2/2 acute hemorrhage. Will discuss with neuro hospitalist.  CT reviewed with Dr. Tyrone Nine.  Vital signs reviewed and are as follows: BP (!) 154/96 (BP Location: Right Arm)   Pulse 92   Temp 98.2 F (36.8 C) (Oral)   Resp (!) 23   Ht 5\' 11"  (1.803 m)   Wt 104.3 kg   SpO2 99%   BMI 32.08 kg/m   11:12 AM Discussed with Dr. Leonel Ramsay who has reviewed imaging. Plan for neuro ICU admit. Requests BP control goal 140. Labetalol ordered -- if necessary will place on cleviprex.   11:37 AM Rechecked after labetalol. BP 139/83. Pt and son updated.   12:46 PM Neuro has seen -- ordered drip as BP is going higher. Plan admit to ICU.   BP (!) 160/101   Pulse 75   Temp 98.2 F (36.8 C) (Oral)   Resp 19   Ht 5\' 11"  (1.803 m)   Wt 104.3 kg   SpO2 97%   BMI 32.08 kg/m '  CRITICAL CARE Performed by: Carlisle Cater PA-C Total critical care time: 40 minutes Critical care time was exclusive of separately billable procedures and treating other patients. Critical care was necessary to treat or prevent imminent or life-threatening deterioration. Critical care was time spent personally by me on the following activities: development of treatment plan with patient and/or surrogate as well as nursing, discussions with consultants, evaluation of patient's response to treatment, examination of patient, obtaining history from patient or surrogate, ordering and performing treatments and interventions, ordering and review of laboratory studies, ordering and review of radiographic studies,  pulse oximetry and re-evaluation of patient's condition.    MDM Rules/Calculators/A&P                          Admit.   Final Clinical Impression(s) / ED Diagnoses Final diagnoses:  Hemorrhagic stroke [facility])    Rx / DC Orders ED Discharge Orders    None       Carlisle Cater, PA-C 07/31/20 [place], DO 07/31/20 1303

## 2020-07-31 NOTE — ED Triage Notes (Signed)
Pt reports frontal headache since yesterday, pts family reported an unwitnessed fall yesterday as well that they want to get evaluated, pt denies LOC. Minimal pain at this time. Pt a.o

## 2020-08-01 ENCOUNTER — Other Ambulatory Visit (HOSPITAL_COMMUNITY): Payer: Medicare HMO

## 2020-08-01 LAB — LIPID PANEL
Cholesterol: 123 mg/dL (ref 0–200)
HDL: 34 mg/dL — ABNORMAL LOW (ref >40)
LDL Cholesterol: 72 mg/dL (ref 0–99)
Total CHOL/HDL Ratio: 3.6 RATIO
Triglycerides: 86 mg/dL (ref <150)
VLDL: 17 mg/dL (ref 0–40)

## 2020-08-01 LAB — URINALYSIS, ROUTINE W REFLEX MICROSCOPIC
Bilirubin Urine: NEGATIVE
Glucose, UA: NEGATIVE mg/dL
Hgb urine dipstick: NEGATIVE
Ketones, ur: 5 mg/dL — AB
Leukocytes,Ua: NEGATIVE
Nitrite: NEGATIVE
Protein, ur: 30 mg/dL — AB
Specific Gravity, Urine: 1.017 (ref 1.005–1.030)
pH: 5 (ref 5.0–8.0)

## 2020-08-01 LAB — HEMOGLOBIN A1C
Hgb A1c MFr Bld: 5.8 % — ABNORMAL HIGH (ref 4.8–5.6)
Mean Plasma Glucose: 119.76 mg/dL

## 2020-08-01 LAB — RAPID URINE DRUG SCREEN, HOSP PERFORMED
Amphetamines: NOT DETECTED
Barbiturates: NOT DETECTED
Benzodiazepines: NOT DETECTED
Cocaine: NOT DETECTED
Opiates: NOT DETECTED
Tetrahydrocannabinol: NOT DETECTED

## 2020-08-01 LAB — MRSA PCR SCREENING: MRSA by PCR: NEGATIVE

## 2020-08-01 MED ORDER — AMLODIPINE BESYLATE 5 MG PO TABS
5.0000 mg | ORAL_TABLET | Freq: Every day | ORAL | Status: DC
  Administered 2020-08-01 – 2020-08-06 (×6): 5 mg via ORAL
  Filled 2020-08-01 (×6): qty 1

## 2020-08-01 MED ORDER — CHLORHEXIDINE GLUCONATE CLOTH 2 % EX PADS
6.0000 | MEDICATED_PAD | Freq: Every day | CUTANEOUS | Status: DC
  Administered 2020-08-01 – 2020-08-02 (×2): 6 via TOPICAL

## 2020-08-01 NOTE — Progress Notes (Signed)
STROKE TEAM PROGRESS NOTE   INTERVAL HISTORY No acute events since arrival. Daughter is at bedside during our visit.  Reports no new fall related concerns (new pain, swelling etc...) He denies previous history of HTN or treatments for HTN.  Stroke diagnosis and plan of care discussed. All questions answered.  Patient has remained neurologically stable since admission.  Blood pressures controlled on Cleviprex drip.  MRI scan shows stable appearance of the left thalamic hemorrhage without significant intraventricular extension or hydrocephalus. Vitals:   08/01/20 1130 08/01/20 1145 08/01/20 1215 08/01/20 1300  BP: 118/75 120/76  129/74  Pulse: 84 84 90 (!) 58  Resp: 17 20 20 14   Temp:    97.6 F (36.4 C)  TempSrc:    Oral  SpO2: 96% 95% 97% 95%  Weight:      Height:       CBC:  Recent Labs  Lab 07/31/20 1112  WBC 5.6  NEUTROABS 3.6  HGB 14.9  HCT 46.3  MCV 91.7  PLT 585   Basic Metabolic Panel:  Recent Labs  Lab 07/31/20 1112  NA 139  K 4.0  CL 108  CO2 20*  GLUCOSE 124*  BUN 15  CREATININE 1.35*  CALCIUM 8.9  Lipid Panel:  Recent Labs  Lab 08/01/20 0952  CHOL 123  TRIG 86  HDL 34*  CHOLHDL 3.6  VLDL 17  LDLCALC 72   HgbA1c:  Recent Labs  Lab 08/01/20 0952  HGBA1C 5.8*   Urine Drug Screen:  Recent Labs  Lab 08/01/20 0859  LABOPIA NONE DETECTED  COCAINSCRNUR NONE DETECTED  LABBENZ NONE DETECTED  AMPHETMU NONE DETECTED  THCU NONE DETECTED  LABBARB NONE DETECTED    Alcohol Level  Recent Labs  Lab 07/31/20 1112  ETH <10   IMAGING past 24 hours MR BRAIN WO CONTRAST  Result Date: 07/31/2020 CLINICAL DATA:  Follow-up examination for acute stroke. EXAM: MRI HEAD WITHOUT CONTRAST TECHNIQUE: Multiplanar, multiecho pulse sequences of the brain and surrounding structures were obtained without intravenous contrast. COMPARISON:  Prior head CT from earlier the same day as well as previous MRI from 04/12/2018. FINDINGS: Brain: Generalized age-related  cerebral atrophy. Patchy T2/FLAIR hyperintensity within the periventricular and deep white matter both cerebral hemispheres most consistent with chronic small vessel ischemic disease, stable. Previously identified acute hemorrhage centered at the left thalamus again seen, overall size and morphology is not significantly changed with the hemorrhage measuring 1.6 x 2.3 x 2.7 cm (estimated volume 5 cc). Mild surrounding vasogenic edema without significant regional mass effect. Associated intraventricular extension with blood seen layering within the occipital horns of both lateral ventricles. Stable ventricular size and morphology without hydrocephalus. Additional scattered siderosis noted about the cerebellum, likely related to redistribution. No other acute intracranial hemorrhage. Few additional chronic micro hemorrhages noted at the left cerebellar vermis (series 11, image 16) and subcortical right parieto-occipital region (series 11, image 35), suspected to be hypertensive in nature. No other foci of restricted diffusion to suggest acute or subacute ischemia. No other areas of encephalomalacia to suggest chronic cortical infarction. No mass lesion or significant midline shift. No extra-axial fluid collection. No made of a partially empty sella. Midline structures intact. Vascular: Major intracranial vascular flow voids are well maintained and normal. Skull and upper cervical spine: Craniocervical junction within normal limits. Upper cervical spine normal. Bone marrow signal intensity within normal limits. No scalp soft tissue abnormality. Sinuses/Orbits: Patient status post bilateral ocular lens replacement. Globes and orbital soft tissues demonstrate no acute finding. Paranasal  sinuses are largely clear. No significant mastoid effusion. Inner ear structures grossly normal. Other: None. IMPRESSION: 1. No significant interval change in size and morphology of acute hemorrhage centered at the left thalamus. Mild  surrounding vasogenic edema without significant regional mass effect. Associated intraventricular extension without hydrocephalus. 2. No other acute intracranial abnormality. 3. Age-related cerebral atrophy with mild chronic small vessel ischemic disease. Electronically Signed   By: Jeannine Boga M.D.   On: 07/31/2020 22:57     Physical Exam  Constitutional: Appears well-developed and well-nourished.  Psych: Affect appropriate to situation Eyes: No scleral injection HENT: No OP obstrucion Head: Normocephalic.  Cardiovascular: Normal rate and regular rhythm.  Respiratory: Effort normal and breath sounds normal to anterior ascultation GI: Soft.  No distension. There is no tenderness.  Skin: WDI  Neuro: Mental Status: Patient is awake, alert, oriented to person, place, month, year, and situation. Patient is able to give a clear and coherent history. No signs of neglect. He has an increased latency of response.  Cranial Nerves: II: Visual Fields are full. Pupils are equal, round, and reactive to light.   III,IV, VI: EOMI without ptosis or diplopia.  V: Facial sensation is symmetric to temperature VII: Facial movement is symmetric.  VIII: hearing is intact to voice X: Uvula is midline and palate elevates symmetrically XI: Shoulder shrug is symmetric. XII: tongue is midline without atrophy or fasciculations.  Motor: Tone is normal. Bulk is normal. 5/5 strength was present in all four extremities.  Diminished fine finger movements on the right.  Orbits left to right upper extremity.  Mild right grip weakness. Sensory: Sensation is symmetric to light touch and temperature in the arms and legs. Cerebellar: FNF intact bilaterally  ASSESSMENT/PLAN Kenneth Harper is a 80 y.o. male with history of memory loss, hearing loss, CKD stage 2, HLD, gout, obesity and hypercholesterolemia who presents with frontal headache for several hours, vague feeling of not doing well, s/p  unwitnessed fall on the day of admission with denied LOC. He presented to [facility] ED for evaluation at the insistence of his wife. NIHSS 0.   Large thalamic hemorrhage with associated vasogenic edema without mass effect likely related to uncontrolled previously unknown hypertension.   HOLD all antiplatelets or anticoagulants   Repeat HCT stat for decreased mental status or neurologic worsening   Code Stroke shows thalamic hemorrhage    MRI: No significant interval change in size and morphology of acutehemorrhage centered at the left thalamus. Mild surrounding vasogenicedema without significant regional mass effect. Associatedintraventricular extension without hydrocephalus. Age-related cerebral atrophy with mild chronic small vessel ischemic disease.  Carotid Doppler  PENDING  A1C PENDING  Lower extremity venous dopplers  LDL No results found for requested labs within last 26280 hours.  HgbA1c No results found for requested labs within last 26280 hours.  VTE prophylaxis -SCDs    Diet   Diet regular Room service appropriate? Yes; Fluid consistency: Thin    Therapy recommendations:  TBD  Disposition:  TBD  Hypertension, previously unknown  Currently on Nicardipine drip  Norvasc 5mg  daily to start today   Blood pressure control with goal systolic 850-277 . Long-term BP goal normotensive  Hyperlipidemia  Home meds: Lipitor 40 mg, resumed in hospital  LDL pending, goal < 70  Continue statin at discharge  Other Stroke Risk Factors  Advanced Age >/= 45   History of smoking: reports that he quit smoking about 9 years ago. His smoking use included cigars. He quit after 42.00 years  of use. He has never used smokeless tobacco.   He reports that he does not drink alcohol and does not use drugs.   Obesity, Body mass index is 32.08 kg/m., BMI >/= 30 associated with increased stroke risk, recommend weight loss, diet and exercise as appropriate    Other Active  Problems    I have personally obtained history,examined this patient, reviewed notes, independently viewed imaging studies, participated in medical decision making and plan of care.ROS completed by me personally and pertinent positives fully documented  I have made any additions or clarifications directly to the above note. Agree with note above.  Continue close neurological monitoring and strict blood pressure control with systolic less than [id number] for the first 24 hours and then below 160.  Start Norvasc 5 mg daily and wean Cleviprex drip as tolerated.  May use as needed IV labetalol 20 mg every 2 hourly as needed oral hydralazine 20 mg every 6 hourly as needed as needed.  Mobilize out of bed.  Therapy consults. This patient is critically ill and at significant risk of neurological worsening, death and care requires constant monitoring of vital signs, hemodynamics,respiratory and cardiac monitoring, extensive review of multiple databases, frequent neurological assessment, discussion with family, other specialists and medical decision making of high complexity.I have made any additions or clarifications directly to the above note.This critical care time does not reflect procedure time, or teaching time or supervisory time of PA/NP/Med Resident etc but could involve care discussion time.  I spent 30 minutes of neurocritical care time  in the care of  this patient.     Antony Contras, MD Medical Director [place] Pager: [phone] 08/01/2020 6:05 PM  Hospital day # 1  To contact Stroke Continuity provider, please refer to [email or web address] After hours, contact General Neurology

## 2020-08-01 NOTE — Plan of Care (Signed)
  Problem: Pain Managment: Goal: General experience of comfort will improve Outcome: Progressing   

## 2020-08-01 NOTE — Evaluation (Signed)
Physical Therapy Evaluation Patient Details Name: Kenneth Harper MRN: [id number] DOB: [birth year] Today's Date: 08/01/2020   History of Present Illness  80 yo male with onset of fall and memory changes for the events of the fall was brought to ED, found to have L thalamic acute hemorrhage of 5 cc volume, microhemorrhages L cerebellar vermis and subcortical R parieto-occipital areas, 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  New HTN dx, has HLD.  PMHx:  OA knees, asthma, CTS, cataracts, diverticulitis, GERD, memory loss  Clinical Impression  Pt was seen for initial mobility and note his history lapses and balance changes.  Has been working and has tolerated mobility well, but per his daughter he is forgetting falling and forgetting his recent history of being outside down for several hours when he fell.  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  Pt is unsafe to walk alone currently, but with rehab should be able to get back to living independently.  Focus on balance, RLE strength and control of his steps in sudden turns and unexpected stops.  See for goals of acute PT.    Follow Up Recommendations CIR    Equipment Recommendations  None recommended by PT    Recommendations for Other Services Rehab consult     Precautions / Restrictions Precautions Precautions: Fall Precaution Comments: monitor vitals Restrictions Weight Bearing Restrictions: No      Mobility  Bed Mobility               General bed mobility comments: up when PT arrived    Transfers Overall transfer level: Needs assistance Equipment used: 1 person hand held assist Transfers: Sit to/from Stand Sit to Stand: Min guard         General transfer comment: min guard for safety  Ambulation/Gait Ambulation/Gait assistance: Min assist Gait Distance (Feet): 60 Feet Assistive device: 1 person hand held  assist;IV Pole Gait Pattern/deviations: Step-through pattern;Wide base of support;Decreased stride length Gait velocity: reduced   General Gait Details: pt takes extra time and makes wide turns on IV pole to change directions, mild unsteadiness with tedency to make unsafe choices with movement such as backing up to the chair from several feet away  Stairs            Wheelchair Mobility    Modified Rankin (Stroke Patients Only)       Balance Overall balance assessment: Needs assistance Sitting-balance support: Feet supported Sitting balance-Leahy Scale: Good     Standing balance support: Single extremity supported Standing balance-Leahy Scale: Fair Standing balance comment: less than fair dynamically                             Pertinent Vitals/Pain Pain Assessment: No/denies pain    Home Living Family/patient expects to be discharged to:: Private residence Living Arrangements: Spouse/significant other Available Help at Discharge: Family;Available PRN/intermittently;Available 24 hours/day Type of Home: House Home Access: Level entry     Home Layout: One level Home Equipment: Cane - single point      Prior Function Level of Independence: Independent with assistive device(s)         Comments: per pt was working before this hosp     Hand Dominance   Dominant Hand: Right    Extremity/Trunk Assessment   Upper Extremity Assessment Upper Extremity Assessment: Overall WFL for tasks assessed    Lower  Extremity Assessment Lower Extremity Assessment: RLE deficits/detail RLE Deficits / Details: R hip 4+ strength, knee and ankle 4+    Cervical / Trunk Assessment Cervical / Trunk Assessment: Kyphotic (mild)  Communication   Communication: No difficulties  Cognition Arousal/Alertness: Awake/alert Behavior During Therapy: Flat affect Overall Cognitive Status: Impaired/Different from baseline Area of Impairment:  Memory;Safety/judgement;Awareness;Problem solving                     Memory: Decreased short-term memory   Safety/Judgement: Decreased awareness of safety;Decreased awareness of deficits Awareness: Intellectual Problem Solving: Slow processing;Requires verbal cues;Requires tactile cues General Comments: pt is unsteady on his feet and requires help to turn and use narrow base of support, but is not fully aware of these issues      General Comments General comments (skin integrity, edema, etc.): Pt requires min guard if standing with feet together and mod assist if standing in tandem    Exercises     Assessment/Plan    PT Assessment Patient needs continued PT services  PT Problem List Decreased strength;Decreased range of motion;Decreased activity tolerance;Decreased balance;Decreased mobility;Decreased coordination;Decreased knowledge of use of DME;Decreased safety awareness       PT Treatment Interventions DME instruction;Gait training;Stair training;Functional mobility training;Therapeutic activities;Therapeutic exercise;Balance training;Neuromuscular re-education;Patient/family education    PT Goals (Current goals can be found in the Care Plan section)  Acute Rehab PT Goals Patient Stated Goal: to get stronger PT Goal Formulation: With patient Time For Goal Achievement: 08/15/20 Potential to Achieve Goals: Good    Frequency Min 4X/week   Barriers to discharge Decreased caregiver support home with wife who cannot assist him    Co-evaluation               AM-PAC PT "6 Clicks" Mobility  Outcome Measure Help needed turning from your back to your side while in a flat bed without using bedrails?: None Help needed moving from lying on your back to sitting on the side of a flat bed without using bedrails?: A Little Help needed moving to and from a bed to a chair (including a wheelchair)?: A Little Help needed standing up from a chair using your arms (e.g.,  wheelchair or bedside chair)?: A Little Help needed to walk in hospital room?: A Little Help needed climbing 3-5 steps with a railing? : A Lot 6 Click Score: 18    End of Session Equipment Utilized During Treatment: Gait belt Activity Tolerance: Patient limited by fatigue;Treatment limited secondary to medical complications (Comment) Patient left: in chair;with call bell/phone within reach;with family/visitor present Nurse Communication: Mobility status PT Visit Diagnosis: Unsteadiness on feet (R26.81);Muscle weakness (generalized) (M62.81);History of falling (Z91.81);Hemiplegia and hemiparesis Hemiplegia - Right/Left: Right Hemiplegia - dominant/non-dominant: Dominant    Time: 5329-9242 PT Time Calculation (min) (ACUTE ONLY): 39 min   Charges:   PT Evaluation $PT Eval Moderate Complexity: 1 Mod PT Treatments $Gait Training: 8-22 mins $Therapeutic Exercise: 8-22 mins       Ramond Dial 08/01/2020, 2:03 PM  Mee Hives, PT MS Acute Rehab Dept. Number: [place] and [place]

## 2020-08-01 NOTE — Progress Notes (Signed)
Rehab Admissions Coordinator Note:  Patient was screened by Cleatrice Burke for appropriateness for an Inpatient Acute Rehab Consult per therapy recommendations.   At this time, we are recommending Inpatient Rehab consult. I will place order per protocol.  Cleatrice Burke RN MSN 08/01/2020, 3:00 PM  I can be reached at [phone].

## 2020-08-01 NOTE — ED Notes (Signed)
Breakfast ordered 

## 2020-08-02 ENCOUNTER — Inpatient Hospital Stay (HOSPITAL_COMMUNITY): Payer: Medicare HMO

## 2020-08-02 DIAGNOSIS — R413 Other amnesia: Secondary | ICD-10-CM | POA: Diagnosis not present

## 2020-08-02 DIAGNOSIS — R030 Elevated blood-pressure reading, without diagnosis of hypertension: Secondary | ICD-10-CM | POA: Diagnosis not present

## 2020-08-02 DIAGNOSIS — E785 Hyperlipidemia, unspecified: Secondary | ICD-10-CM

## 2020-08-02 DIAGNOSIS — I6389 Other cerebral infarction: Secondary | ICD-10-CM

## 2020-08-02 DIAGNOSIS — I619 Nontraumatic intracerebral hemorrhage, unspecified: Secondary | ICD-10-CM | POA: Diagnosis not present

## 2020-08-02 DIAGNOSIS — M1711 Unilateral primary osteoarthritis, right knee: Secondary | ICD-10-CM

## 2020-08-02 DIAGNOSIS — N179 Acute kidney failure, unspecified: Secondary | ICD-10-CM

## 2020-08-02 LAB — CBC WITH DIFFERENTIAL/PLATELET
Abs Immature Granulocytes: 0.02 10*3/uL (ref 0.00–0.07)
Basophils Absolute: 0 10*3/uL (ref 0.0–0.1)
Basophils Relative: 0 %
Eosinophils Absolute: 0.1 10*3/uL (ref 0.0–0.5)
Eosinophils Relative: 2 %
HCT: 44.4 % (ref 39.0–52.0)
Hemoglobin: 14.6 g/dL (ref 13.0–17.0)
Immature Granulocytes: 0 %
Lymphocytes Relative: 18 %
Lymphs Abs: 1.3 10*3/uL (ref 0.7–4.0)
MCH: 29.6 pg (ref 26.0–34.0)
MCHC: 32.9 g/dL (ref 30.0–36.0)
MCV: 90.1 fL (ref 80.0–100.0)
Monocytes Absolute: 1.1 10*3/uL — ABNORMAL HIGH (ref 0.1–1.0)
Monocytes Relative: 14 %
Neutro Abs: 4.9 10*3/uL (ref 1.7–7.7)
Neutrophils Relative %: 66 %
Platelets: 188 10*3/uL (ref 150–400)
RBC: 4.93 MIL/uL (ref 4.22–5.81)
RDW: 13.4 % (ref 11.5–15.5)
WBC: 7.4 10*3/uL (ref 4.0–10.5)
nRBC: 0 % (ref 0.0–0.2)

## 2020-08-02 LAB — BASIC METABOLIC PANEL
Anion gap: 8 (ref 5–15)
BUN: 16 mg/dL (ref 8–23)
CO2: 25 mmol/L (ref 22–32)
Calcium: 8.9 mg/dL (ref 8.9–10.3)
Chloride: 105 mmol/L (ref 98–111)
Creatinine, Ser: 1.38 mg/dL — ABNORMAL HIGH (ref 0.61–1.24)
GFR, Estimated: 52 mL/min — ABNORMAL LOW (ref >60)
Glucose, Bld: 129 mg/dL — ABNORMAL HIGH (ref 70–99)
Potassium: 4.3 mmol/L (ref 3.5–5.1)
Sodium: 138 mmol/L (ref 135–145)

## 2020-08-02 LAB — ECHOCARDIOGRAM COMPLETE
Area-P 1/2: 2.56 cm2
Height: 71 in
S' Lateral: 2.7 cm
Weight: 3680 oz

## 2020-08-02 MED ORDER — LABETALOL HCL 5 MG/ML IV SOLN: 10.0000 mg | INTRAVENOUS | Status: DC | PRN

## 2020-08-02 MED ORDER — ACETAMINOPHEN-CODEINE #3 300-30 MG PO TABS: 2.0000 | ORAL_TABLET | Freq: Four times a day (QID) | ORAL | Status: DC | PRN

## 2020-08-02 MED ORDER — LABETALOL HCL 5 MG/ML IV SOLN
10.0000 mg | INTRAVENOUS | Status: DC | PRN
  Administered 2020-08-02: 10 mg via INTRAVENOUS

## 2020-08-02 MED ORDER — PANTOPRAZOLE SODIUM 40 MG PO TBEC
40.0000 mg | DELAYED_RELEASE_TABLET | Freq: Every day | ORAL | Status: DC
  Administered 2020-08-02 – 2020-08-05 (×4): 40 mg via ORAL
  Filled 2020-08-02 (×4): qty 1

## 2020-08-02 NOTE — Evaluation (Signed)
Occupational Therapy Evaluation Patient Details Name: Kenneth Harper MRN: [id number] DOB: May 30, 1941 Today's Date: 08/02/2020    History of Present Illness 80 yo male with onset of fall and memory changes for the events of the fall was brought to ED, found to have L thalamic acute hemorrhage of 5 cc volume, microhemorrhages L cerebellar vermis and subcortical R parieto-occipital areas, 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  New HTN dx, has HLD.  PMHx:  OA knees, asthma, CTS, cataracts, diverticulitis, GERD, memory loss   Clinical Impression   PT admitted with R knee pain and L thalamic hemorrhage. Pt currently with functional limitiations due to the deficits listed below (see OT problem list). Pt currently mod (A) without DME and normally walks without DME. Pt reports that he is uncertain about d/c home at this time. Son states family can arrange care upon d/c. Pt will benefit from skilled OT to increase their independence and safety with adls and balance to allow discharge CIR or 24/7 (A) home with mod (A). Family to discuss and decide on plan. Pt did not realize he was refusing CIR care when answering earlier.      Follow Up Recommendations  CIR    Equipment Recommendations  Other (comment) (RW)    Recommendations for Other Services Rehab consult     Precautions / Restrictions Precautions Precautions: Fall Precaution Comments: monitor vitals Restrictions Weight Bearing Restrictions: No      Mobility Bed Mobility Overal bed mobility: Needs Assistance             General bed mobility comments: up with OT upon arrival to room, requests stay in recliner    Transfers Overall transfer level: Needs assistance Equipment used: Rolling walker (2 wheeled);1 person hand held assist Transfers: Sit to/from Stand Sit to Stand: Min assist;Min guard         General transfer comment: Min assist when standing without AD for power up and steadying, min guard with  use of RW.    Balance Overall balance assessment: Needs assistance Sitting-balance support: Feet supported Sitting balance-Leahy Scale: Good     Standing balance support: Bilateral upper extremity supported Standing balance-Leahy Scale: Poor Standing balance comment: reliant on external assist                           ADL either performed or assessed with clinical judgement   ADL Overall ADL's : Needs assistance/impaired     Grooming: Wash/dry hands;Min guard;Standing               Lower Body Dressing: Maximal assistance;Sit to/from stand   Toilet Transfer: Moderate assistance;Regular Toilet;Grab bars           Functional mobility during ADLs: Moderate assistance General ADL Comments: pt reaching for all environmental supports and not using RW. pt unable to transfer when a gap in room does not allow for hand holds on wall . pt reaching for therapist at this piont. pt states "i guess i do need that"     Vision Baseline Vision/History: Wears glasses Wears Glasses: Reading only       Perception     Praxis      Pertinent Vitals/Pain Pain Assessment: Faces Faces Pain Scale: Hurts little more Pain Location: R knee, frontal headache Pain Descriptors / Indicators: Discomfort;Grimacing;Sore Pain Intervention(s): Limited activity within patient's tolerance;Monitored during session;Repositioned     Hand Dominance Right   Extremity/Trunk Assessment Upper Extremity Assessment Upper Extremity Assessment: Overall [facility]  for tasks assessed   Lower Extremity Assessment Lower Extremity Assessment: Defer to PT evaluation   Cervical / Trunk Assessment Cervical / Trunk Assessment: Kyphotic   Communication Communication Communication: No difficulties   Cognition Arousal/Alertness: Awake/alert Behavior During Therapy: WFL for tasks assessed/performed Overall Cognitive Status: Impaired/Different from baseline Area of Impairment: Safety/judgement;Problem  solving;Awareness                     Memory: Decreased short-term memory   Safety/Judgement: Decreased awareness of safety;Decreased awareness of deficits Awareness: Emergent Problem Solving: Difficulty sequencing;Requires verbal cues;Requires tactile cues General Comments: Pt with decreased R-sided awareness, bumping into objects on R multiple times. Pt lacks insight into how present deficits correlate with function at home, but ultimately decides that he does not feel safe with d/c home if he were to go home today   General Comments  VSS    Exercises     Shoulder Instructions      Home Living Family/patient expects to be discharged to:: Private residence Living Arrangements: Spouse/significant other Available Help at Discharge: Family;Available PRN/intermittently;Available 24 hours/day Type of Home: House Home Access: Level entry     Home Layout: One level         Bathroom Toilet: Standard     Home Equipment: Kasandra Knudsen - single point   Additional Comments: 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 wife does not require physical (A) but is unable to manage more than supervision care. Son      Prior Functioning/Environment Level of Independence: Independent                 OT Problem List: Decreased activity tolerance;Impaired balance (sitting and/or standing);Decreased cognition;Decreased safety awareness;Decreased knowledge of use of DME or AE;Obesity;Pain      OT Treatment/Interventions: Self-care/ADL training;Therapeutic exercise;Neuromuscular education;Energy conservation;DME and/or AE instruction;Manual therapy;Modalities;Therapeutic activities;Patient/family education;Balance training;Cognitive remediation/compensation    OT Goals(Current goals can be found in the care plan section) Acute Rehab OT Goals Patient Stated Goal: to get stronger OT Goal Formulation: With patient/family Time For Goal Achievement:  08/16/20 Potential to Achieve Goals: Good  OT Frequency: Min 2X/week   Barriers to D/C:            Co-evaluation              AM-PAC OT "6 Clicks" Daily Activity     Outcome Measure Help from another person eating meals?: None Help from another person taking care of personal grooming?: A Little Help from another person toileting, which includes using toliet, bedpan, or urinal?: A Little Help from another person bathing (including washing, rinsing, drying)?: A Little Help from another person to put on and taking off regular upper body clothing?: A Little Help from another person to put on and taking off regular lower body clothing?: A Lot 6 Click Score: 18   End of Session Equipment Utilized During Treatment: Rolling walker Nurse Communication: Mobility status;Precautions  Activity Tolerance: Patient limited by pain (guarding R knee) Patient left: Other (comment) (EOB for PT session)  OT Visit Diagnosis: Unsteadiness on feet (R26.81);Pain Pain - Right/Left: Right Pain - part of body: Knee                Time: 1505-1530 OT Time Calculation (min): 25 min Charges:  OT General Charges $OT Visit: 1 Visit OT Evaluation $OT Eval Moderate Complexity: 1 Mod   Brynn, OTR/L  Acute Rehabilitation Services Pager: [phone] Office: [phone] .   Jeri Modena 08/02/2020, 5:37  PM

## 2020-08-02 NOTE — Evaluation (Signed)
Speech Language Pathology Evaluation Patient Details Name: AKON REINOSO MRN: [id number] DOB: September 11, 1940 Today's Date: 08/02/2020 Time: 5956-3875 SLP Time Calculation (min) (ACUTE ONLY): 25 min  Problem List:  Patient Active Problem List   Diagnosis Date Noted   ICH (intracerebral hemorrhage) ([place]) 07/31/2020   Bilateral primary osteoarthritis of knee 08/10/2019   Carpal tunnel syndrome on both sides 07/28/2018   Lumbar herniated disc 10/01/2015   Herniation of lumbar intervertebral disc with radiculopathy 09/30/2015    Class: Acute   Past Medical History:  Past Medical History:  Diagnosis Date   Arthritis    knees and back   Asthma    childhood   Carpal tunnel syndrome    Cataract    Diverticulitis    GERD (gastroesophageal reflux disease)    Hypercholesterolemia    Memory loss    Past Surgical History:  Past Surgical History:  Procedure Laterality Date   CATARACT EXTRACTION, BILATERAL Bilateral    COLONOSCOPY WITH PROPOFOL N/A 10/29/2014   Procedure: COLONOSCOPY WITH PROPOFOL;  Surgeon: Garlan Fair, MD;  Location: [facility];  Service: Endoscopy;  Laterality: N/A;   EYE SURGERY     LUMBAR LAMINECTOMY/DECOMPRESSION MICRODISCECTOMY N/A 10/01/2015   Procedure: LEFT L2-3 MICRODISCECTOMY;  Surgeon: Jessy Oto, MD;  Location: [facility];  Service: Orthopedics;  Laterality: N/A;   HPI:  80 yo male with onset of fall and memory changes for the events of the fall was brought to ED, found to have L thalamic acute hemorrhage of 5 cc volume, microhemorrhages L cerebellar vermis and subcortical R parieto-occipital areas, 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  New HTN dx, has HLD.  PMHx:  OA knees, asthma, CTS, cataracts, diverticulitis, GERD, memory loss   Assessment / Plan / Recommendation Clinical Impression  Pt demonstrates cognitive impairments including decreased working memory to follow comlpex directions, poor ability to reason and  problem solve complex verbal tasks. Pt was attentive throughout session, alternating between tasks well. But when pressured to apply effort to reasoning through a problem, pts responses were typically superficial and incomplete. Son was present for eval and did not offer additional explanation for pts cognition, such as baseline impairment. Pt will likely need assist with higher level cognitive tasks after d/c such as financial decision making, medication management, appointments etc. Recommend CIR at d/c.    SLP Assessment  SLP Recommendation/Assessment: Patient needs continued Speech Lanaguage Pathology Services SLP Visit Diagnosis: Frontal lobe and executive function deficit Frontal lobe and executive function deficit following: Nontraumatic intracerebral hemorrhage    Follow Up Recommendations  Inpatient Rehab    Frequency and Duration min 2x/week  2 weeks      SLP Evaluation Cognition  Overall Cognitive Status: Impaired/Different from baseline Arousal/Alertness: Awake/alert Orientation Level: Oriented to person;Oriented to place;Oriented to time;Oriented to situation Attention: Focused;Sustained;Alternating Focused Attention: Appears intact Sustained Attention: Appears intact Alternating Attention: Appears intact Memory: Impaired Memory Impairment: Storage deficit;Retrieval deficit Awareness: Impaired Awareness Impairment: Anticipatory impairment Problem Solving: Impaired Problem Solving Impairment: Verbal complex;Functional complex Executive Function: Reasoning;Sequencing Safety/Judgment: Appears intact       Comprehension  Auditory Comprehension Overall Auditory Comprehension: Impaired Yes/No Questions: Within Functional Limits Commands: Impaired Two Step Basic Commands: 75-100% accurate Multistep Basic Commands: 50-74% accurate Complex Commands: 50-74% accurate Conversation: Complex Reading Comprehension Reading Status: Not tested    Expression Expression Primary  Mode of Expression: Verbal Verbal Expression Overall Verbal Expression: Appears within functional limits for tasks assessed   Oral / Motor  Oral Motor/Sensory Function Overall Oral  Motor/Sensory Function: Within functional limits Motor Speech Overall Motor Speech: Appears within functional limits for tasks assessed   GO                    Maleko Greulich, Katherene Ponto 08/02/2020, 11:59 AM

## 2020-08-02 NOTE — Progress Notes (Signed)
STROKE TEAM PROGRESS NOTE   INTERVAL HISTORY No acute events in 24 hours  Son is at bedside during visit. Blood pressure adequately controlled. He reports feeling well today.  Patient complains of pain in his right knee which has some swelling and redness. Vitals:   08/02/20 1015 08/02/20 1016 08/02/20 1100 08/02/20 1200  BP: (!) 109/56 (!) 109/56 120/72 121/64  Pulse: 82  84 81  Resp: 19  20 18   Temp:      TempSrc:      SpO2: 99%  97% 98%  Weight:      Height:       CBC:  Recent Labs  Lab 07/31/20 1112  WBC 5.6  NEUTROABS 3.6  HGB 14.9  HCT 46.3  MCV 91.7  PLT 130   Basic Metabolic Panel:  Recent Labs  Lab 07/31/20 1112  NA 139  K 4.0  CL 108  CO2 20*  GLUCOSE 124*  BUN 15  CREATININE 1.35*  CALCIUM 8.9  Lipid Panel:  Recent Labs  Lab 08/01/20 0952  CHOL 123  TRIG 86  HDL 34*  CHOLHDL 3.6  VLDL 17  LDLCALC 72   HgbA1c:  Recent Labs  Lab 08/01/20 0952  HGBA1C 5.8*   Urine Drug Screen:  Recent Labs  Lab 08/01/20 0859  LABOPIA NONE DETECTED  COCAINSCRNUR NONE DETECTED  LABBENZ NONE DETECTED  AMPHETMU NONE DETECTED  THCU NONE DETECTED  LABBARB NONE DETECTED    Alcohol Level  Recent Labs  Lab 07/31/20 1112  ETH <10   IMAGING past 24 hours ECHOCARDIOGRAM COMPLETE  Result Date: 08/02/2020    ECHOCARDIOGRAM REPORT   Patient Name:   Kenneth Harper [facility] Date of Exam: 08/02/2020 Medical Rec #:  [id number]       Height:       71.0 in Accession #:    [id number]      Weight:       230.0 lb Date of Birth:  07-18-1940       BSA:          2.238 m Patient Age:    80 years        BP:           109/63 mmHg Patient Gender: M               HR:           92 bpm. Exam Location:  Inpatient Procedure: 2D Echo, Cardiac Doppler and Color Doppler Indications:    Stroke 434.91 / I163.9  History:        Patient has no prior history of Echocardiogram examinations.                 Risk Factors:Dyslipidemia and GERD.  Sonographer:    Jonelle Sidle Dance Referring Phys: Merlín.Osler MCNEILL  P [place]  1. Left ventricular ejection fraction, by estimation, is 55 to 60%. The left ventricle has normal function. The left ventricle has no regional wall motion abnormalities. Left ventricular diastolic parameters are consistent with Grade I diastolic dysfunction (impaired relaxation). Elevated left ventricular end-diastolic pressure.  2. Right ventricular systolic function is normal. The right ventricular size is normal. There is normal pulmonary artery systolic pressure.  3. The mitral valve is normal in structure. No evidence of mitral valve regurgitation. No evidence of mitral stenosis.  4. The aortic valve is normal in structure. There is mild calcification of the aortic valve. There is mild thickening of the aortic valve. Aortic valve regurgitation is not  visualized. No aortic stenosis is present.  5. Aortic dilatation noted. There is mild dilatation of the ascending aorta, measuring 37 mm.  6. The inferior vena cava is dilated in size with >50% respiratory variability, suggesting right atrial pressure of 8 mmHg. FINDINGS  Left Ventricle: Left ventricular ejection fraction, by estimation, is 55 to 60%. The left ventricle has normal function. The left ventricle has no regional wall motion abnormalities. The left ventricular internal cavity size was normal in size. There is  no left ventricular hypertrophy. Left ventricular diastolic parameters are consistent with Grade I diastolic dysfunction (impaired relaxation). Elevated left ventricular end-diastolic pressure. Right Ventricle: The right ventricular size is normal. No increase in right ventricular wall thickness. Right ventricular systolic function is normal. There is normal pulmonary artery systolic pressure. The tricuspid regurgitant velocity is 1.50 m/s, and  with an assumed right atrial pressure of 8 mmHg, the estimated right ventricular systolic pressure is 82.4 mmHg. Left Atrium: Left atrial size was normal in size. Right Atrium:  Right atrial size was normal in size. Pericardium: There is no evidence of pericardial effusion. Mitral Valve: The mitral valve is normal in structure. No evidence of mitral valve regurgitation. No evidence of mitral valve stenosis. Tricuspid Valve: The tricuspid valve is normal in structure. Tricuspid valve regurgitation is trivial. No evidence of tricuspid stenosis. Aortic Valve: The aortic valve is normal in structure. There is mild calcification of the aortic valve. There is mild thickening of the aortic valve. Aortic valve regurgitation is not visualized. No aortic stenosis is present. Pulmonic Valve: The pulmonic valve was normal in structure. Pulmonic valve regurgitation is not visualized. No evidence of pulmonic stenosis. Aorta: Aortic dilatation noted. There is mild dilatation of the ascending aorta, measuring 37 mm. Venous: The inferior vena cava is dilated in size with greater than 50% respiratory variability, suggesting right atrial pressure of 8 mmHg. IAS/Shunts: No atrial level shunt detected by color flow Doppler.  LEFT VENTRICLE PLAX 2D LVIDd:         3.70 cm  Diastology LVIDs:         2.70 cm  LV e' medial:    5.66 cm/s LV PW:         1.30 cm  LV E/e' medial:  13.9 LV IVS:        1.15 cm  LV e' lateral:   7.29 cm/s LVOT diam:     2.00 cm  LV E/e' lateral: 10.8 LV SV:         64 LV SV Index:   29 LVOT Area:     3.14 cm  RIGHT VENTRICLE             IVC RV Basal diam:  2.30 cm     IVC diam: 2.30 cm RV S prime:     16.20 cm/s TAPSE (M-mode): 2.5 cm LEFT ATRIUM             Index       RIGHT ATRIUM           Index LA diam:        3.50 cm 1.56 cm/m  RA Area:     12.20 cm LA Vol (A2C):   61.6 ml 27.53 ml/m RA Volume:   24.90 ml  11.13 ml/m LA Vol (A4C):   36.0 ml 16.09 ml/m LA Biplane Vol: 49.1 ml 21.94 ml/m  AORTIC VALVE LVOT Vmax:   98.90 cm/s LVOT Vmean:  65.600 cm/s LVOT VTI:    0.205 m  AORTA Ao Root diam: 3.30 cm Ao Asc diam:  3.70 cm MITRAL VALVE                TRICUSPID VALVE MV Area (PHT):  2.56 cm     TR Peak grad:   9.0 mmHg MV Decel Time: 296 msec     TR Vmax:        150.00 cm/s MV E velocity: 78.50 cm/s MV A velocity: 111.00 cm/s  SHUNTS MV E/A ratio:  0.71         Systemic VTI:  0.20 m                             Systemic Diam: 2.00 cm Skeet Latch MD Electronically signed by Skeet Latch MD Signature Date/Time: 08/02/2020/11:55:42 AM    Final    Physical Exam  Constitutional: Appears well-developed and well-nourished.  Psych: Affect appropriate to situation Eyes: No scleral injection HENT: No OP obstrucion Head: Normocephalic.  Cardiovascular: Normal rate and regular rhythm.  Respiratory: Effort normal and breath sounds normal to anterior ascultation GI: Soft.  No distension. There is no tenderness.  Skin: WDI Right knee swelling and pain with some redness on the lateral aspect. Neuro: Mental Status: Patient is awake, alert, oriented to person, place, month, year, and situation. Patient is able to give a clear and coherent history. No signs of neglect. He has an increased latency of response.  Cranial Nerves: II: Visual Fields are full. Pupils are equal, round, and reactive to light.   III,IV, VI: EOMI without ptosis or diplopia.  V: Facial sensation is symmetric to temperature VII: Facial movement is symmetric.  VIII: hearing is intact to voice X: Uvula is midline and palate elevates symmetrically XI: Shoulder shrug is symmetric. XII: tongue is midline without atrophy or fasciculations.  Motor: Tone is normal. Bulk is normal. 5/5 strength was present in all four extremities.  Diminished fine finger movements on the right.  Orbits left to right upper extremity.  Mild right grip weakness. Sensory: Sensation is symmetric to light touch and temperature in the arms and legs. Cerebellar: FNF intact bilaterally  ASSESSMENT/PLAN Kenneth Harper is a 80 y.o. male with history of memory loss, hearing loss, CKD stage 2, HLD, gout, obesity and  hypercholesterolemia who presents with frontal headache for several hours, vague feeling of not doing well, s/p unwitnessed fall on the day of admission with denied LOC. He presented to [facility] ED for evaluation at the insistence of his wife. NIHSS 0.   Large thalamic hemorrhage with associated vasogenic edema without mass effect likely related to uncontrolled previously unknown hypertension.   HOLD all antiplatelets or anticoagulants   Repeat HCT stat for decreased mental status or neurologic worsening   Code Stroke CT  shows thalamic hemorrhage    MRI: No significant interval change in size and morphology of acutehemorrhage centered at the left thalamus. Mild surrounding vasogenicedema without significant regional mass effect. Associatedintraventricular extension without hydrocephalus. Age-related cerebral atrophy with mild chronic small vessel ischemic disease.  Echo: Left ventricular ejection fraction, by estimation, is 55 to 60%.  Lab Results  Component Value Date   HGBA1C 5.8 (H) 08/01/2020   Lab Results  Component Value Date   LDLCALC 72 08/01/2020    VTE prophylaxis -SCDs    Diet   Diet regular Room service appropriate? Yes; Fluid consistency: Thin    Therapy recommendations:  CIR recommended  Disposition:  Unclear discharge plan,  Patient has declined CIR for now. Pending his making progress the plan is tentatively home with outpatient vs. home therapies. However, he must make progress for this to work out.    Hospitalist team, Dr. Sloan Leiter, contacted to assume primary management upon transfer to floor.   Hypertension, previously unknown  Currently on Nicardipine drip  Norvasc 5mg  daily to start today   Blood pressure control with goal systolic 0000000  Long-term BP goal normotensive  Hyperlipidemia  Home meds: Lipitor 40 mg on hold due to hemorrhage. May resume at discharge.   LDL pending, goal < 70  Other Stroke Risk Factors  Advanced Age >/= 34   History of  smoking: reports that he quit smoking about 9 years ago. His smoking use included cigars. He quit after 42.00 years of use. He has never used smokeless tobacco.   He reports that he does not drink alcohol and does not use drugs.  Obesity, Body mass index is 32.08 kg/m., BMI >/= 30 associated with increased stroke risk, recommend weight loss, diet and exercise as appropriate    Other Active Problems  Right knee pain and swelling.-Try local heat and ice pack.  Tylenol 3 every 6 hours as needed.  Mobilize out of bed.  Therapy consults.  Transfer to neurology floor bed.  Consult medical hospitalist team to assume care tomorrow help with management of his knee pain and swelling.  Greater than 50% time during this 35-minute visit was spent in counseling and coordination of care about his intracerebral hemorrhage and answering questions.  Discussed with patient and son and answered questions. Antony Contras, MD Medical Director [facility] St[facility] Pager: [phone] 08/02/2020 2:38 PM  Hospital day # 2  To contact Stroke Continuity provider, please refer to [email or web address] After hours, contact General Neurology

## 2020-08-02 NOTE — Progress Notes (Signed)
Inpatient Rehab Admissions:  Inpatient Rehab Consult received.  I met with patient at the bedside for rehabilitation assessment and to discuss goals and expectations of an inpatient rehab admission.  Pt would prefer to go home and receive ongoing therapy either as an outpatient or HH.  TOC made aware.  Will sign off on this pt.  Signed: Gayland Curry, [place], [place] Admissions Coordinator [phone]

## 2020-08-02 NOTE — Progress Notes (Signed)
°  Echocardiogram 2D Echocardiogram has been performed.  Kenneth Harper 08/02/2020, 8:27 AM

## 2020-08-02 NOTE — Consult Note (Signed)
Physical Medicine and Rehabilitation Consult Reason for Consult: Altered mental status Referring Physician: Dr. Leonel Ramsay  HPI: Kenneth Harper is a 80 y.o. right-handed male with history of memory loss, hyperlipidemia, quit smoking 9 years ago.  History taken from chart review and patient.  Son present.  Patient lives with spouse independent with assistive device.  1 level home.  Family assistance as needed.  He presented on 08/01/2020 with AMS and headaches after an unwitnessed fall.  Denies LOC.  CT/MRI showed acute 6 cm left thalamic hematoma.  Small volume intraventricular extension without hydrocephalus.  Admission chemistries unremarkable except glucose 1.4, creatinine 1.35, alcohol negative urine drug screen negative.  Echocardiogram with ejection fraction of 55-60%.  No wall motion abnormalities.  Neurology recommending conservative care for ICH.  Maintain on a regular diet.  Due to patient decreased functional ability due to cognitive changes, physical medicine rehab consult was requested.  Review of Systems  Constitutional: Negative for chills and fever.  HENT: Negative for hearing loss.   Eyes: Negative for blurred vision and double vision.  Respiratory: Negative for cough and shortness of breath.   Cardiovascular: Negative for chest pain, palpitations and leg swelling.  Gastrointestinal: Positive for constipation. Negative for heartburn, nausea and vomiting.  Genitourinary: Negative for dysuria, flank pain and hematuria.  Musculoskeletal: Positive for joint pain and myalgias.  Skin: Negative for rash.  Neurological: Positive for focal weakness and headaches.  Psychiatric/Behavioral: Positive for memory loss.  All other systems reviewed and are negative.  Past Medical History:  Diagnosis Date  . Arthritis    knees and back  . Asthma    childhood  . Carpal tunnel syndrome   . Cataract   . Diverticulitis   . GERD (gastroesophageal reflux disease)   .  Hypercholesterolemia   . Memory loss    Past Surgical History:  Procedure Laterality Date  . CATARACT EXTRACTION, BILATERAL Bilateral   . COLONOSCOPY WITH PROPOFOL N/A 10/29/2014   Procedure: COLONOSCOPY WITH PROPOFOL;  Surgeon: Garlan Fair, MD;  Location: [facility];  Service: Endoscopy;  Laterality: N/A;  . EYE SURGERY    . LUMBAR LAMINECTOMY/DECOMPRESSION MICRODISCECTOMY N/A 10/01/2015   Procedure: LEFT L2-3 MICRODISCECTOMY;  Surgeon: Jessy Oto, MD;  Location: [facility];  Service: Orthopedics;  Laterality: N/A;   Family History  Problem Relation Age of Onset  . Parkinson's disease Mother   . Heart failure Father   . Diabetes Brother   . Cancer Brother        throat  . Hydrocephalus Brother   . Diabetes Brother   . Migraines Sister    Social History:  reports that he quit smoking about 9 years ago. His smoking use included cigars. He quit after 42.00 years of use. He has never used smokeless tobacco. He reports that he does not drink alcohol and does not use drugs. Allergies: No Known Allergies Medications Prior to Admission  Medication Sig Dispense Refill  . atorvastatin (LIPITOR) 40 MG tablet Take 40 mg by mouth daily.   1    Home: Home Living Family/patient expects to be discharged to:: Private residence Living Arrangements: Spouse/significant other Available Help at Discharge: Family,Available PRN/intermittently,Available 24 hours/day Type of Home: House Home Access: Level entry Home Layout: One level [place] - single point  Functional History: Prior Function Level of Independence: Independent with assistive device(s) Comments: per pt was working before this hosp Functional Status:  Mobility: Bed Mobility General bed mobility comments:  up when PT arrived Transfers Overall transfer level: Needs assistance Equipment used: 1 person hand held assist Transfers: Sit to/from Stand Sit to Stand: Min guard General transfer  comment: min guard for safety Ambulation/Gait Ambulation/Gait assistance: Min assist Gait Distance (Feet): 60 Feet Assistive device: 1 person hand held assist,IV Pole Gait Pattern/deviations: Step-through pattern,Wide base of support,Decreased stride length General Gait Details: pt takes extra time and makes wide turns on IV pole to change directions, mild unsteadiness with tedency to make unsafe choices with movement such as backing up to the chair from several feet away Gait velocity: reduced    ADL:    Cognition: Cognition Overall Cognitive Status: Impaired/Different from baseline Orientation Level: Oriented X4 Cognition Arousal/Alertness: Awake/alert Behavior During Therapy: Flat affect Overall Cognitive Status: Impaired/Different from baseline Area of Impairment: Memory,Safety/judgement,Awareness,Problem solving Memory: Decreased short-term memory Safety/Judgement: Decreased awareness of safety,Decreased awareness of deficits Awareness: Intellectual Problem Solving: Slow processing,Requires verbal cues,Requires tactile cues General Comments: pt is unsteady on his feet and requires help to turn and use narrow base of support, but is not fully aware of these issues  Blood pressure (!) 149/92, pulse 100, temperature 97.8 F (36.6 C), temperature source Oral, resp. rate 18, height 5\' 11"  (1.803 m), weight 104.3 kg, SpO2 97 %. Physical Exam Vitals reviewed.  Constitutional:      General: He is not in acute distress.    Appearance: Normal appearance. He is obese. He is not ill-appearing.  HENT:     Head: Normocephalic and atraumatic.     Right Ear: External ear normal.     Left Ear: External ear normal.     Nose: Nose normal.  Eyes:     General:        Right eye: No discharge.        Left eye: No discharge.     Extraocular Movements: Extraocular movements intact.  Cardiovascular:     Rate and Rhythm: Normal rate and regular rhythm.  Pulmonary:     Effort: Pulmonary  effort is normal.     Breath sounds: Normal breath sounds.  Abdominal:     General: Abdomen is flat. Bowel sounds are normal. There is no distension.  Musculoskeletal:     Cervical back: Normal range of motion and neck supple.     Comments: Right knee with edema and tenderness  Skin:    General: Skin is dry.     Comments: Right knee with erythema  Neurological:     Mental Status: He is alert.     Comments: Alert and oriented x3 Makes eye contact with examiner.   Follows simple commands. Motor: B/l UE 5/5 proximal to distal LLE: HF, KE 4+/5, ADF 5/5 RLE: HF, KE 4-/5 (apin inhibition), ADF 5/5 Sensation intact to light touch  Psychiatric:        Mood and Affect: Mood normal.        Behavior: Behavior normal.    Results for orders placed or performed during the hospital encounter of 07/31/20 (from the past 24 hour(s))  Urine rapid drug screen (hosp performed)     Status: None   Collection Time: 08/01/20  8:59 AM  Result Value Ref Range   Opiates NONE DETECTED NONE DETECTED   Cocaine NONE DETECTED NONE DETECTED   Benzodiazepines NONE DETECTED NONE DETECTED   Amphetamines NONE DETECTED NONE DETECTED   Tetrahydrocannabinol NONE DETECTED NONE DETECTED   Barbiturates NONE DETECTED NONE DETECTED  Urinalysis, Routine w reflex microscopic     Status: Abnormal  Collection Time: 08/01/20  8:59 AM  Result Value Ref Range   Color, Urine YELLOW YELLOW   APPearance CLEAR CLEAR   Specific Gravity, Urine 1.017 1.005 - 1.030   pH 5.0 5.0 - 8.0   Glucose, UA NEGATIVE NEGATIVE mg/dL   Hgb urine dipstick NEGATIVE NEGATIVE   Bilirubin Urine NEGATIVE NEGATIVE   Ketones, ur 5 (A) NEGATIVE mg/dL   Protein, ur 30 (A) NEGATIVE mg/dL   Nitrite NEGATIVE NEGATIVE   Leukocytes,Ua NEGATIVE NEGATIVE   RBC / HPF 0-5 0 - 5 RBC/hpf   WBC, UA 0-5 0 - 5 WBC/hpf   Bacteria, UA RARE (A) NONE SEEN   Squamous Epithelial / LPF 0-5 0 - 5   Mucus PRESENT   Hemoglobin A1c     Status: Abnormal   Collection  Time: 08/01/20  9:52 AM  Result Value Ref Range   Hgb A1c MFr Bld 5.8 (H) 4.8 - 5.6 %   Mean Plasma Glucose 119.76 mg/dL  Lipid panel     Status: Abnormal   Collection Time: 08/01/20  9:52 AM  Result Value Ref Range   Cholesterol 123 0 - 200 mg/dL   Triglycerides 86 <150 mg/dL   HDL 34 (L) >40 mg/dL   Total CHOL/HDL Ratio 3.6 RATIO   VLDL 17 0 - 40 mg/dL   LDL Cholesterol 72 0 - 99 mg/dL  MRSA PCR Screening     Status: None   Collection Time: 08/01/20 12:35 PM   Specimen: Nasal Mucosa; Nasopharyngeal  Result Value Ref Range   MRSA by PCR NEGATIVE NEGATIVE   CT Head Wo Contrast  Result Date: 07/31/2020 CLINICAL DATA:  Head trauma.  Left-sided facial droop. EXAM: CT HEAD WITHOUT CONTRAST TECHNIQUE: Contiguous axial images were obtained from the base of the skull through the vertex without intravenous contrast. COMPARISON:  Brain MRI 04/12/2018 FINDINGS: Brain: Acute hemorrhage in the left thalamus, 23 x 35 x 14 mm. Small volume intraventricular extension in the dependent lateral ventricles. No hydrocephalus. No visible infarct, mass, or extra-axial collection. Vascular: No hyperdense vessel or unexpected calcification. Skull: Normal. Negative for fracture or focal lesion. Sinuses/Orbits: No evidence of injury.  Bilateral cataract resection Other: Critical Value/emergent results were called by telephone at the time of interpretation on 07/31/2020 at 10:49 am to provider Tyrone Nine, who verbally acknowledged these results. IMPRESSION: 6 cc acute hematoma in the left thalamus. Small volume intraventricular extension without hydrocephalus. Electronically Signed   By: Monte Fantasia M.D.   On: 07/31/2020 10:53   MR BRAIN WO CONTRAST  Result Date: 07/31/2020 CLINICAL DATA:  Follow-up examination for acute stroke. EXAM: MRI HEAD WITHOUT CONTRAST TECHNIQUE: Multiplanar, multiecho pulse sequences of the brain and surrounding structures were obtained without intravenous contrast. COMPARISON:  Prior head CT  from earlier the same day as well as previous MRI from 04/12/2018. FINDINGS: Brain: Generalized age-related cerebral atrophy. Patchy T2/FLAIR hyperintensity within the periventricular and deep white matter both cerebral hemispheres most consistent with chronic small vessel ischemic disease, stable. Previously identified acute hemorrhage centered at the left thalamus again seen, overall size and morphology is not significantly changed with the hemorrhage measuring 1.6 x 2.3 x 2.7 cm (estimated volume 5 cc). Mild surrounding vasogenic edema without significant regional mass effect. Associated intraventricular extension with blood seen layering within the occipital horns of both lateral ventricles. Stable ventricular size and morphology without hydrocephalus. Additional scattered siderosis noted about the cerebellum, likely related to redistribution. No other acute intracranial hemorrhage. Few additional chronic micro hemorrhages noted at  the left cerebellar vermis (series 11, image 16) and subcortical right parieto-occipital region (series 11, image 35), suspected to be hypertensive in nature. No other foci of restricted diffusion to suggest acute or subacute ischemia. No other areas of encephalomalacia to suggest chronic cortical infarction. No mass lesion or significant midline shift. No extra-axial fluid collection. No made of a partially empty sella. Midline structures intact. Vascular: Major intracranial vascular flow voids are well maintained and normal. Skull and upper cervical spine: Craniocervical junction within normal limits. Upper cervical spine normal. Bone marrow signal intensity within normal limits. No scalp soft tissue abnormality. Sinuses/Orbits: Patient status post bilateral ocular lens replacement. Globes and orbital soft tissues demonstrate no acute finding. Paranasal sinuses are largely clear. No significant mastoid effusion. Inner ear structures grossly normal. Other: None. IMPRESSION: 1. No  significant interval change in size and morphology of acute hemorrhage centered at the left thalamus. Mild surrounding vasogenic edema without significant regional mass effect. Associated intraventricular extension without hydrocephalus. 2. No other acute intracranial abnormality. 3. Age-related cerebral atrophy with mild chronic small vessel ischemic disease. Electronically Signed   By: Jeannine Boga M.D.   On: 07/31/2020 22:57    Assessment/Plan: Diagnosis: Left thalamic hematoma Stroke: Continue secondary stroke prophylaxis and Risk Factor Modification listed below:   Blood Pressure Management:  Continue current medication with prn's with permisive HTN per primary team PT/OT for mobility, ADL training  Labs and independently reviewed.  Records reviewed and summated above.  1. Does the need for close, 24 hr/day medical supervision in concert with the patient's rehab needs make it unreasonable for this patient to be served in a less intensive setting? No  2. Co-Morbidities requiring supervision/potential complications: memory loss, hyperlipidemia, ?AKI (avoid nephrotoxic meds, repeat labs, encourage fluids), elevated blood pressure (monitor and provide prns in accordance with increased physical exertion and pain), right knee pain likely secondary to OA (Voltaren gel) 3. Due to safety, skin/wound care, disease management, pain management and patient education, does the patient require 24 hr/day rehab nursing? No 4. Does the patient require coordinated care of a physician, rehab nurse, therapy disciplines of PT/OT/SLP to address physical and functional deficits in the context of the above medical diagnosis(es)? No Addressing deficits in the following areas: balance, endurance, locomotion, strength, transferring, bathing, dressing, toileting, cognition and psychosocial support 5. Can the patient actively participate in an intensive therapy program of at least 3 hrs of therapy per day at least 5  days per week? Yes 6. The potential for patient to make measurable gains while on inpatient rehab is good and fair 7. Anticipated functional outcomes upon discharge from inpatient rehab are modified independent and supervision  with PT, modified independent and supervision with OT, modified independent with SLP. 8. Estimated rehab length of stay to reach the above functional goals is: 4-6 days. 9. Anticipated discharge destination: Home 10. Overall Rehab/Functional Prognosis: excellent  RECOMMENDATIONS: This patient's condition is appropriate for continued rehabilitative care in the following setting: Patient refusing CIR and would like to go home.  Given functional level on day of evaluation, anticipate patient will continue to make progress and will not require CIR.  Recommend home with outpatient therapies at this time. Patient has agreed to participate in recommended program. No Note that insurance prior authorization may be required for reimbursement for recommended care.  Comment: Rehab Admissions Coordinator to follow up.  I have personally performed a face to face diagnostic evaluation, including, but not limited to relevant history and physical exam findings, of  this patient and developed relevant assessment and plan.  Additionally, I have reviewed and concur with the physician assistant's documentation above.   Delice Lesch, MD, ABPMR Lavon Paganini Angiulli, PA-C 08/02/2020

## 2020-08-02 NOTE — Progress Notes (Signed)
Physical Therapy Treatment Patient Details Name: Kenneth Harper MRN: [id number] DOB: 16-Oct-1940 Today's Date: 08/02/2020    History of Present Illness 80 yo male with onset of fall and memory changes for the events of the fall was brought to ED, found to have L thalamic acute hemorrhage of 5 cc volume, microhemorrhages L cerebellar vermis and subcortical R parieto-occipital areas, 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  New HTN dx, has HLD.  PMHx:  OA knees, asthma, CTS, cataracts, diverticulitis, GERD, memory loss    PT Comments    Pt complaining of significant R knee pain today, and frontal headache. Pt lacks insight into current mobility deficits, and continues to require min assist for mobility tasks. Pt ambulatory in hallway with use of RW today, but lacks awareness of R environment requiring frequent PT correction. PT had a lengthy discussion with both pt and pt's son about d/c plan, as PT and OT felt pt was not completely aware of mobility deficits, as well as need to d/c home fully prepared to help take care of his wife, when he refused CIR recommendation. Pt and family now on board with CIR admit, PT messaged CIR team.   Follow Up Recommendations  CIR     Equipment Recommendations  Rolling walker with 5" wheels    Recommendations for Other Services Rehab consult     Precautions / Restrictions Precautions Precautions: Fall Precaution Comments: monitor vitals Restrictions Weight Bearing Restrictions: No    Mobility  Bed Mobility Overal bed mobility: Needs Assistance             General bed mobility comments: up with OT upon arrival to room, requests stay in recliner  Transfers Overall transfer level: Needs assistance Equipment used: Rolling walker (2 wheeled);1 person hand held assist Transfers: Sit to/from Stand Sit to Stand: Min assist;Min guard         General transfer comment: Min assist when standing without AD for power up and steadying,  min guard with use of RW.  Ambulation/Gait Ambulation/Gait assistance: Min assist Gait Distance (Feet): 70 Feet Assistive device: Rolling walker (2 wheeled) Gait Pattern/deviations: Step-through pattern;Wide base of support;Decreased stride length;Trunk flexed;Drifts right/left Gait velocity: decr   General Gait Details: Min assist for correcting R drift in hallway, bumping into objects on R, steadying. Verbal cuing for use of RW.   Stairs             Wheelchair Mobility    Modified Rankin (Stroke Patients Only) Modified Rankin (Stroke Patients Only) Pre-Morbid Rankin Score: No symptoms Modified Rankin: Moderately severe disability     Balance Overall balance assessment: Needs assistance Sitting-balance support: Feet supported Sitting balance-Leahy Scale: Good     Standing balance support: Bilateral upper extremity supported Standing balance-Leahy Scale: Poor Standing balance comment: reliant on external assist                            Cognition Arousal/Alertness: Awake/alert Behavior During Therapy: WFL for tasks assessed/performed Overall Cognitive Status: Impaired/Different from baseline Area of Impairment: Safety/judgement;Problem solving;Awareness                     Memory: Decreased short-term memory   Safety/Judgement: Decreased awareness of safety;Decreased awareness of deficits Awareness: Emergent Problem Solving: Difficulty sequencing;Requires verbal cues;Requires tactile cues General Comments: Pt with decreased R-sided awareness, bumping into objects on R multiple times. Pt lacks insight into how present deficits correlate with function at home, but ultimately  decides that he does not feel safe with d/c home if he were to go home today      Exercises      General Comments General comments (skin integrity, edema, etc.): VSS      Pertinent Vitals/Pain Pain Assessment: Faces Faces Pain Scale: Hurts little more Pain  Location: R knee, frontal headache Pain Descriptors / Indicators: Discomfort;Grimacing;Sore Pain Intervention(s): Limited activity within patient's tolerance;Monitored during session;Repositioned    Home Living Family/patient expects to be discharged to:: Private residence Living Arrangements: Spouse/significant other Available Help at Discharge: Family;Available PRN/intermittently;Available 24 hours/day Type of Home: House Home Access: Level entry   Home Layout: One level Home Equipment: Kasandra Knudsen - single point Additional Comments: 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 wife does not require physical (A) but is unable to manage more than supervision care. Son    Prior Function Level of Independence: Independent          PT Goals (current goals can now be found in the care plan section) Acute Rehab PT Goals Patient Stated Goal: to get stronger PT Goal Formulation: With patient Time For Goal Achievement: 08/15/20 Potential to Achieve Goals: Good Progress towards PT goals: Progressing toward goals    Frequency    Min 4X/week      PT Plan Discharge plan needs to be updated;Equipment recommendations need to be updated    Co-evaluation              AM-PAC PT "6 Clicks" Mobility   Outcome Measure  Help needed turning from your back to your side while in a flat bed without using bedrails?: A Little Help needed moving from lying on your back to sitting on the side of a flat bed without using bedrails?: A Little Help needed moving to and from a bed to a chair (including a wheelchair)?: A Little Help needed standing up from a chair using your arms (e.g., wheelchair or bedside chair)?: A Little Help needed to walk in hospital room?: A Little Help needed climbing 3-5 steps with a railing? : A Lot 6 Click Score: 17    End of Session   Activity Tolerance: Patient limited by fatigue;Treatment limited secondary to medical complications  (Comment) Patient left: in chair;with call bell/phone within reach;with family/visitor present Nurse Communication: Mobility status PT Visit Diagnosis: Unsteadiness on feet (R26.81);Muscle weakness (generalized) (M62.81);History of falling (Z91.81);Hemiplegia and hemiparesis Hemiplegia - Right/Left: Right Hemiplegia - dominant/non-dominant: Dominant     Time: 1610-9604 PT Time Calculation (min) (ACUTE ONLY): 18 min  Charges:  $Gait Training: 8-22 mins                     Stacie Glaze, PT Acute Rehabilitation Services Pager [phone]  Office [phone]   Louis Matte 08/02/2020, 5:51 PM

## 2020-08-03 ENCOUNTER — Inpatient Hospital Stay (HOSPITAL_COMMUNITY): Payer: Medicare HMO

## 2020-08-03 NOTE — Progress Notes (Addendum)
PROGRESS NOTE  Kenneth Harper IOX:[id number] DOB: 05/08/1941 DOA: 07/31/2020 PCP: Wenda Low, MD  HPI/Recap of past 24 hours: Kenneth Harper is a 80 y.o. male with history of memory loss and hypercholesterolemia who presents with numbness that started yesterday.  States that around 1230 or one he was out of his house when he felt like he was unable to do for himself as he normally is, though is very vague about exactly what symptoms he felt.  He states that he now feels almost completely back to normal.  .  He was admitted to ICU by stroke team.  [facility]ist has been consulted to take over management of patient's medical needs  Subjective August 03, 2020: Patient seen and examined at bedside.  Nurse had reported patient was having pain to the right knee which made it difficult to walk.  Assessment/Plan: Active Problems:   ICH (intracerebral hemorrhage) (HCC)   Hemorrhagic stroke (HCC)   Memory loss   Dyslipidemia   Elevated blood pressure reading   AKI (acute kidney injury) ([place])   Primary osteoarthritis of right knee  #1 intracranial hemorrhage neurology consulted no antiplatelet or anticoagulation  2.  Hypertension we will allow permissive blood pressure due to history of intracranial hemorrhage  3.  Right knee pain.  This decreased his mobility and ability to participate in physical therapy today.  X-ray will be obtained, orthopedic consult will be obtained.  Continue physical therapy  4.  Hypertension.  Continue amlodipine.  He was on Cardene drip yesterday but that has been discontinued  Code Status: Full  Severity of Illness: The appropriate patient status for this patient is INPATIENT. Inpatient status is judged to be reasonable and necessary in order to provide the required intensity of service to ensure the patient's safety. The patient's presenting symptoms, physical exam findings, and initial radiographic and laboratory data in the context of their chronic  comorbidities is felt to place them at high risk for further clinical deterioration. Furthermore, it is not anticipated that the patient will be medically stable for discharge from the hospital within 2 midnights of admission. The following factors support the patient status of inpatient.   " The patient's presenting symptoms include intracranial hemorrhage. " The worrisome physical exam findings include intracranial hemorrhage. " The initial radiographic and laboratory data are worrisome because of intracranial hemorrhage. " The chronic co-morbidities include hypertension.   * I certify that at the point of admission it is my clinical judgment that the patient will require inpatient hospital care spanning beyond 2 midnights from the point of admission due to high intensity of service, high risk for further deterioration and high frequency of surveillance required.*    Family Communication: Discussed with patient no other family at bedside  Disposition Plan: Home when stable   Consultants:  Neurology  Procedures:  None  Antimicrobials:  None  DVT prophylaxis: ICD   Objective: Vitals:   08/02/20 1500 08/02/20 1632 08/02/20 2043 08/03/20 0428  BP: 126/79 (!) 149/75 (!) 141/74 (!) 157/94  Pulse: 79 88 92 (!) 101  Resp: (!) 24 20 20 18   Temp:  (!) 97.5 F (36.4 C) 98.8 F (37.1 C) 98 F (36.7 C)  TempSrc:  Oral Oral Oral  SpO2: 98% 98% 97% 96%  Weight:      Height:        Intake/Output Summary (Last 24 hours) at 08/03/2020 0907 Last data filed at 08/03/2020 0540 Gross per 24 hour  Intake 876.38 ml  Output  540 ml  Net 336.38 ml   Filed Weights   07/31/20 1000  Weight: 104.3 kg   Body mass index is 32.08 kg/m.  Exam:  . General: 80 y.o. year-old male well developed well nourished in no acute distress.  Alert and oriented x3.  Slightly overweight . Cardiovascular: Regular rate and rhythm with no rubs or gallops.  No thyromegaly or JVD noted.   Marland Kitchen Respiratory:  Clear to auscultation with no wheezes or rales. Good inspiratory effort. . Abdomen: Soft nontender nondistended with normal bowel sounds x4 quadrants. . Musculoskeletal: No lower extremity edema. 2/4 pulses in all 4 extremities. . Skin: No ulcerative lesions noted or rashes, . Psychiatry: Mood is appropriate for condition and setting    Data Reviewed: CBC: Recent Labs  Lab 07/31/20 1112 08/02/20 1558  WBC 5.6 7.4  NEUTROABS 3.6 4.9  HGB 14.9 14.6  HCT 46.3 44.4  MCV 91.7 90.1  PLT 185 0000000   Basic Metabolic Panel: Recent Labs  Lab 07/31/20 1112 08/02/20 1558  NA 139 138  K 4.0 4.3  CL 108 105  CO2 20* 25  GLUCOSE 124* 129*  BUN 15 16  CREATININE 1.35* 1.38*  CALCIUM 8.9 8.9   GFR: Estimated Creatinine Clearance: 54.2 mL/min (A) (by C-G formula based on SCr of 1.38 mg/dL (H)). Liver Function Tests: Recent Labs  Lab 07/31/20 1112  AST 26  ALT 17  ALKPHOS 71  BILITOT 0.6  PROT 6.5  ALBUMIN 3.5   No results for input(s): LIPASE, AMYLASE in the last 168 hours. No results for input(s): AMMONIA in the last 168 hours. Coagulation Profile: Recent Labs  Lab 07/31/20 1112  INR 1.1   Cardiac Enzymes: No results for input(s): CKTOTAL, CKMB, CKMBINDEX, TROPONINI in the last 168 hours. BNP (last 3 results) No results for input(s): PROBNP in the last 8760 hours. HbA1C: Recent Labs    08/01/20 0952  HGBA1C 5.8*   CBG: No results for input(s): GLUCAP in the last 168 hours. Lipid Profile: Recent Labs    08/01/20 0952  CHOL 123  HDL 34*  LDLCALC 72  TRIG 86  CHOLHDL 3.6   Thyroid Function Tests: No results for input(s): TSH, T4TOTAL, FREET4, T3FREE, THYROIDAB in the last 72 hours. Anemia Panel: No results for input(s): VITAMINB12, FOLATE, FERRITIN, TIBC, IRON, RETICCTPCT in the last 72 hours. Urine analysis:    Component Value Date/Time   COLORURINE YELLOW 08/01/2020 0859   APPEARANCEUR CLEAR 08/01/2020 0859   LABSPEC 1.017 08/01/2020 0859   PHURINE  5.0 08/01/2020 0859   GLUCOSEU NEGATIVE 08/01/2020 0859   HGBUR NEGATIVE 08/01/2020 0859   BILIRUBINUR NEGATIVE 08/01/2020 0859   KETONESUR 5 (A) 08/01/2020 0859   PROTEINUR 30 (A) 08/01/2020 0859   NITRITE NEGATIVE 08/01/2020 0859   LEUKOCYTESUR NEGATIVE 08/01/2020 0859   Sepsis Labs: @LABRCNTIP (procalcitonin:4,lacticidven:4)  ) Recent Results (from the past 240 hour(s))  Resp Panel by RT-PCR (Flu A&B, Covid) Nasopharyngeal Swab     Status: None   Collection Time: 07/31/20 11:30 AM   Specimen: Nasopharyngeal Swab; Nasopharyngeal(NP) swabs in vial transport medium  Result Value Ref Range Status   SARS Coronavirus 2 by RT PCR NEGATIVE NEGATIVE Final    Comment: (NOTE) SARS-CoV-2 target nucleic acids are NOT DETECTED.  The SARS-CoV-2 RNA is generally detectable in upper respiratory specimens during the acute phase of infection. The lowest concentration of SARS-CoV-2 viral copies this assay can detect is 138 copies/mL. A negative result does not preclude SARS-Cov-2 infection and should not be used  as the sole basis for treatment or other patient management decisions. A negative result may occur with  improper specimen collection/handling, submission of specimen other than nasopharyngeal swab, presence of viral mutation(s) within the areas targeted by this assay, and inadequate number of viral copies(<138 copies/mL). A negative result must be combined with clinical observations, patient history, and epidemiological information. The expected result is Negative.  Fact Sheet for Patients:  EntrepreneurPulse.com.au  Fact Sheet for Healthcare Providers:  IncredibleEmployment.be  This test is no t yet approved or cleared by the [place] FDA and  has been authorized for detection and/or diagnosis of SARS-CoV-2 by FDA under an Emergency Use Authorization (EUA). This EUA will remain  in effect (meaning this test can be used) for the duration of  the COVID-19 declaration under Section 564(b)(1) of the Act, 21 U.S.C.section 360bbb-3(b)(1), unless the authorization is terminated  or revoked sooner.       Influenza A by PCR NEGATIVE NEGATIVE Final   Influenza B by PCR NEGATIVE NEGATIVE Final    Comment: (NOTE) The Xpert Xpress SARS-CoV-2/FLU/RSV plus assay is intended as an aid in the diagnosis of influenza from Nasopharyngeal swab specimens and should not be used as a sole basis for treatment. Nasal washings and aspirates are unacceptable for Xpert Xpress SARS-CoV-2/FLU/RSV testing.  Fact Sheet for Patients: EntrepreneurPulse.com.au  Fact Sheet for Healthcare Providers: IncredibleEmployment.be  This test is not yet approved or cleared by the [place] FDA and has been authorized for detection and/or diagnosis of SARS-CoV-2 by FDA under an Emergency Use Authorization (EUA). This EUA will remain in effect (meaning this test can be used) for the duration of the COVID-19 declaration under Section 564(b)(1) of the Act, 21 U.S.C. section 360bbb-3(b)(1), unless the authorization is terminated or revoked.  Performed at [facility], [place] [address], [place], [place] [address]   MRSA PCR Screening     Status: None   Collection Time: 08/01/20 12:35 PM   Specimen: Nasal Mucosa; Nasopharyngeal  Result Value Ref Range Status   MRSA by PCR NEGATIVE NEGATIVE Final    Comment:        The GeneXpert MRSA Assay (FDA approved for NASAL specimens only), is one component of a comprehensive MRSA colonization surveillance program. It is not intended to diagnose MRSA infection nor to guide or monitor treatment for MRSA infections. Performed at [facility], [place] [address], [place], [place] [address]       Studies: No results found.  Scheduled Meds: . amLODipine  5 mg Oral Daily  . Chlorhexidine Gluconate Cloth  6 each Topical Daily  . pantoprazole  40 mg Oral QHS  .  senna-docusate  1 tablet Oral BID    Continuous Infusions: . niCARDipine 2.5 mg/hr (08/02/20 0905)     LOS: 3 days     [name], MD [facility]  To reach me or the doctor on call, go to: [email or web address] Password [email or web address]  08/03/2020, 9:07 AM

## 2020-08-03 NOTE — Progress Notes (Addendum)
Patient requested ice pack for his right knee; does not request pain medication at this time; legs are elevated in recliner after getting up with therapy.  Son at bedside.

## 2020-08-03 NOTE — Plan of Care (Signed)
Spoke with Dr Kyung Bacca. She agrees with Dr Leonie Man that pt. needs to be seen by ortho. Called Dr Percell Miller who is on call for unassigned at [facility]. He will review the pending x-rays and see the pt today.  Mikey Bussing PA-C [facility] Pager [phone] 08/03/2020, 1:25 PM

## 2020-08-03 NOTE — Progress Notes (Signed)
Occupational Therapy Treatment Patient Details Name: Kenneth Harper MRN: [id number] DOB: [birth year] Today's Date: 08/03/2020    History of present illness 80 yo male with onset of fall and memory changes for the events of the fall was brought to ED, found to have L thalamic acute hemorrhage of 5 cc volume, microhemorrhages L cerebellar vermis and subcortical R parieto-occipital areas, 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  New HTN dx, has HLD.  PMHx:  OA knees, asthma, CTS, cataracts, diverticulitis, GERD, memory loss   OT comments  Pt making steady progress towards OT goals this session. Pt continues to present with R knee pain, decreased activity tolerance and increased WOB during functional tasks. Pt able to complete UB/LB ADLs at sink, toileting and functional during session with pt needing the most assist to power into standing d/t R knee pain. Pt currently requires up to MOD A for LB/UB ADLs from recliner. Pt HR increase to as much as 123 bpm with activity, increased WOB however SpO2 >95% during session. Initiated education on energy conservation strategies d/t decreased activity tolerance however pt would continue to benefit from further education. Pt would continue to benefit from skilled occupational therapy while admitted and after d/c to address the below listed limitations in order to improve overall functional mobility and facilitate independence with BADL participation. DC plan remains appropriate, will follow acutely per POC.      Follow Up Recommendations  CIR    Equipment Recommendations  Other (comment) (RW)    Recommendations for Other Services      Precautions / Restrictions Precautions Precautions: Fall Precaution Comments: monitor vitals Restrictions Weight Bearing Restrictions: No       Mobility Bed Mobility Overal bed mobility: Needs Assistance Bed Mobility: Supine to Sit     Supine to sit: Min guard;HOB elevated     General bed  mobility comments: min guard for line mgmt and cues to scoot forward to position feet on floor  Transfers Overall transfer level: Needs assistance Equipment used: Rolling walker (2 wheeled) Transfers: Sit to/from Stand Sit to Stand: Min assist;Mod assist         General transfer comment: MIN A from EOB but MOD A from toilet and as pt fatigues    Balance Overall balance assessment: Needs assistance Sitting-balance support: Feet supported Sitting balance-Leahy Scale: Good     Standing balance support: Bilateral upper extremity supported;Single extremity supported;During functional activity Standing balance-Leahy Scale: Poor Standing balance comment: reliant on at least one UE supported during ADLs                           ADL either performed or assessed with clinical judgement   ADL Overall ADL's : Needs assistance/impaired     Grooming: Wash/dry face;Oral care;Standing;Sitting;Minimal assistance Grooming Details (indicate cue type and reason): MIN A for standing balance with RW to complete oral care at sink Upper Body Bathing: Moderate assistance;Sitting Upper Body Bathing Details (indicate cue type and reason): UB bathing seated at sink Lower Body Bathing: Moderate assistance;Sit to/from stand Lower Body Bathing Details (indicate cue type and reason): MOD A for LB bathing standing at sink, MOD A to stand Upper Body Dressing : Minimal assistance;Sitting Upper Body Dressing Details (indicate cue type and reason): to don new gown Lower Body Dressing: Moderate assistance;Sit to/from stand Lower Body Dressing Details (indicate cue type and reason): assist to pull pants up to waist line Toilet Transfer: Minimal assistance;RW;Ambulation;Regular Toilet;Moderate assistance;Cueing for safety;Grab bars  Toilet Transfer Details (indicate cue type and reason): MIN A to ambulate into toilet, MOD A to power up from low toilet seat, cues for hand placement on grab bars Toileting-  Clothing Manipulation and Hygiene: Sit to/from stand;Moderate assistance;Minimal assistance Toileting - Clothing Manipulation Details (indicate cue type and reason): pt able to complete posterior pericare post BM but required MIN- MOD A for standing balance during dynamic tasks     Functional mobility during ADLs: Minimal assistance;Moderate assistance;Rolling walker General ADL Comments: pt much more willing to use RW for ambulation, in comparision to previous OT session. Pt able to complete toileting, UB/LB ADLs at sink and functional mobility with up to [facility]. pt limited by decreased activity tolerance and increased WOB with activity tolerance however SpO2 WFL, HR increased to 123 bpm with mobility and ADL tasks     Vision       Perception     Praxis      Cognition Arousal/Alertness: Awake/alert Behavior During Therapy: WFL for tasks assessed/performed Overall Cognitive Status: Impaired/Different from baseline Area of Impairment: Safety/judgement                         Safety/Judgement: Decreased awareness of safety;Decreased awareness of deficits (unable to state why he is in hospital, cues to take breaks at pt seems to be unaware of decreased activity tolerance. pt also states his R knee doesn't hurt him but then noted to shift weight off R knee in standing)              Exercises     Shoulder Instructions       General Comments pt HR increase to as much as 123 bpm with activity, increased WOB however O2 >95% during session. pts son present and helpful during session. Pt enjoys fishing and being outdoors.     Pertinent Vitals/ Pain       Pain Assessment: Faces Faces Pain Scale: Hurts even more Pain Location: R knee with WB'ing Pain Descriptors / Indicators: Discomfort;Grimacing;Sore Pain Intervention(s): Limited activity within patient's tolerance;Monitored during session;Repositioned;Other (comment) (asked RN to ask MD about imaging?)  Home Living                                           Prior Functioning/Environment              Frequency  Min 2X/week        Progress Toward Goals  OT Goals(current goals can now be found in the care plan section)  Progress towards OT goals: Progressing toward goals  Acute Rehab OT Goals Patient Stated Goal: to get stronger OT Goal Formulation: With patient/family Time For Goal Achievement: 08/16/20 Potential to Achieve Goals: Good  Plan Discharge plan remains appropriate;Frequency remains appropriate    Co-evaluation                 AM-PAC OT "6 Clicks" Daily Activity     Outcome Measure   Help from another person eating meals?: None Help from another person taking care of personal grooming?: A Little Help from another person toileting, which includes using toliet, bedpan, or urinal?: A Lot Help from another person bathing (including washing, rinsing, drying)?: A Little Help from another person to put on and taking off regular upper body clothing?: A Little Help from another person to put on and taking off regular lower  body clothing?: A Lot 6 Click Score: 17    End of Session Equipment Utilized During Treatment: Gait belt;Rolling walker  OT Visit Diagnosis: Unsteadiness on feet (R26.81);Pain Pain - Right/Left: Right Pain - part of body: Knee   Activity Tolerance Patient tolerated treatment well   Patient Left in chair;with call bell/phone within reach;with family/visitor present   Nurse Communication Mobility status        Time: 7124-5809 OT Time Calculation (min): 52 min  Charges: OT General Charges $OT Visit: 1 Visit OT Treatments $Self Care/Home Management : 38-52 mins  Harley Alto., COTA/L Acute Rehabilitation Services [phone]    Kenneth Harper 08/03/2020, 11:17 AM

## 2020-08-03 NOTE — Progress Notes (Signed)
STROKE TEAM PROGRESS NOTE   INTERVAL HISTORY Patient is sitting in bedside chair.  His right knee is supported and elevated and complains of pain.  He states he fell when he had his stroke and hurt his right knee and is requesting evaluation for this.  X-rays of his right knee have been ordered.    Vitals:   08/02/20 1632 08/02/20 2043 08/03/20 0428 08/03/20 1001  BP: (!) 149/75 (!) 141/74 (!) 157/94 (!) 144/79  Pulse: 88 92 (!) 101 (!) 105  Resp: 20 20 18 16   Temp: (!) 97.5 F (36.4 C) 98.8 F (37.1 C) 98 F (36.7 C) 98.7 F (37.1 C)  TempSrc: Oral Oral Oral Oral  SpO2: 98% 97% 96% 98%  Weight:      Height:       CBC:  Recent Labs  Lab 07/31/20 1112 08/02/20 1558  WBC 5.6 7.4  NEUTROABS 3.6 4.9  HGB 14.9 14.6  HCT 46.3 44.4  MCV 91.7 90.1  PLT 185 397   Basic Metabolic Panel:  Recent Labs  Lab 07/31/20 1112 08/02/20 1558  NA 139 138  K 4.0 4.3  CL 108 105  CO2 20* 25  GLUCOSE 124* 129*  BUN 15 16  CREATININE 1.35* 1.38*  CALCIUM 8.9 8.9  Lipid Panel:  Recent Labs  Lab 08/01/20 0952  CHOL 123  TRIG 86  HDL 34*  CHOLHDL 3.6  VLDL 17  LDLCALC 72   HgbA1c:  Recent Labs  Lab 08/01/20 0952  HGBA1C 5.8*   Urine Drug Screen:  Recent Labs  Lab 08/01/20 0859  LABOPIA NONE DETECTED  COCAINSCRNUR NONE DETECTED  LABBENZ NONE DETECTED  AMPHETMU NONE DETECTED  THCU NONE DETECTED  LABBARB NONE DETECTED    Alcohol Level  Recent Labs  Lab 07/31/20 1112  ETH <10   IMAGING past 24 hours No results found.    Physical Exam  Constitutional: Appears well-developed and well-nourished elderly obese Caucasian male.  Psych: Affect appropriate to situation Eyes: No scleral injection HENT: No OP obstrucion Head: Normocephalic.  Cardiovascular: Normal rate and regular rhythm.  Respiratory: Effort normal and breath sounds normal to anterior ascultation GI: Soft.  No distension. There is no tenderness.  Skin: WDI Right knee swelling and pain with some  redness on the lateral aspect. Neuro: Mental Status: Patient is awake, alert, oriented to person, place, month, year, and situation. Patient is able to give a clear and coherent history. No signs of neglect. He has an increased latency of response.  Cranial Nerves: II: Visual Fields are full. Pupils are equal, round, and reactive to light.   III,IV, VI: EOMI without ptosis or diplopia.  V: Facial sensation is symmetric to temperature VII: Facial movement is symmetric.  VIII: hearing is intact to voice X: Uvula is midline and palate elevates symmetrically XI: Shoulder shrug is symmetric. XII: tongue is midline without atrophy or fasciculations.  Motor: Tone is normal. Bulk is normal. 5/5 strength was present in all four extremities.  Diminished fine finger movements on the right.  Orbits left to right upper extremity.  Mild right grip weakness. Sensory: Sensation is symmetric to light touch and temperature in the arms and legs. Cerebellar: FNF intact bilaterally  ASSESSMENT/PLAN [name] is a 80 y.o. male with history of memory loss, hearing loss, CKD stage 2, HLD, gout, obesity and hypercholesterolemia who presents with frontal headache for several hours, vague feeling of not doing well, s/p unwitnessed fall on the day of admission with denied  LOC. He presented to [facility] ED for evaluation at the insistence of his wife. NIHSS 0.   ICH: Large thalamic hemorrhage with associated vasogenic edema without mass effect likely related to uncontrolled previously unknown hypertension.  HOLD all antiplatelets or anticoagulants  Repeat HCT stat for decreased mental status or neurologic worsening  Code Stroke CT  shows thalamic hemorrhage   MRI: No significant interval change in size and morphology of acutehemorrhage centered at the left thalamus. Mild surrounding vasogenicedema without significant regional mass effect. Associatedintraventricular extension without hydrocephalus. Age-related  cerebral atrophy with mild chronic small vessel ischemic disease.  Echo: Left ventricular ejection fraction, by estimation, is 55 to 60%.  LDL - 72   HgbA1c - 5.8  VTE prophylaxis -SCDs    Diet   Diet regular Room service appropriate? Yes with Assist; Fluid consistency: Thin    Therapy recommendations:  CIR recommended  Disposition:  Unclear discharge plan, Patient has declined CIR for now. Pending his making progress the plan is tentatively home with outpatient vs. home therapies. However, he must make progress for this to work.  Hospitalist team, Dr. Sloan Leiter, contacted to assume primary management upon transfer to floor.   Hypertension, previously unknown  Currently on Nicardipine drip  Norvasc 5mg  daily ; Labetalol prn  Blood pressure control with goal systolic 836 (SBP 629'U to 150's today) . Long-term BP goal normotensive  Hyperlipidemia  Home meds: Lipitor 40 mg on hold due to hemorrhage. May resume at discharge.   LDL 72, goal < 70  Other Stroke Risk Factors  Advanced Age >/= 73   History of smoking: reports that he quit smoking about 9 years ago. His smoking use included cigars. He quit after 42.00 years of use. He has never used smokeless tobacco.   He reports that he does not drink alcohol and does not use drugs.  Obesity, Body mass index is 32.08 kg/m., BMI >/= 30 associated with increased stroke risk, recommend weight loss, diet and exercise as appropriate   Other Active Problems  Right knee pain and swelling.-Try local heat and ice pack.  Tylenol 3 every 6 hours as needed.   Hospital day # 3 Recommend continue close blood pressure monitoring and ongoing therapies.  Check x-rays of the right knee and if unyielding may consider orthopedic referral traumatic right knee pain and swelling.  Hopefully transfer to inpatient rehab over the next few days when bed available.  Long discussion with patient and son at the bedside and answered questions.  Greater  than 50% time during this 25-minute visit was spent on counseling coordination of care) intracerebral pain answering questions.  Antony Contras, MD  To contact Stroke Continuity provider, please refer to [email or web address] After hours, contact General Neurology

## 2020-08-03 NOTE — Progress Notes (Signed)
Physical Therapy Treatment Patient Details Name: Kenneth Harper MRN: [id number] DOB: 15-Aug-1940 Today's Date: 08/03/2020    History of Present Illness 80 yo male with onset of fall and memory changes for the events of the fall was brought to ED, found to have L thalamic acute hemorrhage of 5 cc volume, microhemorrhages L cerebellar vermis and subcortical R parieto-occipital areas, 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  New HTN dx, has HLD.  PMHx:  OA knees, asthma, CTS, cataracts, diverticulitis, GERD, memory loss    PT Comments    Today's skilled session was limited to exercises only due to pt with complaints of right knee pain and not wanting to get up again after already being up with OT prior to PT session. RN aware of knee pain. Ice pack replaced after session. Acute PT to continue during pt's hospital stay.    Follow Up Recommendations  CIR     Equipment Recommendations  Rolling walker with 5" wheels    Recommendations for Other Services Rehab consult     Precautions / Restrictions Precautions Precautions: Fall Precaution Comments: monitor vitals Restrictions Weight Bearing Restrictions: No    Mobility  Bed Mobility Overal bed mobility: Needs Assistance     General bed mobility comments: OOB in chair before and after session  Transfers Overall transfer level: Needs assistance          General transfer comment: pt deferred standing/gait due to right knee pain   Modified Rankin (Stroke Patients Only) Modified Rankin (Stroke Patients Only) Pre-Morbid Rankin Score: No symptoms Modified Rankin: Moderately severe disability     Cognition Arousal/Alertness: Awake/alert Behavior During Therapy: WFL for tasks assessed/performed Overall Cognitive Status: Impaired/Different from baseline Area of Impairment: Safety/judgement                     Memory: Decreased short-term memory   Safety/Judgement: Decreased awareness of  safety;Decreased awareness of deficits Awareness: Emergent Problem Solving: Difficulty sequencing;Requires verbal cues;Requires tactile cues General Comments: cues to task needed. pt unable to state why he was at the hospital.      Exercises General Exercises - Lower Extremity Ankle Circles/Pumps: AROM;Strengthening;Both;10 reps;Seated;Limitations Ankle Circles/Pumps Limitations: with foot rest of chair up, manual resistance to PF Long Arc Quad: AAROM;Strengthening;Right;10 reps;Seated;Limitations Long [employer] Limitations: assistance needed for slow, controlled movements Straight Leg Raises: AAROM;Strengthening;Right;10 reps;Seated;Limitations Straight Leg Raises Limitations: assistance needed for slow, controlled movements Hip Flexion/Marching: AAROM;Strengthening;Right;10 reps;Seated;Limitations Hip Flexion/Marching Limitations: assistance needed to clear foot off floor and for controlled movements. Toe Raises: AROM;Both;10 reps;Seated;Limitations Toe Raises Limitations: minimal raise noted on both sides Heel Raises: AROM;Both;10 reps;Seated     Pertinent Vitals/Pain Pain Assessment: 0-10 Pain Score: 8  Faces Pain Scale: Hurts even more Pain Location: R knee with movement Pain Descriptors / Indicators: Discomfort;Grimacing;Sore Pain Intervention(s): Limited activity within patient's tolerance;Monitored during session;Ice applied (reapplied ice bag at end of session)     PT Goals (current goals can now be found in the care plan section) Acute Rehab PT Goals Patient Stated Goal: to get stronger PT Goal Formulation: With patient Time For Goal Achievement: 08/15/20 Potential to Achieve Goals: Good Progress towards PT goals: Progressing toward goals    Frequency    Min 4X/week      PT Plan Current plan remains appropriate    AM-PAC PT "6 Clicks" Mobility   Outcome Measure  Help needed turning from your back to your side while in a flat bed without using bedrails?: A  Little Help needed  moving from lying on your back to sitting on the side of a flat bed without using bedrails?: A Little Help needed moving to and from a bed to a chair (including a wheelchair)?: A Little Help needed standing up from a chair using your arms (e.g., wheelchair or bedside chair)?: A Little Help needed to walk in hospital room?: A Little Help needed climbing 3-5 steps with a railing? : A Lot 6 Click Score: 17    End of Session   Activity Tolerance: Patient limited by pain Patient left: in chair;with call bell/phone within reach;with family/visitor present Nurse Communication: Mobility status PT Visit Diagnosis: Unsteadiness on feet (R26.81);Muscle weakness (generalized) (M62.81);History of falling (Z91.81);Hemiplegia and hemiparesis Hemiplegia - Right/Left: Right Hemiplegia - dominant/non-dominant: Dominant     Time: 5379-4327 PT Time Calculation (min) (ACUTE ONLY): 10 min  Charges:  $Therapeutic Exercise: 8-22 mins                     Willow Ora, PTA, [facility] Acute Rehab Services Office- [phone] 08/03/20, 1:20 PM  Willow Ora 08/03/2020, 1:19 PM

## 2020-08-03 NOTE — Consult Note (Signed)
ORTHOPAEDIC CONSULTATION  REQUESTING PHYSICIAN: [name], MD  Chief Complaint: Right Knee Pain  HPI: Kenneth Harper is a 80 y.o. male who complains of  Right knee pain s/p fall after stroke. Pt. States some pain located to the medial side of the right knee. Denies pain with standing. Some increased pain with walking.   Imaging shows right knee joint effusion and advanced OA..  Orthopedics was consulted for evaluation.    Denies history of MI, DVT, PE.  Previously ambulatory .    Past Medical History:  Diagnosis Date  . Arthritis    knees and back  . Asthma    childhood  . Carpal tunnel syndrome   . Cataract   . Diverticulitis   . GERD (gastroesophageal reflux disease)   . Hypercholesterolemia   . Memory loss    Past Surgical History:  Procedure Laterality Date  . CATARACT EXTRACTION, BILATERAL Bilateral   . COLONOSCOPY WITH PROPOFOL N/A 10/29/2014   Procedure: COLONOSCOPY WITH PROPOFOL;  Surgeon: Garlan Fair, MD;  Location: [facility];  Service: Endoscopy;  Laterality: N/A;  . EYE SURGERY    . LUMBAR LAMINECTOMY/DECOMPRESSION MICRODISCECTOMY N/A 10/01/2015   Procedure: LEFT L2-3 MICRODISCECTOMY;  Surgeon: Jessy Oto, MD;  Location: [facility];  Service: Orthopedics;  Laterality: N/A;   Social History   Socioeconomic History  . Marital status: Married    Spouse name: Not on file  . Number of children: Not on file  . Years of education: Not on file  . Highest education level: Not on file  Occupational History  . Not on file  Tobacco Use  . Smoking status: Former Smoker    Years: 42.00    Types: Cigars    Quit date: 10/22/2010    Years since quitting: 9.7  . Smokeless tobacco: Never Used  Vaping Use  . Vaping Use: Never used  Substance and Sexual Activity  . Alcohol use: No    Alcohol/week: 0.0 standard drinks  . Drug use: No  . Sexual activity: Never    Birth control/protection: Abstinence  Other Topics Concern  . Not on file  Social  History Narrative   Lives home with wife.  Works at [employer].  Education HS.  Children 2. Caffeine 2-4 cups daily.     Social Determinants of Health   Financial Resource Strain: Not on file  Food Insecurity: Not on file  Transportation Needs: Not on file  Physical Activity: Not on file  Stress: Not on file  Social Connections: Not on file   Family History  Problem Relation Age of Onset  . Parkinson's disease Mother   . Heart failure Father   . Diabetes Brother   . Cancer Brother        throat  . Hydrocephalus Brother   . Diabetes Brother   . Migraines Sister    No Known Allergies Prior to Admission medications   Medication Sig Start Date End Date Taking? Authorizing Provider  atorvastatin (LIPITOR) 40 MG tablet Take 40 mg by mouth daily.  08/22/14  Yes [provider]   DG Knee 1-2 Views Right  Result Date: 08/03/2020 CLINICAL DATA:  80 year old male with history of trauma from a fall 5 days ago complaining of right anterior knee pain. EXAM: RIGHT KNEE - 1-2 VIEW COMPARISON:  06/21/2018. FINDINGS: No definite acute displaced fracture or dislocation. Severe joint space narrowing, subchondral sclerosis, subchondral cyst formation and osteophyte formation is again noted, most pronounced in the  medial and patellofemoral compartments. Large suprapatellar joint effusion. Vascular calcifications noted in the medial aspect of the right thigh. IMPRESSION: 1. No acute displaced fracture or dislocation. 2. Large suprapatellar joint effusion. 3. Severe tricompartmental osteoarthritis, most pronounced in the medial and patellofemoral compartments. Electronically Signed   By: Vinnie Langton M.D.   On: 08/03/2020 14:25   ECHOCARDIOGRAM COMPLETE  Result Date: 08/02/2020    ECHOCARDIOGRAM REPORT   Patient Name:   Kenneth Harper [facility] Date of Exam: 08/02/2020 Medical Rec #:  [id number]       Height:       71.0 in Accession #:    [id number]      Weight:       230.0 lb Date of Birth:   [birth year]       BSA:          2.238 m Patient Age:    50 years        BP:           109/63 mmHg Patient Gender: M               HR:           92 bpm. Exam Location:  Inpatient Procedure: 2D Echo, Cardiac Doppler and Color Doppler Indications:    Stroke 434.91 / I163.9  History:        Patient has no prior history of Echocardiogram examinations.                 Risk Factors:Dyslipidemia and GERD.  Sonographer:    Jonelle Sidle Dance Referring Phys: Merlín.Osler MCNEILL P Prescott  1. Left ventricular ejection fraction, by estimation, is 55 to 60%. The left ventricle has normal function. The left ventricle has no regional wall motion abnormalities. Left ventricular diastolic parameters are consistent with Grade I diastolic dysfunction (impaired relaxation). Elevated left ventricular end-diastolic pressure.  2. Right ventricular systolic function is normal. The right ventricular size is normal. There is normal pulmonary artery systolic pressure.  3. The mitral valve is normal in structure. No evidence of mitral valve regurgitation. No evidence of mitral stenosis.  4. The aortic valve is normal in structure. There is mild calcification of the aortic valve. There is mild thickening of the aortic valve. Aortic valve regurgitation is not visualized. No aortic stenosis is present.  5. Aortic dilatation noted. There is mild dilatation of the ascending aorta, measuring 37 mm.  6. The inferior vena cava is dilated in size with >50% respiratory variability, suggesting right atrial pressure of 8 mmHg. FINDINGS  Left Ventricle: Left ventricular ejection fraction, by estimation, is 55 to 60%. The left ventricle has normal function. The left ventricle has no regional wall motion abnormalities. The left ventricular internal cavity size was normal in size. There is  no left ventricular hypertrophy. Left ventricular diastolic parameters are consistent with Grade I diastolic dysfunction (impaired relaxation). Elevated left  ventricular end-diastolic pressure. Right Ventricle: The right ventricular size is normal. No increase in right ventricular wall thickness. Right ventricular systolic function is normal. There is normal pulmonary artery systolic pressure. The tricuspid regurgitant velocity is 1.50 m/s, and  with an assumed right atrial pressure of 8 mmHg, the estimated right ventricular systolic pressure is 01.3 mmHg. Left Atrium: Left atrial size was normal in size. Right Atrium: Right atrial size was normal in size. Pericardium: There is no evidence of pericardial effusion. Mitral Valve: The mitral valve is normal in structure. No evidence of mitral valve regurgitation. No evidence of mitral  valve stenosis. Tricuspid Valve: The tricuspid valve is normal in structure. Tricuspid valve regurgitation is trivial. No evidence of tricuspid stenosis. Aortic Valve: The aortic valve is normal in structure. There is mild calcification of the aortic valve. There is mild thickening of the aortic valve. Aortic valve regurgitation is not visualized. No aortic stenosis is present. Pulmonic Valve: The pulmonic valve was normal in structure. Pulmonic valve regurgitation is not visualized. No evidence of pulmonic stenosis. Aorta: Aortic dilatation noted. There is mild dilatation of the ascending aorta, measuring 37 mm. Venous: The inferior vena cava is dilated in size with greater than 50% respiratory variability, suggesting right atrial pressure of 8 mmHg. IAS/Shunts: No atrial level shunt detected by color flow Doppler.  LEFT VENTRICLE PLAX 2D LVIDd:         3.70 cm  Diastology LVIDs:         2.70 cm  LV e' medial:    5.66 cm/s LV PW:         1.30 cm  LV E/e' medial:  13.9 LV IVS:        1.15 cm  LV e' lateral:   7.29 cm/s LVOT diam:     2.00 cm  LV E/e' lateral: 10.8 LV SV:         64 LV SV Index:   29 LVOT Area:     3.14 cm  RIGHT VENTRICLE             IVC RV Basal diam:  2.30 cm     IVC diam: 2.30 cm RV S prime:     16.20 cm/s TAPSE (M-mode):  2.5 cm LEFT ATRIUM             Index       RIGHT ATRIUM           Index LA diam:        3.50 cm 1.56 cm/m  RA Area:     12.20 cm LA Vol (A2C):   61.6 ml 27.53 ml/m RA Volume:   24.90 ml  11.13 ml/m LA Vol (A4C):   36.0 ml 16.09 ml/m LA Biplane Vol: 49.1 ml 21.94 ml/m  AORTIC VALVE LVOT Vmax:   98.90 cm/s LVOT Vmean:  65.600 cm/s LVOT VTI:    0.205 m  AORTA Ao Root diam: 3.30 cm Ao Asc diam:  3.70 cm MITRAL VALVE                TRICUSPID VALVE MV Area (PHT): 2.56 cm     TR Peak grad:   9.0 mmHg MV Decel Time: 296 msec     TR Vmax:        150.00 cm/s MV E velocity: 78.50 cm/s MV A velocity: 111.00 cm/s  SHUNTS MV E/A ratio:  0.71         Systemic VTI:  0.20 m                             Systemic Diam: 2.00 cm Skeet Latch MD Electronically signed by Skeet Latch MD Signature Date/Time: 08/02/2020/11:55:42 AM    Final     Positive ROS: All other systems have been reviewed and were otherwise negative with the exception of those mentioned in the HPI and as above.  Objective: Labs cbc Recent Labs    08/02/20 1558  WBC 7.4  HGB 14.6  HCT 44.4  PLT 188    Labs inflam No results for input(s): CRP  in the last 72 hours.  Invalid input(s): ESR  Labs coag No results for input(s): INR, PTT in the last 72 hours.  Invalid input(s): PT  Recent Labs    08/02/20 1558  NA 138  K 4.3  CL 105  CO2 25  GLUCOSE 129*  BUN 16  CREATININE 1.38*  CALCIUM 8.9    Physical Exam: Vitals:   08/03/20 1001 08/03/20 1246  BP: (!) 144/79 (!) 153/98  Pulse: (!) 105 74  Resp: 16 18  Temp: 98.7 F (37.1 C) 98.7 F (37.1 C)  SpO2: 98% 98%   General: Alert, no acute distress.  Sitting up in chair. Mental status: Alert and Oriented x3 Neurologic: Speech Clear and organized, no gross focal findings or movement disorder appreciated. Respiratory: No cyanosis, no use of accessory musculature Cardiovascular: No pedal edema GI: Abdomen is soft and non-tender, non-distended. Skin: Warm and dry.   No lesions in the area of chief complaint . Extremities: Warm and well perfused w/o edema Psychiatric: Patient is competent for consent with normal mood and affect  MUSCULOSKELETAL:  RLE: there is some bruising and swelling around the medial side of the right knee.  There is some mild TTP to the medial joint line.  Other extremities are atraumatic with painless ROM and NV  Assessment / Plan: Active Problems:   ICH (intracerebral hemorrhage) (HCC)   Hemorrhagic stroke (HCC)   Memory loss   Dyslipidemia   Elevated blood pressure reading   AKI (acute kidney injury) ([place])   Primary osteoarthritis of right knee  Pt. May have a bone contusion or meniscus injury. Both can be treated on an outpatient basis.  Weightbearing: WBAT RLE Orthopedic device(s): Recommend knee brace for comfort. Pain control: Per primary team. Recommend NSAID's if possible. Follow - up plan: 2 weeks  Contact information:  Edmonia Lynch MD, Margy Clarks PA-C  Rachael Fee PA-C 08/03/2020 4:20 PM

## 2020-08-03 NOTE — Plan of Care (Signed)
  Problem: Education: Goal: Knowledge of secondary prevention will improve Outcome: Progressing   Problem: Education: Goal: Knowledge of disease or condition will improve Outcome: Progressing   Problem: Education: Goal: Knowledge of secondary prevention will improve Outcome: Progressing

## 2020-08-04 ENCOUNTER — Inpatient Hospital Stay (HOSPITAL_COMMUNITY): Payer: Medicare HMO

## 2020-08-04 LAB — COMPREHENSIVE METABOLIC PANEL
ALT: 22 U/L (ref 0–44)
AST: 21 U/L (ref 15–41)
Albumin: 2.9 g/dL — ABNORMAL LOW (ref 3.5–5.0)
Alkaline Phosphatase: 61 U/L (ref 38–126)
Anion gap: 12 (ref 5–15)
BUN: 13 mg/dL (ref 8–23)
CO2: 22 mmol/L (ref 22–32)
Calcium: 8.9 mg/dL (ref 8.9–10.3)
Chloride: 99 mmol/L (ref 98–111)
Creatinine, Ser: 1.27 mg/dL — ABNORMAL HIGH (ref 0.61–1.24)
GFR, Estimated: 58 mL/min — ABNORMAL LOW (ref >60)
Glucose, Bld: 215 mg/dL — ABNORMAL HIGH (ref 70–99)
Potassium: 3.7 mmol/L (ref 3.5–5.1)
Sodium: 133 mmol/L — ABNORMAL LOW (ref 135–145)
Total Bilirubin: 1.2 mg/dL (ref 0.3–1.2)
Total Protein: 6.4 g/dL — ABNORMAL LOW (ref 6.5–8.1)

## 2020-08-04 MED ORDER — ACETAMINOPHEN 325 MG PO TABS
650.0000 mg | ORAL_TABLET | Freq: Once | ORAL | Status: AC
  Administered 2020-08-04: 650 mg via ORAL
  Filled 2020-08-04: qty 2

## 2020-08-04 MED ORDER — MELOXICAM 7.5 MG PO TABS
7.5000 mg | ORAL_TABLET | Freq: Every day | ORAL | Status: DC
  Administered 2020-08-04 – 2020-08-06 (×3): 7.5 mg via ORAL
  Filled 2020-08-04 (×3): qty 1

## 2020-08-04 MED ORDER — PREDNISONE 5 MG PO TABS
10.0000 mg | ORAL_TABLET | Freq: Every day | ORAL | Status: DC
  Administered 2020-08-05 – 2020-08-06 (×2): 10 mg via ORAL
  Filled 2020-08-04 (×2): qty 2

## 2020-08-04 NOTE — Progress Notes (Signed)
STROKE TEAM PROGRESS NOTE   INTERVAL HISTORY Patient is sitting in bedside chair.   His son is at the bedside. He was seen by orthopedics yesterday who feel he may have bone contusion or meniscus injury  but recommend conservative therapy and no surgery for now recommend outpatient follow-up Vitals:   08/04/20 0035 08/04/20 0058 08/04/20 0200 08/04/20 0413  BP: (!) 154/87  (!) 161/76 (!) 148/79  Pulse: (!) 113 (!) 114 (!) 108 (!) 109  Resp: 18  20 20   Temp: (!) 100.5 F (38.1 C) 98.5 F (36.9 C) 98 F (36.7 C) 98 F (36.7 C)  TempSrc: Oral Oral Oral Oral  SpO2: 96% 96% 96% 98%  Weight:      Height:       CBC:  Recent Labs  Lab 07/31/20 1112 08/02/20 1558  WBC 5.6 7.4  NEUTROABS 3.6 4.9  HGB 14.9 14.6  HCT 46.3 44.4  MCV 91.7 90.1  PLT 185 102   Basic Metabolic Panel:  Recent Labs  Lab 07/31/20 1112 08/02/20 1558  NA 139 138  K 4.0 4.3  CL 108 105  CO2 20* 25  GLUCOSE 124* 129*  BUN 15 16  CREATININE 1.35* 1.38*  CALCIUM 8.9 8.9  Lipid Panel:  Recent Labs  Lab 08/01/20 0952  CHOL 123  TRIG 86  HDL 34*  CHOLHDL 3.6  VLDL 17  LDLCALC 72   HgbA1c:  Recent Labs  Lab 08/01/20 0952  HGBA1C 5.8*   Urine Drug Screen:  Recent Labs  Lab 08/01/20 0859  LABOPIA NONE DETECTED  COCAINSCRNUR NONE DETECTED  LABBENZ NONE DETECTED  AMPHETMU NONE DETECTED  THCU NONE DETECTED  LABBARB NONE DETECTED    Alcohol Level  Recent Labs  Lab 07/31/20 1112  ETH <10   IMAGING past 24 hours DG Knee 1-2 Views Right  Result Date: 08/03/2020 CLINICAL DATA:  80 year old male with history of trauma from a fall 5 days ago complaining of right anterior knee pain. EXAM: RIGHT KNEE - 1-2 VIEW COMPARISON:  06/21/2018. FINDINGS: No definite acute displaced fracture or dislocation. Severe joint space narrowing, subchondral sclerosis, subchondral cyst formation and osteophyte formation is again noted, most pronounced in the medial and patellofemoral compartments. Large  suprapatellar joint effusion. Vascular calcifications noted in the medial aspect of the right thigh. IMPRESSION: 1. No acute displaced fracture or dislocation. 2. Large suprapatellar joint effusion. 3. Severe tricompartmental osteoarthritis, most pronounced in the medial and patellofemoral compartments. Electronically Signed   By: Vinnie Langton M.D.   On: 08/03/2020 14:25      Physical Exam  Constitutional: Appears well-developed and well-nourished elderly obese Caucasian male.  Psych: Affect appropriate to situation Eyes: No scleral injection HENT: No OP obstrucion Head: Normocephalic.  Cardiovascular: Normal rate and regular rhythm.  Respiratory: Effort normal and breath sounds normal to anterior ascultation GI: Soft.  No distension. There is no tenderness.  Skin: WDI Right knee swelling and pain with some redness on the lateral aspect. Neuro: Mental Status: Patient is awake, alert, oriented to person, place, month, year, and situation. Patient is able to give a clear and coherent history. No signs of neglect. He has an increased latency of response.  Cranial Nerves: II: Visual Fields are full. Pupils are equal, round, and reactive to light.   III,IV, VI: EOMI without ptosis or diplopia.  V: Facial sensation is symmetric to temperature VII: Facial movement is symmetric.  VIII: hearing is intact to voice X: Uvula is midline and palate elevates symmetrically XI:  Shoulder shrug is symmetric. XII: tongue is midline without atrophy or fasciculations.  Motor: Tone is normal. Bulk is normal. 5/5 strength was present in all four extremities.  Diminished fine finger movements on the right.  Orbits left to right upper extremity.  Mild right grip weakness. Sensory: Sensation is symmetric to light touch and temperature in the arms and legs. Cerebellar: FNF intact bilaterally  ASSESSMENT/PLAN Mr. ROMUALDO PROSISE is a 80 y.o. male with history of memory loss, hearing loss, CKD stage 2,  HLD, gout, obesity and hypercholesterolemia who presents with frontal headache for several hours, vague feeling of not doing well, s/p unwitnessed fall on the day of admission with denied LOC. He presented to [facility] ED for evaluation at the insistence of his wife. NIHSS 0.   ICH: Large thalamic hemorrhage with associated vasogenic edema without mass effect likely related to uncontrolled previously unknown hypertension.  HOLD all antiplatelets or anticoagulants  Repeat HCT stat for decreased mental status or neurologic worsening  Code Stroke CT  shows thalamic hemorrhage   MRI: No significant interval change in size and morphology of acutehemorrhage centered at the left thalamus. Mild surrounding vasogenicedema without significant regional mass effect. Associatedintraventricular extension without hydrocephalus. Age-related cerebral atrophy with mild chronic small vessel ischemic disease.  Echo: Left ventricular ejection fraction, by estimation, is 55 to 60%.  LDL - 72   HgbA1c - 5.8  VTE prophylaxis -SCDs    Diet   Diet regular Room service appropriate? Yes with Assist; Fluid consistency: Thin    Therapy recommendations:  CIR recommended  Disposition:  Unclear discharge plan, Patient has declined CIR for now. Pending his making progress the plan is tentatively home with outpatient vs. home therapies. However, he must make progress for this to work.  Hospitalist team, Dr. Sloan Leiter, contacted to assume primary management upon transfer to floor.   Hypertension, previously unknown  Currently on Nicardipine drip  Norvasc 5mg  daily ; Labetalol prn  Blood pressure control with goal systolic 096 (SBP 045'W to 150's today) . Long-term BP goal normotensive  Hyperlipidemia  Home meds: Lipitor 40 mg on hold due to hemorrhage. May resume at discharge.   LDL 72, goal < 70  Other Stroke Risk Factors  Advanced Age >/= 87   History of smoking: reports that he quit smoking about 9 years ago.  His smoking use included cigars. He quit after 42.00 years of use. He has never used smokeless tobacco.   He reports that he does not drink alcohol and does not use drugs.  Obesity, Body mass index is 32.08 kg/m., BMI >/= 30 associated with increased stroke risk, recommend weight loss, diet and exercise as appropriate   Other Active Problems  Right knee pain and swelling.-Try local heat and ice pack.  Tylenol 3 every 6 hours as needed.   Hospital day # 4 Recommend continue close blood pressure monitoring and ongoing therapies.    Hopefully transfer to inpatient rehab over the next few days when bed available.  Long discussion with patient and son at the bedside and answered questions.  Stroke team will sign off.  Kindly call for questions greater than 50% time during this 25-minute visit was spent on counseling coordination of care) intracerebral pain answering questions. Antony Contras, MD  To contact Stroke Continuity provider, please refer to [email or web address] After hours, contact General Neurology

## 2020-08-04 NOTE — Progress Notes (Addendum)
PROGRESS NOTE  Kenneth Harper LPF:[id number] DOB: July 03, 1941 DOA: 07/31/2020 PCP: Wenda Low, MD  HPI/Recap of past 24 hours: Kenneth Harper is a 80 y.o. male with history of memory loss and hypercholesterolemia who presents with numbness that started yesterday.  States that around 1230 or one he was out of his house when he felt like he was unable to do for himself as he normally is, though is very vague about exactly what symptoms he felt.  He states that he now feels almost completely back to normal.   He was admitted to ICU by stroke team.  [facility] hospital[facility] has been consulted to take over management of patient's medical needs  Subjective August 04, 2020: Patient seen and examined at bedside.  His son Kenneth Harper is at bedside.  Wanted to know what the results of the x-ray showed  August 03, 2020 Patient seen and examined at bedside.  Nurse had reported patient was having pain to the right knee which made it difficult to walk.  Assessment/Plan: Active Problems:   ICH (intracerebral hemorrhage) (HCC)   Hemorrhagic stroke (HCC)   Memory loss   Dyslipidemia   Elevated blood pressure reading   AKI (acute kidney injury) ([place])   Primary osteoarthritis of right knee  #1 intracranial hemorrhage neurology consulted.  No antiplatelet or anticoagulation Neurology has signed off today they recommend inpatient rehab.  Consult for inpatient rehab ordered  2.  Hypertension we will allow permissive blood pressure due to history of intracranial hemorrhage.Continue amlodipine.  He was on Cardene drip yesterday but that has been discontinued  3.  Right knee pain.  This decreased his mobility and ability to participate in physical therapy today.  X-ray will be obtained, orthopedic consult will be obtained.  Continue physical therapy X-ray shows severe osteoarthritis and large knee effusion.  I spoke with orthopedic regarding the knee effusion, he advised conservative management and will see him as  outpatient he stated he just got some steroid injection about a month ago as outpatient.  Suggested adding steroid and a brace.Marland Kitchen  He will be started on meloxicam for arthritis and I will add 5-day course of prednisone and Ace wrap  4.  Acute kidney injury his current creatinine was 3.8 EGFR of 52.  I will recheck his creatinine has improved to 1.27 today  4.  Abnormal glucose level we will continue to monitor his last hemoglobin A1c was 5.8  Code Status: Full  Severity of Illness: The appropriate patient status for this patient is INPATIENT. Inpatient status is judged to be reasonable and necessary in order to provide the required intensity of service to ensure the patient's safety. The patient's presenting symptoms, physical exam findings, and initial radiographic and laboratory data in the context of their chronic comorbidities is felt to place them at high risk for further clinical deterioration. Furthermore, it is not anticipated that the patient will be medically stable for discharge from the hospital within 2 midnights of admission. The following factors support the patient status of inpatient.   " The patient's presenting symptoms include intracranial hemorrhage. " The worrisome physical exam findings include intracranial hemorrhage. " The initial radiographic and laboratory data are worrisome because of intracranial hemorrhage. " The chronic co-morbidities include hypertension.   * I certify that at the point of admission it is my clinical judgment that the patient will require inpatient hospital care spanning beyond 2 midnights from the point of admission due to high intensity of service, high risk for further  deterioration and high frequency of surveillance required.*    Family Communication: Discussed with patient and son Kenneth Harper at bedside  Disposition Plan: Home when stable   Consultants:  Neurology  Orthopedic  Procedures:  None  Antimicrobials:  None  DVT  prophylaxis: ICD   Objective: Vitals:   08/04/20 0200 08/04/20 0413 08/04/20 0600 08/04/20 0827  BP: (!) 161/76 (!) 148/79 (!) 149/96 (!) 147/88  Pulse: (!) 108 (!) 109 (!) 107 (!) 110  Resp: $Remo'20 20 18 18  'TahoK$ Temp: 98 F (36.7 C) 98 F (36.7 C) 100.2 F (37.9 C) 98.2 F (36.8 C)  TempSrc: Oral Oral Oral   SpO2: 96% 98% 97% 96%  Weight:      Height:        Intake/Output Summary (Last 24 hours) at 08/04/2020 9563 Last data filed at 08/04/2020 0600 Gross per 24 hour  Intake --  Output 900 ml  Net -900 ml   Filed Weights   07/31/20 1000  Weight: 104.3 kg   Body mass index is 32.08 kg/m.  Exam:   General: 80 y.o. year-old male well developed well nourished in no acute distress.  Alert and oriented x3.  Slightly overweight  Cardiovascular: Regular rate and rhythm with no rubs or gallops.  No thyromegaly or JVD noted.    Respiratory: Clear to auscultation with no wheezes or rales. Good inspiratory effort.  Abdomen: Soft nontender nondistended with normal bowel sounds x4 quadrants.  Musculoskeletal: No lower extremity edema. 2/4 pulses in all 4 extremities.  Skin: No ulcerative lesions noted or rashes,  Psychiatry: Mood is appropriate for condition and setting    Data Reviewed: CBC: Recent Labs  Lab 07/31/20 1112 08/02/20 1558  WBC 5.6 7.4  NEUTROABS 3.6 4.9  HGB 14.9 14.6  HCT 46.3 44.4  MCV 91.7 90.1  PLT 185 875   Basic Metabolic Panel: Recent Labs  Lab 07/31/20 1112 08/02/20 1558  NA 139 138  K 4.0 4.3  CL 108 105  CO2 20* 25  GLUCOSE 124* 129*  BUN 15 16  CREATININE 1.35* 1.38*  CALCIUM 8.9 8.9   GFR: Estimated Creatinine Clearance: 54.2 mL/min (A) (by C-G formula based on SCr of 1.38 mg/dL (H)). Liver Function Tests: Recent Labs  Lab 07/31/20 1112  AST 26  ALT 17  ALKPHOS 71  BILITOT 0.6  PROT 6.5  ALBUMIN 3.5   No results for input(s): LIPASE, AMYLASE in the last 168 hours. No results for input(s): AMMONIA in the last 168  hours. Coagulation Profile: Recent Labs  Lab 07/31/20 1112  INR 1.1   Cardiac Enzymes: No results for input(s): CKTOTAL, CKMB, CKMBINDEX, TROPONINI in the last 168 hours. BNP (last 3 results) No results for input(s): PROBNP in the last 8760 hours. HbA1C: Recent Labs    08/01/20 0952  HGBA1C 5.8*   CBG: No results for input(s): GLUCAP in the last 168 hours. Lipid Profile: Recent Labs    08/01/20 0952  CHOL 123  HDL 34*  LDLCALC 72  TRIG 86  CHOLHDL 3.6   Thyroid Function Tests: No results for input(s): TSH, T4TOTAL, FREET4, T3FREE, THYROIDAB in the last 72 hours. Anemia Panel: No results for input(s): VITAMINB12, FOLATE, FERRITIN, TIBC, IRON, RETICCTPCT in the last 72 hours. Urine analysis:    Component Value Date/Time   COLORURINE YELLOW 08/01/2020 0859   APPEARANCEUR CLEAR 08/01/2020 0859   LABSPEC 1.017 08/01/2020 0859   PHURINE 5.0 08/01/2020 [place] 08/01/2020 0859   HGBUR NEGATIVE 08/01/2020 0859  BILIRUBINUR NEGATIVE 08/01/2020 0859   KETONESUR 5 (A) 08/01/2020 0859   PROTEINUR 30 (A) 08/01/2020 0859   NITRITE NEGATIVE 08/01/2020 0859   LEUKOCYTESUR NEGATIVE 08/01/2020 0859   Sepsis Labs: $RemoveBefo'@[id number]'bSwvdoGeTnv$ (procalcitonin:4,lacticidven:4)  ) Recent Results (from the past 240 hour(s))  Resp Panel by RT-PCR (Flu A&B, Covid) Nasopharyngeal Swab     Status: None   Collection Time: 07/31/20 11:30 AM   Specimen: Nasopharyngeal Swab; Nasopharyngeal(NP) swabs in vial transport medium  Result Value Ref Range Status   SARS Coronavirus 2 by RT PCR NEGATIVE NEGATIVE Final    Comment: (NOTE) SARS-CoV-2 target nucleic acids are NOT DETECTED.  The SARS-CoV-2 RNA is generally detectable in upper respiratory specimens during the acute phase of infection. The lowest concentration of SARS-CoV-2 viral copies this assay can detect is 138 copies/mL. A negative result does not preclude SARS-Cov-2 infection and should not be used as the sole basis for treatment  or other patient management decisions. A negative result may occur with  improper specimen collection/handling, submission of specimen other than nasopharyngeal swab, presence of viral mutation(s) within the areas targeted by this assay, and inadequate number of viral copies(<138 copies/mL). A negative result must be combined with clinical observations, patient history, and epidemiological information. The expected result is Negative.  Fact Sheet for Patients:  EntrepreneurPulse.com.au  Fact Sheet for Healthcare Providers:  IncredibleEmployment.be  This test is no t yet approved or cleared by the [place] FDA and  has been authorized for detection and/or diagnosis of SARS-CoV-2 by FDA under an Emergency Use Authorization (EUA). This EUA will remain  in effect (meaning this test can be used) for the duration of the COVID-19 declaration under Section 564(b)(1) of the Act, 21 U.S.C.section 360bbb-3(b)(1), unless the authorization is terminated  or revoked sooner.       Influenza A by PCR NEGATIVE NEGATIVE Final   Influenza B by PCR NEGATIVE NEGATIVE Final    Comment: (NOTE) The Xpert Xpress SARS-CoV-2/FLU/RSV plus assay is intended as an aid in the diagnosis of influenza from Nasopharyngeal swab specimens and should not be used as a sole basis for treatment. Nasal washings and aspirates are unacceptable for Xpert Xpress SARS-CoV-2/FLU/RSV testing.  Fact Sheet for Patients: EntrepreneurPulse.com.au  Fact Sheet for Healthcare Providers: IncredibleEmployment.be  This test is not yet approved or cleared by the [place] FDA and has been authorized for detection and/or diagnosis of SARS-CoV-2 by FDA under an Emergency Use Authorization (EUA). This EUA will remain in effect (meaning this test can be used) for the duration of the COVID-19 declaration under Section 564(b)(1) of the Act, 21 U.S.C. section  360bbb-3(b)(1), unless the authorization is terminated or revoked.  Performed at [facility], [place] [address] [place], [place] [address]   MRSA PCR Screening     Status: None   Collection Time: 08/01/20 12:35 PM   Specimen: Nasal Mucosa; Nasopharyngeal  Result Value Ref Range Status   MRSA by PCR NEGATIVE NEGATIVE Final    Comment:        The GeneXpert MRSA Assay (FDA approved for NASAL specimens only), is one component of a comprehensive MRSA colonization surveillance program. It is not intended to diagnose MRSA infection nor to guide or monitor treatment for MRSA infections. Performed at [facility], [place] [address], [place], [place] [address]       Studies: DG Knee 1-2 Views Right  Result Date: 08/03/2020 CLINICAL DATA:  80 year old male with history of trauma from a fall 5 days ago complaining  of right anterior knee pain. EXAM: RIGHT KNEE - 1-2 VIEW COMPARISON:  06/21/2018. FINDINGS: No definite acute displaced fracture or dislocation. Severe joint space narrowing, subchondral sclerosis, subchondral cyst formation and osteophyte formation is again noted, most pronounced in the medial and patellofemoral compartments. Large suprapatellar joint effusion. Vascular calcifications noted in the medial aspect of the right thigh. IMPRESSION: 1. No acute displaced fracture or dislocation. 2. Large suprapatellar joint effusion. 3. Severe tricompartmental osteoarthritis, most pronounced in the medial and patellofemoral compartments. Electronically Signed   By: Vinnie Langton M.D.   On: 08/03/2020 14:25    Scheduled Meds:  amLODipine  5 mg Oral Daily   pantoprazole  40 mg Oral QHS   senna-docusate  1 tablet Oral BID    Continuous Infusions:  niCARDipine 2.5 mg/hr (08/02/20 0905)     LOS: 4 days     [name], MD [facility]  To reach me or the doctor on call, go to: [email or web address] Password [email or web address]  08/04/2020, 9:22 AM

## 2020-08-04 NOTE — Progress Notes (Addendum)
Inpatient Rehab Admissions:  Inpatient Rehab Consult received. CIR re-consulted; pt now agreeable to potential CIR admission.   I met with patient at the bedside for rehabilitation assessment and to discuss goals and expectations of an inpatient rehab admission. Patient was lethargic but able to answer questions.  Uncertain of reliability of answers. Pt gave permission to contact son, Elta [place].  Let message; awaiting return call.  Will continue to follow.   Addendum: was not able to leave message as voicemail was full.     Signed: Gayland Curry, [place], [place] Admissions Coordinator [phone]

## 2020-08-04 NOTE — Progress Notes (Signed)
   08/04/20 2000  Provider Notification  Provider Name/Title Dr Hal Hope  Date Provider Notified 08/04/20  Time Provider Notified 2004  Notification Type Page  Notification Reason Other (Comment) (pt had a temp of 101.4)  Response See new orders  Date of Provider Response 08/04/20  Time of Provider Response 2014

## 2020-08-05 NOTE — Progress Notes (Signed)
Physical Therapy Treatment Patient Details Name: Kenneth Harper MRN: [id number] DOB: 28-Feb-1941 Today's Date: 08/05/2020    History of Present Illness 80 yo male with onset of fall and memory changes for the events of the fall was brought to ED, found to have L thalamic acute hemorrhage of 5 cc volume, microhemorrhages L cerebellar vermis and subcortical R parieto-occipital areas, 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  Found to have right knee effusion. New HTN dx, has HLD.  PMHx:  OA knees, asthma, CTS, cataracts, diverticulitis, GERD, memory loss    PT Comments    Patient progressing slowly towards PT goals. Reports no right knee pain today despite right knee effusion; decreased swelling per pt report. Requires Mod A to stand from EOB with use of RW and Min A for gait training with pt drifting right and bumping into things in right environment. Tolerated there ex sitting in chair. Also noted to have impaired memory and orientation. Not sure why he is in the hospital and does not recall therapists working with them previously. Continue to recommend CIR as pt is a high fall risk. Will follow.    Follow Up Recommendations  CIR     Equipment Recommendations  Rolling walker with 5" wheels    Recommendations for Other Services       Precautions / Restrictions Precautions Precautions: Fall Required Braces or Orthoses: Other Brace Other Brace: ortho recommending right knee brace; per RN ordered but not arrived yet. Restrictions Weight Bearing Restrictions: No    Mobility  Bed Mobility Overal bed mobility: Needs Assistance Bed Mobility: Supine to Sit     Supine to sit: Min guard;HOB elevated     General bed mobility comments: Increased time/effort to get to EOB with use of rail.  Transfers Overall transfer level: Needs assistance Equipment used: Rolling walker (2 wheeled) Transfers: Sit to/from Stand Sit to Stand: Mod assist         General transfer  comment: Assist to power to standing with cues for hand placement/technique. Wide BoS initially. Transferred to chair post ambulation.  Ambulation/Gait Ambulation/Gait assistance: Min assist Gait Distance (Feet): 75 Feet Assistive device: Rolling walker (2 wheeled) Gait Pattern/deviations: Step-through pattern;Wide base of support;Decreased stride length;Trunk flexed;Staggering right Gait velocity: decr   General Gait Details: Slow, unsteady gait with pt drifting towards right bumping into things on right side; cues for RW proximity.   Stairs             Wheelchair Mobility    Modified Rankin (Stroke Patients Only) Modified Rankin (Stroke Patients Only) Pre-Morbid Rankin Score: No symptoms Modified Rankin: Moderately severe disability     Balance Overall balance assessment: Needs assistance Sitting-balance support: Feet supported;No upper extremity supported Sitting balance-Leahy Scale: Good     Standing balance support: During functional activity Standing balance-Leahy Scale: Poor Standing balance comment: Requires UE support. Able to perform marching in standing in RW.                            Cognition Arousal/Alertness: Awake/alert Behavior During Therapy: WFL for tasks assessed/performed Overall Cognitive Status: Impaired/Different from baseline Area of Impairment: Memory                     Memory: Decreased short-term memory   Safety/Judgement: Decreased awareness of safety;Decreased awareness of deficits Awareness: Emergent Problem Solving: Difficulty sequencing;Requires verbal cues;Requires tactile cues General Comments: Does not know why he is in the hospital, "  not sure why I am here." Does not recall therapists coming to work with him at all.      Exercises General Exercises - Lower Extremity Hip ABduction/ADduction: AROM;Both;10 reps;Seated Hip Flexion/Marching: AROM;Both;10 reps;Seated    General Comments         Pertinent Vitals/Pain Pain Assessment: Faces Faces Pain Scale: No hurt    Home Living                      Prior Function            PT Goals (current goals can now be found in the care plan section) Progress towards PT goals: Progressing toward goals    Frequency    Min 4X/week      PT Plan Current plan remains appropriate    Co-evaluation              AM-PAC PT "6 Clicks" Mobility   Outcome Measure  Help needed turning from your back to your side while in a flat bed without using bedrails?: None Help needed moving from lying on your back to sitting on the side of a flat bed without using bedrails?: A Little Help needed moving to and from a bed to a chair (including a wheelchair)?: A Little Help needed standing up from a chair using your arms (e.g., wheelchair or bedside chair)?: A Little Help needed to walk in hospital room?: A Little Help needed climbing 3-5 steps with a railing? : A Lot 6 Click Score: 18    End of Session Equipment Utilized During Treatment: Gait belt Activity Tolerance: Patient tolerated treatment well Patient left: in chair;with call bell/phone within reach;with nursing/sitter in room (tech in room) Nurse Communication: Mobility status PT Visit Diagnosis: Unsteadiness on feet (R26.81);Muscle weakness (generalized) (M62.81);History of falling (Z91.81);Hemiplegia and hemiparesis Hemiplegia - Right/Left: Right Hemiplegia - dominant/non-dominant: Dominant     Time: 1610-9604 PT Time Calculation (min) (ACUTE ONLY): 19 min  Charges:  $Gait Training: 8-22 mins                     Marisa Severin, PT, DPT Acute Rehabilitation Services Pager [phone] Office [place] 08/05/2020, 12:25 PM

## 2020-08-05 NOTE — Progress Notes (Addendum)
PROGRESS NOTE  Kenneth Harper IWP:[id number] DOB: 11/07/1940 DOA: 07/31/2020 PCP: Wenda Low, MD  HPI/Recap of past 24 hours: Kenneth Harper is a 80 y.o. male with history of memory loss and hypercholesterolemia who presents with numbness that started yesterday.  States that around 1230 or one he was out of his house when he felt like he was unable to do for himself as he normally is, though is very vague about exactly what symptoms he felt.  He states that he now feels almost completely back to normal.   He was admitted to ICU by stroke team.  [facility] has been consulted to take over management of patient's medical needs  Subjective: August 05, 2020 Examined at bedside.  Patient sitting in the chair at the side of the bed in recliner denies any new complain.  Patient still has swelling and pain on his right knee which showed DJD and effusion August 04, 2020: Patient seen and examined at bedside.  His son Kenneth Harper is at bedside.  Wanted to know what the results of the x-ray showed  August 03, 2020: Patient seen and examined at bedside.  Nurse had reported patient was having pain to the right knee which made it difficult to walk.  Assessment/Plan: Active Problems:   ICH (intracerebral hemorrhage) (HCC)   Hemorrhagic stroke (HCC)   Memory loss   Dyslipidemia   Elevated blood pressure reading   AKI (acute kidney injury) ([place])   Primary osteoarthritis of right knee  #1 intracranial hemorrhage neurology consulted.  No antiplatelet or anticoagulation Neurology has signed off today they recommend inpatient rehab.  Consult for inpatient rehab ordered  2.  Hypertension we will allow permissive blood pressure due to history of intracranial hemorrhage.Continue amlodipine.  He was on Cardene drip yesterday but that has been discontinued  3.  Right knee pain.  This decreased his mobility and ability to participate in physical therapy today.  X-ray will be obtained, orthopedic  consult will be obtained.  Continue physical therapy X-ray shows severe osteoarthritis and large knee effusion.  I spoke with orthopedic regarding the knee effusion, he advised conservative management and will see him as outpatient he stated he just got some steroid injection about a month ago as outpatient.  Suggested adding steroid and a brace.Marland Kitchen  He will be started on meloxicam for arthritis and I will add 5-day course of prednisone and Ace wrap  4.  Acute kidney injury his current creatinine was 3.8 EGFR of 52.  I will recheck his creatinine has improved to 1.27 today  4.  Abnormal glucose level we will continue to monitor his last hemoglobin A1c was 5.8  Code Status: Full  Severity of Illness: The appropriate patient status for this patient is INPATIENT. Inpatient status is judged to be reasonable and necessary in order to provide the required intensity of service to ensure the patient's safety. The patient's presenting symptoms, physical exam findings, and initial radiographic and laboratory data in the context of their chronic comorbidities is felt to place them at high risk for further clinical deterioration. Furthermore, it is not anticipated that the patient will be medically stable for discharge from the hospital within 2 midnights of admission. The following factors support the patient status of inpatient.   " The patient's presenting symptoms include intracranial hemorrhage. " The worrisome physical exam findings include intracranial hemorrhage. " The initial radiographic and laboratory data are worrisome because of intracranial hemorrhage. " The chronic co-morbidities include hypertension.   *  I certify that at the point of admission it is my clinical judgment that the patient will require inpatient hospital care spanning beyond 2 midnights from the point of admission due to high intensity of service, high risk for further deterioration and high frequency of surveillance required.*     Family Communication: Discussed with patient and son Kenneth Harper at bedside  Disposition Plan: Home when stable   Consultants:  Neurology  Orthopedic  Procedures:  None  Antimicrobials:  None  DVT prophylaxis: ICD   Objective: Vitals:   08/04/20 2147 08/05/20 0000 08/05/20 0400 08/05/20 0830  BP:  124/65 (!) 142/78 126/67  Pulse:  81 90 99  Resp:  $Remo'20 18 16  'kgjsK$ Temp: 98.9 F (37.2 C) 97.6 F (36.4 C) 97.7 F (36.5 C) 98.6 F (37 C)  TempSrc: Oral Axillary Oral Oral  SpO2:  95% 96% 95%  Weight:      Height:       No intake or output data in the 24 hours ending 08/05/20 0910 Filed Weights   07/31/20 1000  Weight: 104.3 kg   Body mass index is 32.08 kg/m.  Exam:  . General: 80 y.o. year-old male well developed well nourished in no acute distress.  Alert and oriented x3.  Slightly overweight . Cardiovascular: Regular rate and rhythm with no rubs or gallops.  No thyromegaly or JVD noted.   Marland Kitchen Respiratory: Clear to auscultation with no wheezes or rales. Good inspiratory effort. . Abdomen: Soft nontender nondistended with normal bowel sounds x4 quadrants. . Musculoskeletal: No lower extremity edema. 2/4 pulses in all 4 extremities. . Skin: No ulcerative lesions noted or rashes, . Psychiatry: Mood is appropriate for condition and setting    Data Reviewed: CBC: Recent Labs  Lab 07/31/20 1112 08/02/20 1558  WBC 5.6 7.4  NEUTROABS 3.6 4.9  HGB 14.9 14.6  HCT 46.3 44.4  MCV 91.7 90.1  PLT 185 294   Basic Metabolic Panel: Recent Labs  Lab 07/31/20 1112 08/02/20 1558 08/04/20 0952  NA 139 138 133*  K 4.0 4.3 3.7  CL 108 105 99  CO2 20* 25 22  GLUCOSE 124* 129* 215*  BUN $Re'15 16 13  'Vvj$ CREATININE 1.35* 1.38* 1.27*  CALCIUM 8.9 8.9 8.9   GFR: Estimated Creatinine Clearance: 58.9 mL/min (A) (by C-G formula based on SCr of 1.27 mg/dL (H)). Liver Function Tests: Recent Labs  Lab 07/31/20 1112 08/04/20 0952  AST 26 21  ALT 17 22  ALKPHOS 71 61  BILITOT  0.6 1.2  PROT 6.5 6.4*  ALBUMIN 3.5 2.9*   No results for input(s): LIPASE, AMYLASE in the last 168 hours. No results for input(s): AMMONIA in the last 168 hours. Coagulation Profile: Recent Labs  Lab 07/31/20 1112  INR 1.1   Cardiac Enzymes: No results for input(s): CKTOTAL, CKMB, CKMBINDEX, TROPONINI in the last 168 hours. BNP (last 3 results) No results for input(s): PROBNP in the last 8760 hours. HbA1C: No results for input(s): HGBA1C in the last 72 hours. CBG: No results for input(s): GLUCAP in the last 168 hours. Lipid Profile: No results for input(s): CHOL, HDL, LDLCALC, TRIG, CHOLHDL, LDLDIRECT in the last 72 hours. Thyroid Function Tests: No results for input(s): TSH, T4TOTAL, FREET4, T3FREE, THYROIDAB in the last 72 hours. Anemia Panel: No results for input(s): VITAMINB12, FOLATE, FERRITIN, TIBC, IRON, RETICCTPCT in the last 72 hours. Urine analysis:    Component Value Date/Time   COLORURINE YELLOW 08/01/2020 0859   APPEARANCEUR CLEAR 08/01/2020 0859   LABSPEC 1.017  08/01/2020 0859   PHURINE 5.0 08/01/2020 [place] 08/01/2020 [place] 08/01/2020 [place] 08/01/2020 0859   KETONESUR 5 (A) 08/01/2020 0859   PROTEINUR 30 (A) 08/01/2020 0859   NITRITE NEGATIVE 08/01/2020 0859   LEUKOCYTESUR NEGATIVE 08/01/2020 0859   Sepsis Labs: $RemoveBefo'@[id number]'vYpYklpGFot$ (procalcitonin:4,lacticidven:4)  ) Recent Results (from the past 240 hour(s))  Resp Panel by RT-PCR (Flu A&B, Covid) Nasopharyngeal Swab     Status: None   Collection Time: 07/31/20 11:30 AM   Specimen: Nasopharyngeal Swab; Nasopharyngeal(NP) swabs in vial transport medium  Result Value Ref Range Status   SARS Coronavirus 2 by RT PCR NEGATIVE NEGATIVE Final    Comment: (NOTE) SARS-CoV-2 target nucleic acids are NOT DETECTED.  The SARS-CoV-2 RNA is generally detectable in upper respiratory specimens during the acute phase of infection. The lowest concentration of SARS-CoV-2  viral copies this assay can detect is 138 copies/mL. A negative result does not preclude SARS-Cov-2 infection and should not be used as the sole basis for treatment or other patient management decisions. A negative result may occur with  improper specimen collection/handling, submission of specimen other than nasopharyngeal swab, presence of viral mutation(s) within the areas targeted by this assay, and inadequate number of viral copies(<138 copies/mL). A negative result must be combined with clinical observations, patient history, and epidemiological information. The expected result is Negative.  Fact Sheet for Patients:  EntrepreneurPulse.com.au  Fact Sheet for Healthcare Providers:  IncredibleEmployment.be  This test is no t yet approved or cleared by the [place] FDA and  has been authorized for detection and/or diagnosis of SARS-CoV-2 by FDA under an Emergency Use Authorization (EUA). This EUA will remain  in effect (meaning this test can be used) for the duration of the COVID-19 declaration under Section 564(b)(1) of the Act, 21 U.S.C.section 360bbb-3(b)(1), unless the authorization is terminated  or revoked sooner.       Influenza A by PCR NEGATIVE NEGATIVE Final   Influenza B by PCR NEGATIVE NEGATIVE Final    Comment: (NOTE) The Xpert Xpress SARS-CoV-2/FLU/RSV plus assay is intended as an aid in the diagnosis of influenza from Nasopharyngeal swab specimens and should not be used as a sole basis for treatment. Nasal washings and aspirates are unacceptable for Xpert Xpress SARS-CoV-2/FLU/RSV testing.  Fact Sheet for Patients: EntrepreneurPulse.com.au  Fact Sheet for Healthcare Providers: IncredibleEmployment.be  This test is not yet approved or cleared by the [place] FDA and has been authorized for detection and/or diagnosis of SARS-CoV-2 by FDA under an Emergency Use Authorization  (EUA). This EUA will remain in effect (meaning this test can be used) for the duration of the COVID-19 declaration under Section 564(b)(1) of the Act, 21 U.S.C. section 360bbb-3(b)(1), unless the authorization is terminated or revoked.  Performed at [facility], [place] [address], [place], [place] [address]   MRSA PCR Screening     Status: None   Collection Time: 08/01/20 12:35 PM   Specimen: Nasal Mucosa; Nasopharyngeal  Result Value Ref Range Status   MRSA by PCR NEGATIVE NEGATIVE Final    Comment:        The GeneXpert MRSA Assay (FDA approved for NASAL specimens only), is one component of a comprehensive MRSA colonization surveillance program. It is not intended to diagnose MRSA infection nor to guide or monitor treatment for MRSA infections. Performed at [facility], [place] [address] [place],  [address]       Studies: DG CHEST PORT  1 VIEW  Result Date: 08/04/2020 CLINICAL DATA:  Headache and fever EXAM: PORTABLE CHEST 1 VIEW COMPARISON:  04/05/2018 FINDINGS: The heart size and mediastinal contours are within normal limits. Both lungs are clear. The visualized skeletal structures are unremarkable. IMPRESSION: No active disease. Electronically Signed   By: Constance Holster M.D.   On: 08/04/2020 20:41    Scheduled Meds: . amLODipine  5 mg Oral Daily  . meloxicam  7.5 mg Oral Daily  . pantoprazole  40 mg Oral QHS  . predniSONE  10 mg Oral Q breakfast  . senna-docusate  1 tablet Oral BID    Continuous Infusions: . niCARDipine 2.5 mg/hr (08/02/20 0905)     LOS: 5 days     [name], MD [facility]  To reach me or the doctor on call, go to: [email or web address] Password [email or web address]  08/05/2020, 9:10 AM

## 2020-08-05 NOTE — PMR Pre-admission (Signed)
PMR Admission Coordinator Pre-Admission Assessment  Patient: Kenneth Harper is an 80 y.o., male MRN: [id number] DOB: May 05, 1941 Height: 5\' 11"  (180.3 cm) Weight: 104.3 kg              Insurance Information HMO: yes    PPO:      PCP:      IPA:      80/20:      OTHER:  PRIMARY: [employer]       Policy#: [id number]      Subscriber: patient CM Name: Kenneth Harper      Phone#: [phone] ext [phone]     Fax#: [phone] Pre-Cert#: [id number]      Employer: n/a Benefits:  Phone #: n/a-online at [email or web address]     Name: n/a Eff. Date: 07/14/2019-present     Deduct: does not have for in-network providers      Out of Pocket Max: $3,900 ($20 met)      Life Max: NA  CIR: $295/day co-pay for days 1-6, $0/day co-pay for days 7-90      SNF: $0/day co-pay for days 1-20, $188/day co-pay for days 21-100; limited to 100 days/cal yr Outpatient: $10-$20/visit co-pay pending service; visits limited by medical necessity     Co-Pay:  Home Health: 100% coverage, 0% co-insurance; limited by medical necessity      Co-Pay:  DME: 80% coverage     Co-Pay: 20% Providers: in-network SECONDARY:       Policy#:       Phone#:   Development worker, community:       Phone#:   The Engineer, [employer]" for patients in Inpatient Rehabilitation Facilities with attached "Privacy Act Staples Records" was provided and verbally reviewed with: Patient  Emergency Contact Information Contact Information    Name Relation Home Work Kenneth Harper Daughter   [phone]   Harper, Kenneth [phone]  [phone]   Harper, Kenneth [phone]  [phone]     Current Medical History  Patient Admitting Diagnosis: L thalamic hemorrhage  History of Present Illness: Pt is a 80 y.o. right-handed male with history of memory loss, hyperlipidemia, quit smoking 9 years ago.  History taken from chart review and patient.  Son present.  Patient lives with spouse independent with assistive device.  1  level home.  Family assistance as needed.  He presented on 08/01/2020 with AMS and headaches after an unwitnessed fall.  Denies LOC.  CT/MRI showed acute 6 cm left thalamic hematoma.  Small volume intraventricular extension without hydrocephalus.  Admission chemistries unremarkable except glucose 1.4, creatinine 1.35, alcohol negative urine drug screen negative.  Echocardiogram with ejection fraction of 55-60%.  No wall motion abnormalities.  Neurology recommending conservative care for ICH.  Maintain on a regular diet.  Due to patient decreased functional ability due to cognitive changes, physical medicine rehab consult was requested.  Complete NIHSS TOTAL: 2 Glasgow Coma Scale Score: 15  Past Medical History  Past Medical History:  Diagnosis Date  . Arthritis    knees and back  . Asthma    childhood  . Carpal tunnel syndrome   . Cataract   . Diverticulitis   . GERD (gastroesophageal reflux disease)   . Hypercholesterolemia   . Memory loss     Family History  family history includes Cancer in his brother; Diabetes in his brother and brother; Heart failure in his father; Hydrocephalus in his brother; Migraines in his sister; Parkinson's disease in his mother.  Prior Rehab/Hospitalizations:  Has the patient had prior  rehab or hospitalizations prior to admission? No  Has the patient had major surgery during 100 days prior to admission? No  Current Medications   Current Facility-Administered Medications:  .  acetaminophen (TYLENOL) tablet 650 mg, 650 mg, Oral, Q4H PRN, 650 mg at 08/02/20 1016 **OR** acetaminophen (TYLENOL) 160 MG/5ML solution 650 mg, 650 mg, Per Tube, Q4H PRN **OR** acetaminophen (TYLENOL) suppository 650 mg, 650 mg, Rectal, Q4H PRN, [name], MD .  acetaminophen-codeine (TYLENOL #3) 300-30 MG per tablet 2 tablet, 2 tablet, Oral, Q6H PRN, [name], MD .  amLODipine (NORVASC) tablet 5 mg, 5 mg, Oral, Daily, Bailey-Modzik, [name], NP, 5 mg at  08/05/20 0815 .  labetalol (NORMODYNE) injection 10 mg, 10 mg, Intravenous, Q2H PRN, Leonie Man, Pramod S, MD .  meloxicam (MOBIC) tablet 7.5 mg, 7.5 mg, Oral, Daily, [name], MD, 7.5 mg at 08/05/20 0816 .  nicardipine (CARDENE) 20mg  in 0.86% saline 256ml IV infusion (0.1 mg/ml), 3-15 mg/hr, Intravenous, Continuous, [name], MD, Last Rate: 25 mL/hr at 08/02/20 0905, 2.5 mg/hr at 08/02/20 0905 .  pantoprazole (PROTONIX) EC tablet 40 mg, 40 mg, Oral, QHS, [name], MD, 40 mg at 08/04/20 2145 .  predniSONE (DELTASONE) tablet 10 mg, 10 mg, Oral, Q breakfast, [name], MD, 10 mg at 08/05/20 0815 .  senna-docusate (Senokot-S) tablet 1 tablet, 1 tablet, Oral, BID, [name], MD, 1 tablet at 08/03/20 2138  Patients Current Diet:  Diet Order            Diet regular Room service appropriate? Yes with Assist; Fluid consistency: Thin  Diet effective now                 Precautions / Restrictions Precautions Precautions: Fall Precaution Comments: monitor vitals Other Brace: ortho recommending right knee brace; per RN ordered but not arrived yet. Restrictions Weight Bearing Restrictions: No   Has the patient had 2 or more falls or a fall with injury in the past year?No  Prior Activity Level Community (5-7x/wk): driving, working full-time  Prior Functional Level Prior Function Level of Independence: Independent Comments: per pt was working before this [place]: Did the patient need help bathing, dressing, using the toilet or eating?  Independent  Indoor Mobility: Did the patient need assistance with walking from room to room (with or without device)? Independent  Stairs: Did the patient need assistance with internal or external stairs (with or without device)? Independent  Functional Cognition: Did the patient need help planning regular tasks such as shopping or remembering to take medications? Independent  Home Assistive Devices /  Equipment Home Assistive Devices/Equipment: None Home Equipment: Cane - single point  Prior Device Use: Indicate devices/aids used by the patient prior to current illness, exacerbation or injury? None of the above  Current Functional Level Cognition  Arousal/Alertness: Awake/alert Overall Cognitive Status: Impaired/Different from baseline Orientation Level: Oriented to person Safety/Judgement: Decreased awareness of safety,Decreased awareness of deficits General Comments: Does not know why he is in the hospital, "not sure why I am here." Does not recall therapists coming to work with him at all. Attention: Focused,Sustained,Alternating Focused Attention: Appears intact Sustained Attention: Appears intact Alternating Attention: Appears intact Memory: Impaired Memory Impairment: Storage deficit,Retrieval deficit Awareness: Impaired Awareness Impairment: Anticipatory impairment Problem Solving: Impaired Problem Solving Impairment: Verbal complex,Functional complex Executive Function: Reasoning,Sequencing Safety/Judgment: Appears intact    Extremity Assessment (includes Sensation/Coordination)  Upper Extremity Assessment: Overall WFL for tasks assessed  Lower Extremity Assessment: Defer to  PT evaluation RLE Deficits / Details: R hip 4+ strength, knee and ankle 4+    ADLs  Overall ADL's : Needs assistance/impaired Grooming: Wash/dry face,Oral care,Standing,Sitting,Minimal assistance Grooming Details (indicate cue type and reason): MIN A for standing balance with RW to complete oral care at sink Upper Body Bathing: Moderate assistance,Sitting Upper Body Bathing Details (indicate cue type and reason): UB bathing seated at sink Lower Body Bathing: Moderate assistance,Sit to/from stand Lower Body Bathing Details (indicate cue type and reason): MOD A for LB bathing standing at sink, MOD A to stand Upper Body Dressing : Minimal assistance,Sitting Upper Body Dressing Details (indicate  cue type and reason): to don new gown Lower Body Dressing: Moderate assistance,Sit to/from stand Lower Body Dressing Details (indicate cue type and reason): assist to pull pants up to waist line Toilet Transfer: Minimal assistance,RW,Ambulation,Regular Toilet,Moderate assistance,Cueing for safety,Grab bars Toilet Transfer Details (indicate cue type and reason): MIN A to ambulate into toilet, MOD A to power up from low toilet seat, cues for hand placement on grab bars Toileting- Clothing Manipulation and Hygiene: Sit to/from stand,Moderate assistance,Minimal assistance Toileting - Clothing Manipulation Details (indicate cue type and reason): pt able to complete posterior pericare post BM but required MIN- MOD A for standing balance during dynamic tasks Functional mobility during ADLs: Minimal assistance,Moderate assistance,Rolling walker General ADL Comments: pt much more willing to use RW for ambulation, in comparision to previous OT session. Pt able to complete toileting, UB/LB ADLs at sink and functional mobility with up to [facility]. pt limited by decreased activity tolerance and increased WOB with activity tolerance however SpO2 WFL, HR increased to 123 bpm with mobility and ADL tasks    Mobility  Overal bed mobility: Needs Assistance Bed Mobility: Supine to Sit Supine to sit: Min guard,HOB elevated General bed mobility comments: Increased time/effort to get to EOB with use of rail.    Transfers  Overall transfer level: Needs assistance Equipment used: Rolling walker (2 wheeled) Transfers: Sit to/from Stand Sit to Stand: Mod assist General transfer comment: Assist to power to standing with cues for hand placement/technique. Wide BoS initially. Transferred to chair post ambulation.    Ambulation / Gait / Stairs / Wheelchair Mobility  Ambulation/Gait Ambulation/Gait assistance: Herbalist (Feet): 75 Feet Assistive device: Rolling walker (2 wheeled) Gait Pattern/deviations:  Step-through pattern,Wide base of support,Decreased stride length,Trunk flexed,Staggering right General Gait Details: Slow, unsteady gait with pt drifting towards right bumping into things on right side; cues for RW proximity. Gait velocity: decr    Posture / Balance Balance Overall balance assessment: Needs assistance Sitting-balance support: Feet supported,No upper extremity supported Sitting balance-Leahy Scale: Good Standing balance support: During functional activity Standing balance-Leahy Scale: Poor Standing balance comment: Requires UE support. Able to perform marching in standing in RW.    Special needs/care consideration  Skin ecchymosis: right knee, Bowel and Bladder incontinence and Designated visitor Jasean Ambrosia, son     Previous Home Environment (from acute therapy documentation) Living Arrangements: Spouse/significant other  Lives With: Spouse Available Help at Discharge: Family,Available 24 hours/day,Available PRN/intermittently (wife present 24/7, son and daughter present PRN) Type of Home: [place]: Two level,Able to live on main level with bedroom/bathroom Home Access: Stairs to enter Entrance Stairs-Rails: None Entrance Stairs-Number of Steps: 3 Bathroom Toilet: Standard Bathroom Accessibility: Yes How Accessible: Accessible via walker [place]: No Additional Comments: 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 wife does not require physical (A) but is unable to manage  more than supervision care. Son  Discharge Living Setting Plans for Discharge Living Setting: Patient's home Type of Home at Discharge: House Discharge Home Layout: Two level,Able to live on main level with bedroom/bathroom Discharge Home Access: Stairs to enter Entrance Stairs-Rails: None Entrance Stairs-Number of Steps: 3 Discharge Bathroom Toilet: Standard Discharge Bathroom Accessibility: Yes How Accessible: Accessible via walker Does  the patient have any problems obtaining your medications?: No  Social/Family/Support Systems Patient Roles: Spouse,Parent Anticipated Caregiver: Harrie Cazarez, son; Jacqlyn Larsen, daughter; wife Anticipated Caregiver's Contact Information: Elta Guadeloupe: [phone] Caregiver Availability: 24/7 (wife available 24/7; son and daughter are available PRN) Discharge Plan Discussed with Primary Caregiver: Yes Is Caregiver In Agreement with Plan?: Yes Does Caregiver/Family have Issues with Lodging/Transportation while Pt is in Rehab?: No   Goals Patient/Family Goal for Rehab: Mod I-Supervision: OT/PT. Mod I-ST Expected length of stay: 4-6 days Pt/Family Agrees to Admission and willing to participate: Yes Program Orientation Provided & Reviewed with Pt/Caregiver Including Roles  & Responsibilities: Yes   Decrease burden of Care through IP rehab admission: NA   Possible need for SNF placement upon discharge:NA   Patient Condition: This patient's medical and functional status has changed since the consult dated: 08/02/2020 in which the Rehabilitation Physician determined and documented that the patient's condition is appropriate for intensive rehabilitative care in an inpatient rehabilitation facility. See "History of Present Illness" (above) for medical update. Functional changes are: Pt. Now min A to Min guard with ADLs, transfers, and gait. Patient's medical and functional status update has been discussed with the Rehabilitation physician and patient remains appropriate for inpatient rehabilitation. Will admit to inpatient rehab today.  Preadmission Screen Completed By:  Bethel Born, CCC-SLP, 08/05/2020 4:11 PM ______________________________________________________________________   Discussed status with Dr. Dagoberto Ligas on at 08/06/2020 and received approval for admission today.  Admission Coordinator:  Bethel Born, time 13:30 Sudie Grumbling 08/06/2020 with updates by Clemens Catholic

## 2020-08-05 NOTE — Progress Notes (Signed)
Inpatient Rehab Admissions Coordinator:  Spoke with son, Elta Guadeloupe. Explained CIR goals and expectations. He acknowledged understanding. He is interested in pt pursuing CIR.  He indicated that family will be able to provide assistance for pt after discharge.  Will continue to follow.   Gayland Curry, [place], [place] Admissions Coordinator [phone]

## 2020-08-05 NOTE — Progress Notes (Signed)
Inpatient Rehab Admissions Coordinator:  Attempted to contact pt's son, Elta Guadeloupe. Unable to leave message as voicemail was full.  Will f/u at later date.   Gayland Curry, [place], [place] Admissions Coordinator [phone]

## 2020-08-05 NOTE — Progress Notes (Signed)
OT Cancellation Note  Patient Details Name: Kenneth Harper MRN: [id number] DOB: [birth year]   Cancelled Treatment:    Reason Eval/Treat Not Completed: Other (comment) patient met seated in recliner just after delivery of meal tray. Patient requesting OT return at a later time. 2nd attempt made and patient on the phone asking OT to return after 45 min. OT to check back as time allows.   Gloris Manchester OTR/L Supplemental OT, Department of rehab services [phone]  Kenneth Turay R H. 08/05/2020, 12:56 PM

## 2020-08-06 ENCOUNTER — Other Ambulatory Visit: Payer: Self-pay

## 2020-08-06 ENCOUNTER — Encounter (HOSPITAL_COMMUNITY): Payer: Self-pay | Admitting: Physical Medicine & Rehabilitation

## 2020-08-06 ENCOUNTER — Inpatient Hospital Stay (HOSPITAL_COMMUNITY)
Admission: RE | Admit: 2020-08-06 | Discharge: 2020-08-13 | DRG: 057 | Disposition: A | Discharge status: Home Health | Payer: Medicare HMO | Source: Intra-hospital | Attending: Physical Medicine & Rehabilitation | Admitting: Physical Medicine & Rehabilitation

## 2020-08-06 DIAGNOSIS — R7401 Elevation of levels of liver transaminase levels: Secondary | ICD-10-CM | POA: Diagnosis present

## 2020-08-06 DIAGNOSIS — T380X5A Adverse effect of glucocorticoids and synthetic analogues, initial encounter: Secondary | ICD-10-CM | POA: Diagnosis not present

## 2020-08-06 DIAGNOSIS — R0989 Other specified symptoms and signs involving the circulatory and respiratory systems: Secondary | ICD-10-CM

## 2020-08-06 DIAGNOSIS — W19XXXD Unspecified fall, subsequent encounter: Secondary | ICD-10-CM | POA: Diagnosis present

## 2020-08-06 DIAGNOSIS — R7303 Prediabetes: Secondary | ICD-10-CM

## 2020-08-06 DIAGNOSIS — I69151 Hemiplegia and hemiparesis following nontraumatic intracerebral hemorrhage affecting right dominant side: Principal | ICD-10-CM

## 2020-08-06 DIAGNOSIS — E871 Hypo-osmolality and hyponatremia: Secondary | ICD-10-CM | POA: Diagnosis present

## 2020-08-06 DIAGNOSIS — I619 Nontraumatic intracerebral hemorrhage, unspecified: Secondary | ICD-10-CM | POA: Diagnosis not present

## 2020-08-06 DIAGNOSIS — E876 Hypokalemia: Secondary | ICD-10-CM | POA: Diagnosis not present

## 2020-08-06 DIAGNOSIS — S8001XA Contusion of right knee, initial encounter: Secondary | ICD-10-CM

## 2020-08-06 DIAGNOSIS — R739 Hyperglycemia, unspecified: Secondary | ICD-10-CM | POA: Diagnosis not present

## 2020-08-06 DIAGNOSIS — N182 Chronic kidney disease, stage 2 (mild): Secondary | ICD-10-CM | POA: Diagnosis present

## 2020-08-06 DIAGNOSIS — R350 Frequency of micturition: Secondary | ICD-10-CM | POA: Diagnosis not present

## 2020-08-06 DIAGNOSIS — S8001XD Contusion of right knee, subsequent encounter: Secondary | ICD-10-CM | POA: Diagnosis not present

## 2020-08-06 DIAGNOSIS — I129 Hypertensive chronic kidney disease with stage 1 through stage 4 chronic kidney disease, or unspecified chronic kidney disease: Secondary | ICD-10-CM | POA: Diagnosis not present

## 2020-08-06 DIAGNOSIS — I10 Essential (primary) hypertension: Secondary | ICD-10-CM

## 2020-08-06 DIAGNOSIS — M1711 Unilateral primary osteoarthritis, right knee: Secondary | ICD-10-CM | POA: Diagnosis not present

## 2020-08-06 MED ORDER — PANTOPRAZOLE SODIUM 40 MG PO TBEC: 40.0000 mg | DELAYED_RELEASE_TABLET | Freq: Every day | ORAL | 0 refills | Status: DC

## 2020-08-06 MED ORDER — DICLOFENAC SODIUM 1 % EX GEL
2.0000 g | Freq: Four times a day (QID) | CUTANEOUS | Status: DC
  Administered 2020-08-07 – 2020-08-13 (×20): 2 g via TOPICAL
  Filled 2020-08-06: qty 100

## 2020-08-06 MED ORDER — AMLODIPINE BESYLATE 5 MG PO TABS
5.0000 mg | ORAL_TABLET | Freq: Every day | ORAL | Status: DC
  Administered 2020-08-07 – 2020-08-13 (×7): 5 mg via ORAL
  Filled 2020-08-06 (×7): qty 1

## 2020-08-06 MED ORDER — PROCHLORPERAZINE MALEATE 5 MG PO TABS: 5.0000 mg | ORAL_TABLET | Freq: Four times a day (QID) | ORAL | Status: DC | PRN

## 2020-08-06 MED ORDER — PROCHLORPERAZINE EDISYLATE 10 MG/2ML IJ SOLN: 5.0000 mg | Freq: Four times a day (QID) | INTRAMUSCULAR | Status: DC | PRN

## 2020-08-06 MED ORDER — ATORVASTATIN CALCIUM 40 MG PO TABS
40.0000 mg | ORAL_TABLET | Freq: Every day | ORAL | Status: DC
  Administered 2020-08-06 – 2020-08-07 (×2): 40 mg via ORAL
  Filled 2020-08-06: qty 1

## 2020-08-06 MED ORDER — BISACODYL 10 MG RE SUPP: 10.0000 mg | Freq: Every day | RECTAL | Status: DC | PRN

## 2020-08-06 MED ORDER — MELOXICAM 7.5 MG PO TABS: 7.5000 mg | ORAL_TABLET | Freq: Every day | ORAL | 0 refills | Status: DC

## 2020-08-06 MED ORDER — PROCHLORPERAZINE 25 MG RE SUPP: 12.5000 mg | Freq: Four times a day (QID) | RECTAL | Status: DC | PRN

## 2020-08-06 MED ORDER — SENNOSIDES-DOCUSATE SODIUM 8.6-50 MG PO TABS
1.0000 | ORAL_TABLET | Freq: Two times a day (BID) | ORAL | Status: DC
  Administered 2020-08-06 – 2020-08-13 (×13): 1 via ORAL
  Filled 2020-08-06 (×14): qty 1

## 2020-08-06 MED ORDER — DIPHENHYDRAMINE HCL 12.5 MG/5ML PO ELIX: 12.5000 mg | ORAL_SOLUTION | Freq: Four times a day (QID) | ORAL | Status: DC | PRN

## 2020-08-06 MED ORDER — ACETAMINOPHEN-CODEINE #3 300-30 MG PO TABS: 2.0000 | ORAL_TABLET | Freq: Four times a day (QID) | ORAL | Status: DC | PRN

## 2020-08-06 MED ORDER — SENNOSIDES-DOCUSATE SODIUM 8.6-50 MG PO TABS: 1.0000 | ORAL_TABLET | Freq: Two times a day (BID) | ORAL | 0 refills | Status: DC

## 2020-08-06 MED ORDER — TRAZODONE HCL 50 MG PO TABS
25.0000 mg | ORAL_TABLET | Freq: Every evening | ORAL | Status: DC | PRN
  Filled 2020-08-06: qty 1

## 2020-08-06 MED ORDER — ACETAMINOPHEN 325 MG PO TABS
325.0000 mg | ORAL_TABLET | ORAL | Status: DC | PRN
  Administered 2020-08-06 – 2020-08-07 (×3): 650 mg via ORAL
  Filled 2020-08-06 (×3): qty 2

## 2020-08-06 MED ORDER — PREDNISONE 5 MG PO TABS
10.0000 mg | ORAL_TABLET | Freq: Every day | ORAL | Status: AC
  Administered 2020-08-07 – 2020-08-09 (×3): 10 mg via ORAL
  Filled 2020-08-06 (×3): qty 2

## 2020-08-06 MED ORDER — GUAIFENESIN-DM 100-10 MG/5ML PO SYRP: 5.0000 mL | ORAL_SOLUTION | Freq: Four times a day (QID) | ORAL | Status: DC | PRN

## 2020-08-06 MED ORDER — PANTOPRAZOLE SODIUM 40 MG PO TBEC
40.0000 mg | DELAYED_RELEASE_TABLET | Freq: Every day | ORAL | Status: DC
  Administered 2020-08-06 – 2020-08-12 (×7): 40 mg via ORAL
  Filled 2020-08-06 (×6): qty 1

## 2020-08-06 MED ORDER — AMLODIPINE BESYLATE 5 MG PO TABS: 5.0000 mg | ORAL_TABLET | Freq: Every day | ORAL | 0 refills | Status: DC

## 2020-08-06 MED ORDER — POLYETHYLENE GLYCOL 3350 17 G PO PACK: 17.0000 g | PACK | Freq: Every day | ORAL | Status: DC | PRN

## 2020-08-06 MED ORDER — PREDNISONE 10 MG PO TABS: 10.0000 mg | ORAL_TABLET | Freq: Every day | ORAL | 0 refills | Status: DC

## 2020-08-06 MED ORDER — FLEET ENEMA 7-19 GM/118ML RE ENEM: 1.0000 | ENEMA | Freq: Once | RECTAL | Status: DC | PRN

## 2020-08-06 MED ORDER — ALUM & MAG HYDROXIDE-SIMETH 200-200-20 MG/5ML PO SUSP: 30.0000 mL | ORAL | Status: DC | PRN

## 2020-08-06 NOTE — Progress Notes (Signed)
Occupational Therapy Treatment Patient Details Name: Kenneth Harper MRN: [id number] DOB: 07-Jan-1941 Today's Date: 08/06/2020    History of present illness 80 yo male with onset of fall and memory changes for the events of the fall was brought to ED, found to have L thalamic acute hemorrhage of 5 cc volume, microhemorrhages L cerebellar vermis and subcortical R parieto-occipital areas, 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  Found to have right knee effusion. New HTN dx, has HLD.  PMHx:  OA knees, asthma, CTS, cataracts, diverticulitis, GERD, memory loss   OT comments  Pt making excellent progress towards OT goals this session. Session focus on functional mobility as precursor to higher level BADLs. Pt received seated in recliner reporting no pain, eager to mobilize with pt impulsively sit<>standing from recliner with supervision and RW. Pt completed functional mobility greater than a household distance with RW and MIN A. Pt additionally able to complete cognitive task during ambulation to work on dividing attention with good success. Noted pt likely to DC to CIR later today, will continue to follow acutely per POC.    Follow Up Recommendations  CIR    Equipment Recommendations  Other (comment) (RW)    Recommendations for Other Services      Precautions / Restrictions Precautions Precautions: Fall Required Braces or Orthoses: Other Brace Other Brace: ortho recommending right knee brace; per RN ordered but not arrived yet.       Mobility Bed Mobility               General bed mobility comments: pt OOB in recliner upon arrival and returned to recliner at end of session  Transfers Overall transfer level: Needs assistance Equipment used: Rolling walker (2 wheeled) Transfers: Sit to/from Stand Sit to Stand: Supervision         General transfer comment: pt sit<>stand impulsively from recliner with supervision    Balance Overall balance assessment: Needs  assistance Sitting-balance support: Feet supported;No upper extremity supported Sitting balance-Leahy Scale: Good     Standing balance support: During functional activity;No upper extremity supported Standing balance-Leahy Scale: Fair Standing balance comment: pt able to stand with no UE support to don mask with no LOB, min guard provided for safety                           ADL either performed or assessed with clinical judgement   ADL Overall ADL's : Needs assistance/impaired                         Toilet Transfer: RW;Ambulation;Minimal assistance Toilet Transfer Details (indicate cue type and reason): simulated via functional mobility with RW, min A for safety and balance         Functional mobility during ADLs: Moderate assistance;Rolling walker General ADL Comments: pt with improved activity tolerance this session about to increase functional mobility distance, pts VSS this session O2 96% HR 100 post mobility     Vision       Perception     Praxis      Cognition Arousal/Alertness: Awake/alert Behavior During Therapy: WFL for tasks assessed/performed Overall Cognitive Status: Impaired/Different from baseline                                 General Comments: pt cog seems to have improved from previous session, pt able to engage in divided attention  task during ambulation with able to correctly name animals related to next letter in alphabet with 100% accuracy. Pt with much better insight into deficits explaining next steps in POC and plans to go to rehab today/ tomorrow.        Exercises     Shoulder Instructions       General Comments pts son present during session    Pertinent Vitals/ Pain       Pain Assessment: No/denies pain  Home Living                                          Prior Functioning/Environment              Frequency  Min 2X/week        Progress Toward Goals  OT  Goals(current goals can now be found in the care plan section)  Progress towards OT goals: Progressing toward goals  Acute Rehab OT Goals Patient Stated Goal: to get stronger OT Goal Formulation: With patient/family Time For Goal Achievement: 08/16/20 Potential to Achieve Goals: Good  Plan Discharge plan remains appropriate;Frequency remains appropriate    Co-evaluation                 AM-PAC OT "6 Clicks" Daily Activity     Outcome Measure   Help from another person eating meals?: None Help from another person taking care of personal grooming?: A Little Help from another person toileting, which includes using toliet, bedpan, or urinal?: A Little Help from another person bathing (including washing, rinsing, drying)?: A Little Help from another person to put on and taking off regular upper body clothing?: None Help from another person to put on and taking off regular lower body clothing?: A Little 6 Click Score: 20    End of Session Equipment Utilized During Treatment: Rolling walker  OT Visit Diagnosis: Unsteadiness on feet (R26.81)   Activity Tolerance Patient tolerated treatment well   Patient Left in chair;with call bell/phone within reach;with family/visitor present   Nurse Communication Mobility status        Time: 6270-3500 OT Time Calculation (min): 11 min  Charges: OT General Charges $OT Visit: 1 Visit OT Treatments $Therapeutic Activity: 8-22 mins  Harley Alto., COTA/L Acute Rehabilitation Services [phone]    Precious Haws 08/06/2020, 3:54 PM

## 2020-08-06 NOTE — H&P (Incomplete)
Physical Medicine and Rehabilitation Admission H&P    Chief Complaint  Patient presents with  . Functional deficits  . ICH    HPI: Kenneth Harper is a 80 year old male with history of OA, memory loss who was admitted on 07/31/20 reports of numbness. Family reported history of fall with frontal HA the day prior to admission. UDS negative. CT head done revealing 6 cc acute hematoma in left thalamus with small intraventricular extension. Follow up MRI brain showed stable bleed with mild edema and age related atrophy. He was started on Cleveprex for BP control and was neurologically stable. 2D echo showed EF 55-60% with grade 1 DD. Dr. Pearlean Brownie felt that bleed was hypertensive in nature and recommended SBP<160.    He did report right knee pain and X rays done revealing severe tricompartmental OA and large suprapatellar joint effusion. Ortho consulted and recommended knee brace for knee contusion with question of meniscus injury -->to follow up with Dr. Eulah Pont after discharge. Patient with resultant right inattention with knee pain and staggering unsteady gait affecting ADLs and mobility. CIR recommended due to functional decline.   Review of Systems  Constitutional: Negative for chills and fever.  HENT: Negative for hearing loss and tinnitus.   Eyes: Negative for blurred vision and double vision.  Respiratory: Negative for cough and shortness of breath.   Cardiovascular: Positive for leg swelling. Negative for chest pain and palpitations.  Gastrointestinal: Negative for constipation and heartburn.  Genitourinary: Negative for dysuria and urgency.  Musculoskeletal: Negative for back pain, joint pain and neck pain.  Skin: Negative for rash.  Neurological: Positive for sensory change and weakness. Negative for dizziness and headaches.  Psychiatric/Behavioral: Positive for memory loss.     Past Medical History:  Diagnosis Date  . Arthritis    knees and back  . Asthma    childhood  .  Carpal tunnel syndrome   . Cataract   . Diverticulitis   . GERD (gastroesophageal reflux disease)   . Hypercholesterolemia   . Memory loss     Past Surgical History:  Procedure Laterality Date  . CATARACT EXTRACTION, BILATERAL Bilateral   . COLONOSCOPY WITH PROPOFOL N/A 10/29/2014   Procedure: COLONOSCOPY WITH PROPOFOL;  Surgeon: Charolett Bumpers, MD;  Location: [facility];  Service: Endoscopy;  Laterality: N/A;  . EYE SURGERY    . LUMBAR LAMINECTOMY/DECOMPRESSION MICRODISCECTOMY N/A 10/01/2015   Procedure: LEFT L2-3 MICRODISCECTOMY;  Surgeon: Kerrin Champagne, MD;  Location: [facility];  Service: Orthopedics;  Laterality: N/A;   Family History  Problem Relation Age of Onset  . Parkinson's disease Mother   . Heart failure Father   . Diabetes Brother   . Cancer Brother        throat  . Hydrocephalus Brother   . Diabetes Brother   . Migraines Sister     Social History:  Married. Has worked in printing--still works? Does not use AD.  Per reports that he quit smoking about 9 years ago. His smoking use included cigars. He quit after 42.00 years of use. He has never used smokeless tobacco. Per reports he does not drink alcohol and does not use drugs.    Allergies: No Known Allergies    Medications Prior to Admission  Medication Sig Dispense Refill  . atorvastatin (LIPITOR) 40 MG tablet Take 40 mg by mouth daily.   1    Drug Regimen Review { DRUG REGIMEN [id number]}  Home: Home Living Family/patient expects to be discharged to:: Private  residence Living Arrangements: Spouse/significant other Available Help at Discharge: Family,Available 24 hours/day,Available PRN/intermittently (wife present 24/7, son and daughter present PRN) Type of Home: House Home Access: Stairs to enter Technical brewer of Steps: 3 Entrance Stairs-Rails: None Home Layout: Two level,Able to live on main level with bedroom/bathroom Bathroom Toilet: Standard Bathroom Accessibility: Yes Home  Equipment: Kasandra Knudsen - single point Additional Comments: 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 wife does not require physical (A) but is unable to manage more than supervision care. Son  Lives With: Spouse   Functional History: Prior Function Level of Independence: Independent Comments: per pt was working before this hosp  Functional Status:  Mobility: Bed Mobility Overal bed mobility: Needs Assistance Bed Mobility: Supine to Sit Supine to sit: Min guard,HOB elevated General bed mobility comments: Up in chair upon PT arrival. Transfers Overall transfer level: Needs assistance Equipment used: Rolling walker (2 wheeled) Transfers: Sit to/from Stand Sit to Stand: Min guard,Min assist General transfer comment: Min A-min guard to stand from chair with cues for hand placement/technique as pt continually trying to stand by pulling up on RW. Stood from chair x6. Ambulation/Gait Ambulation/Gait assistance: Min guard Gait Distance (Feet): 150 Feet Assistive device: Rolling walker (2 wheeled) Gait Pattern/deviations: Step-through pattern,Wide base of support,Decreased stride length,Trunk flexed General Gait Details: Slow, mostly steady gait with only 1 instance of running into wall on right during a turns, otherwise no veering noted. Cues for RW proximity. Gait velocity: 1.56 ft/sec Gait velocity interpretation: 1.31 - 2.62 ft/sec, indicative of limited community ambulator    ADL: ADL Overall ADL's : Needs assistance/impaired Grooming: Wash/dry face,Oral care,Standing,Sitting,Minimal assistance Grooming Details (indicate cue type and reason): MIN A for standing balance with RW to complete oral care at sink Upper Body Bathing: Moderate assistance,Sitting Upper Body Bathing Details (indicate cue type and reason): UB bathing seated at sink Lower Body Bathing: Moderate assistance,Sit to/from stand Lower Body Bathing Details (indicate cue type and reason): MOD  A for LB bathing standing at sink, MOD A to stand Upper Body Dressing : Minimal assistance,Sitting Upper Body Dressing Details (indicate cue type and reason): to don new gown Lower Body Dressing: Moderate assistance,Sit to/from stand Lower Body Dressing Details (indicate cue type and reason): assist to pull pants up to waist line Toilet Transfer: Minimal assistance,RW,Ambulation,Regular Toilet,Moderate assistance,Cueing for safety,Grab bars Toilet Transfer Details (indicate cue type and reason): MIN A to ambulate into toilet, MOD A to power up from low toilet seat, cues for hand placement on grab bars Toileting- Clothing Manipulation and Hygiene: Sit to/from stand,Moderate assistance,Minimal assistance Toileting - Clothing Manipulation Details (indicate cue type and reason): pt able to complete posterior pericare post BM but required MIN- MOD A for standing balance during dynamic tasks Functional mobility during ADLs: Minimal assistance,Moderate assistance,Rolling walker General ADL Comments: pt much more willing to use RW for ambulation, in comparision to previous OT session. Pt able to complete toileting, UB/LB ADLs at sink and functional mobility with up to [facility]. pt limited by decreased activity tolerance and increased WOB with activity tolerance however SpO2 WFL, HR increased to 123 bpm with mobility and ADL tasks  Cognition: Cognition Overall Cognitive Status: Impaired/Different from baseline Arousal/Alertness: Awake/alert Orientation Level: Oriented to person Attention: Focused,Sustained,Alternating Focused Attention: Appears intact Sustained Attention: Appears intact Alternating Attention: Appears intact Memory: Impaired Memory Impairment: Storage deficit,Retrieval deficit Awareness: Impaired Awareness Impairment: Anticipatory impairment Problem Solving: Impaired Problem Solving Impairment: Verbal complex,Functional complex Executive Function: Reasoning,Sequencing Safety/Judgment:  Appears intact Cognition Arousal/Alertness:  Awake/alert Behavior During Therapy: WFL for tasks assessed/performed Overall Cognitive Status: Impaired/Different from baseline Area of Impairment: Memory,Awareness Memory: Decreased short-term memory Safety/Judgement: Decreased awareness of safety,Decreased awareness of deficits Awareness: Emergent Problem Solving: Difficulty sequencing,Requires verbal cues,Requires tactile cues General Comments: better able to state why he is in the hospital, "not sure what your terms are but I know I have a bleed in my brain." Able to recall 0/3 words from within session for STM recall.  Physical Exam: Blood pressure 126/80, pulse 96, temperature 98.4 F (36.9 C), temperature source Oral, resp. rate 20, height 5\' 11"  (1.803 m), weight 104.3 kg, SpO2 98 %. Physical Exam Vitals and nursing note reviewed.  Constitutional:      Appearance: He is well-developed.  Musculoskeletal:        General: Swelling (2+ edema right knee to foot) present.  Neurological:     Mental Status: He is alert.     Comments: Oriented to self, month, age and DOB--needed cues for place and situation. Able to follow simple motor commands. Sensory deficits RLE>RUE.      Results for orders placed or performed during the hospital encounter of 07/31/20 (from the past 48 hour(s))  Culture, blood (routine x 2)     Status: None (Preliminary result)   Collection Time: 08/04/20 10:37 PM   Specimen: BLOOD  Result Value Ref Range   Specimen Description BLOOD LEFT ANTECUBITAL    Special Requests      BOTTLES DRAWN AEROBIC AND ANAEROBIC Blood Culture adequate volume   Culture      NO GROWTH < 24 HOURS Performed at [facility] [address] [place], [place] [address]    Report Status PENDING   Culture, blood (routine x 2)     Status: None (Preliminary result)   Collection Time: 08/04/20 10:37 PM   Specimen: BLOOD LEFT HAND  Result Value Ref Range   Specimen Description BLOOD  LEFT HAND    Special Requests      BOTTLES DRAWN AEROBIC AND ANAEROBIC Blood Culture adequate volume   Culture      NO GROWTH < 24 HOURS Performed at [facility], [place] [address] [place], [place] [address]    Report Status PENDING    DG CHEST PORT 1 VIEW  Result Date: 08/04/2020 CLINICAL DATA:  Headache and fever EXAM: PORTABLE CHEST 1 VIEW COMPARISON:  04/05/2018 FINDINGS: The heart size and mediastinal contours are within normal limits. Both lungs are clear. The visualized skeletal structures are unremarkable. IMPRESSION: No active disease. Electronically Signed   By: Constance Holster M.D.   On: 08/04/2020 20:41       Medical Problem List and Plan: 1.  *** secondary to ***  -patient may *** shower  -ELOS/Goals: *** 2.  Antithrombotics: -DVT/anticoagulation:  Mechanical: Sequential compression devices, below knee Bilateral lower extremities  -antiplatelet therapy: N/A 3. Right knee contusion/Pain Management:  D/c meloxicam as SCr trending up. Continue low dose prednisone with Tylenol #3 prn.   4. Mood: LCSW to follow for for evaluation and support.   -antipsychotic agents: N/A 5. Neuropsych: This patient is not fully capable of making decisions on his own behalf. 6. Skin/Wound Care: routine pressure relief measures.  7. Fluids/Electrolytes/Nutrition: Monitor I/O. Check lytes in am.  8. HTN: Monitor BP tid--currently controlled without medications.  9. Hyponatremia: Recheck labs in am.  10. Impaired fasting glucose: Hgb A1c-5.8.   ***  Bary Leriche, PA-C 08/06/2020

## 2020-08-06 NOTE — Progress Notes (Deleted)
Inpatient Rehabilitation Medication Review by a Pharmacist  A complete drug regimen review was completed for this patient to identify any potential clinically significant medication issues.  Clinically significant medication issues were identified:  yes   Type of Medication Issue Identified Description of Issue Urgent (address now) Non-Urgent (address on AM team rounds) Plan Plan Accepted by Provider? (Yes / No / Pending AM Rounds)                                Additional Drug Therapy Needed  Dc summary said to resume meloxicam and atorvastatin. Non-urgent Secure chat PA Pam said to dc meloxicam  Other         Name of provider notified for urgent issues identified: Algis Liming, PA  Provider Method of Notification: Secure chat   For non-urgent medication issues to be resolved on team rounds tomorrow morning a [facility] Secure Chat Handoff was sent to:    Pharmacist comments:   Time spent performing this drug regimen review (minutes):  5   Tamar Lipscomb 08/06/2020 5:13 PM

## 2020-08-06 NOTE — Evaluation (Signed)
Clinical/Bedside Swallow Evaluation Patient Details  Name: Kenneth Harper MRN: [id number] Date of Birth: [birth year]  Today's Date: 08/06/2020 Time: SLP Start Time (ACUTE ONLY): 3557 SLP Stop Time (ACUTE ONLY): 0926 SLP Time Calculation (min) (ACUTE ONLY): 10 min  Past Medical History:  Past Medical History:  Diagnosis Date  . Arthritis    knees and back  . Asthma    childhood  . Carpal tunnel syndrome   . Cataract   . Diverticulitis   . GERD (gastroesophageal reflux disease)   . Hypercholesterolemia   . Memory loss    Past Surgical History:  Past Surgical History:  Procedure Laterality Date  . CATARACT EXTRACTION, BILATERAL Bilateral   . COLONOSCOPY WITH PROPOFOL N/A 10/29/2014   Procedure: COLONOSCOPY WITH PROPOFOL;  Surgeon: Garlan Fair, MD;  Location: [facility];  Service: Endoscopy;  Laterality: N/A;  . EYE SURGERY    . 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  Found to have right knee effusion. New HTN dx, has HLD.  PMHx:  OA knees, asthma, CTS, cataracts, diverticulitis, GERD, memory loss   Assessment / Plan / Recommendation Clinical Impression  New orders received for clinical swallow assessment with complaint for pocketing foods during past breakfast meal. Oral motor exam was unremarkable. Pt with upper and lower dentures, well fitting. Mastication and oral clearance of solids was intact. No pocketing exhibited during clinical assessment. No overt s/sx of aspiration with any PO. Spoke with nurse this date who states pt has  exhibited good improvements in all areas recently. Continue regular thin liquid diet with medicines as tolerated.  Frontal lobe and executive function deficit following: Nontraumatic intracerebral hemorrhage    Aspiration Risk  Mild aspiration risk    Diet Recommendation  Regular diet, thin liquids   Medication Administration: Whole meds with liquid    Other  Recommendations Oral Care Recommendations: Oral care BID   Follow up Recommendations Inpatient Rehab      Frequency and Duration            Prognosis        Swallow Study   General Date of Onset: 07/31/20 HPI: 80 yo male with onset of fall and memory changes for the events of the fall was brought to ED, found to have L thalamic acute hemorrhage of 5 cc volume, microhemorrhages L cerebellar vermis and subcortical R parieto-occipital areas, 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  Found to have right knee effusion. New HTN dx, has HLD.  PMHx:  OA knees, asthma, CTS, cataracts, diverticulitis, GERD, memory loss Type of Study: Bedside Swallow Evaluation Previous Swallow Assessment: none on file Diet Prior to this Study: Regular;Thin liquids Temperature Spikes Noted: No Respiratory Status: Room air History of Recent Intubation: No Behavior/Cognition: Alert;Cooperative Oral Cavity Assessment: Within Functional Limits Oral Care Completed by SLP: No Vision: Functional for self-feeding Self-Feeding Abilities: Able to feed self Patient Positioning: Upright in chair Baseline Vocal Quality: Normal Volitional Swallow: Able to elicit    Oral/Motor/Sensory Function Overall Oral Motor/Sensory Function: Within functional limits   Ice Chips Ice chips: Within functional limits Presentation: Spoon  Thin Liquid Thin Liquid: Within functional limits Presentation: Cup;Straw    Nectar Thick Nectar Thick Liquid: Not tested   Honey Thick Honey Thick Liquid: Not tested   Puree Puree: Within functional limits    Solid     Solid: Within functional limits      Tashawnda Bleiler H. MA, CCC-SLP Acute Rehabilitation Services   08/06/2020,9:37 AM

## 2020-08-06 NOTE — H&P (Signed)
Physical Medicine and Rehabilitation Admission H&P    Chief Complaint  Patient presents with  . Functional deficits  . ICH    HPI: Kenneth Harper is a 80 year old male with history of OA, memory loss who was admitted on 07/31/20 reports of numbness. Family reported history of fall with frontal HA the day prior to admission. UDS negative. CT head done revealing 6 cc acute hematoma in left thalamus with small intraventricular extension. Follow up MRI brain showed stable bleed with mild edema and age related atrophy. He was started on Cleveprex for BP control and was neurologically stable. 2D echo showed EF 55-60% with grade 1 DD. Dr. Leonie Man felt that bleed was hypertensive in nature and recommended SBP<160.    He did report right knee pain and X rays done revealing severe tricompartmental OA and large suprapatellar joint effusion. Ortho consulted and recommended knee brace for knee contusion with question of meniscus injury -->to follow up with Dr. Percell Miller after discharge. Patient with resultant right inattention with knee pain and staggering unsteady gait affecting ADLs and mobility. CIR recommended due to functional decline.   Pt reports "whatever they've done for my knee pain has worked".  Pain "resolved".  No pain/visual issues, and LBM yesterday Has condom cath- wants it gone if possible.   Review of Systems  Constitutional: Negative for chills and fever.  HENT: Negative for hearing loss and tinnitus.   Eyes: Negative for blurred vision and double vision.  Respiratory: Negative for cough and shortness of breath.   Cardiovascular: Positive for leg swelling. Negative for chest pain and palpitations.  Gastrointestinal: Negative for constipation and heartburn.  Genitourinary: Negative for dysuria and urgency.  Musculoskeletal: Negative for back pain, joint pain and neck pain.  Skin: Negative for rash.  Neurological: Positive for sensory change and weakness. Negative for dizziness  and headaches.  Psychiatric/Behavioral: Positive for memory loss.  All other systems reviewed and are negative.    Past Medical History:  Diagnosis Date  . Arthritis    knees and back  . Asthma    childhood  . Carpal tunnel syndrome   . Cataract   . Diverticulitis   . GERD (gastroesophageal reflux disease)   . Hypercholesterolemia   . Memory loss     Past Surgical History:  Procedure Laterality Date  . CATARACT EXTRACTION, BILATERAL Bilateral   . COLONOSCOPY WITH PROPOFOL N/A 10/29/2014   Procedure: COLONOSCOPY WITH PROPOFOL;  Surgeon: Garlan Fair, MD;  Location: [facility];  Service: Endoscopy;  Laterality: N/A;  . EYE SURGERY    . LUMBAR LAMINECTOMY/DECOMPRESSION MICRODISCECTOMY N/A 10/01/2015   Procedure: LEFT L2-3 MICRODISCECTOMY;  Surgeon: Jessy Oto, MD;  Location: [place];  Service: Orthopedics;  Laterality: N/A;   Family History  Problem Relation Age of Onset  . Parkinson's disease Mother   . Heart failure Father   . Diabetes Brother   . Cancer Brother        throat  . Hydrocephalus Brother   . Diabetes Brother   . Migraines Sister     Social History:  Married. Has worked in printing--still works? Does not use AD.  Per reports that he quit smoking about 9 years ago. His smoking use included cigars. He quit after 42.00 years of use. He has never used smokeless tobacco. Per reports he does not drink alcohol and does not use drugs.    Allergies: No Known Allergies    Medications Prior to Admission  Medication Sig Dispense  Refill  . [START ON 08/07/2020] amLODipine (NORVASC) 5 MG tablet Take 1 tablet (5 mg total) by mouth daily. 30 tablet 0  . atorvastatin (LIPITOR) 40 MG tablet Take 40 mg by mouth daily.   1  . [START ON 08/07/2020] meloxicam (MOBIC) 7.5 MG tablet Take 1 tablet (7.5 mg total) by mouth daily. 30 tablet 0  . pantoprazole (PROTONIX) 40 MG tablet Take 1 tablet (40 mg total) by mouth at bedtime. 30 tablet 0  . [START ON 08/07/2020] predniSONE  (DELTASONE) 10 MG tablet Take 1 tablet (10 mg total) by mouth daily with breakfast. 3 tablet 0  . senna-docusate (SENOKOT-S) 8.6-50 MG tablet Take 1 tablet by mouth 2 (two) times daily. 30 tablet 0    Drug Regimen Review  Drug regimen was reviewed and remains appropriate with no significant issues identified  Home: Home Living Family/patient expects to be discharged to:: Private residence Living Arrangements: Spouse/significant other,Children   Functional History:    Functional Status:  Mobility:          ADL:    Cognition: Cognition Orientation Level: Oriented X4    Physical Exam: Blood pressure 135/75, pulse 78, temperature 98.1 F (36.7 C), resp. rate 18, height 5\' 10"  (1.778 m), weight 102.5 kg, SpO2 95 %. Physical Exam Vitals and nursing note reviewed. Exam conducted with a chaperone present.  Constitutional:      Appearance: Normal appearance. He is well-developed.     Comments: Sitting up in bedside chair, son, Elta [place] in room, appropriate, NAD  HENT:     Head: Normocephalic and atraumatic.     Comments: Mild R facial droop- resolves with smile some Tongue midline    Right Ear: External ear normal.     Left Ear: External ear normal.     Nose: Nose normal. No congestion or rhinorrhea.     Mouth/Throat:     Mouth: Mucous membranes are moist.     Pharynx: Oropharynx is clear. No oropharyngeal exudate.  Eyes:     General:        Right eye: No discharge.        Left eye: No discharge.     Extraocular Movements: Extraocular movements intact.     Comments: No nystagmus  Cardiovascular:     Comments: RRR_ no JVD Pulmonary:     Comments: CTA B/L- no W/R/R- good air movement Abdominal:     Comments: Soft, NT, ND, (+)BS   Genitourinary:    Comments: Condom catheter in place Musculoskeletal:        General: Swelling (2+ edema right knee to foot) present.     Cervical back: Normal range of motion. No rigidity.     Comments: LUE 5/5 RUE- delt/biceps, triceps,  WE, grip and finger abd 5-/5 LLE- 5/5 RLE- HF, KE, DF and PF 5-/5  R knee swollen/effusion but no TTP  Skin:    General: Skin is warm and dry.     Comments: R AC fossa IV- looks a little leaky in dressing?  Neurological:     Mental Status: He is alert.     Comments: Oriented to self, month, age and DOB--needed cues for place and situation. Able to follow simple motor commands. Sensory deficits RLE>RUE.  Said memory not quite back to baseline, but Ox3 with cues Intact to light touch in all 4 extremities and face  Psychiatric:        Mood and Affect: Mood normal.        Behavior: Behavior normal.  Results for orders placed or performed during the hospital encounter of 07/31/20 (from the past 48 hour(s))  Culture, blood (routine x 2)     Status: None (Preliminary result)   Collection Time: 08/04/20 10:37 PM   Specimen: BLOOD  Result Value Ref Range   Specimen Description BLOOD LEFT ANTECUBITAL    Special Requests      BOTTLES DRAWN AEROBIC AND ANAEROBIC Blood Culture adequate volume   Culture      NO GROWTH 2 DAYS Performed at [facility] [address] [place], [place] [address]    Report Status PENDING   Culture, blood (routine x 2)     Status: None (Preliminary result)   Collection Time: 08/04/20 10:37 PM   Specimen: BLOOD LEFT HAND  Result Value Ref Range   Specimen Description BLOOD LEFT HAND    Special Requests      BOTTLES DRAWN AEROBIC AND ANAEROBIC Blood Culture adequate volume   Culture      NO GROWTH 2 DAYS Performed at [facility], [place] [address], [place], [place] [address]    Report Status PENDING    No results found.     Medical Problem List and Plan: 1.  R hemiparesis and balance impairment secondary to L thalamic hemorrhage  -patient may  shower  -ELOS/Goals: 4-7 days mod I 2.  Antithrombotics: -DVT/anticoagulation:  Mechanical: Sequential compression devices, below knee Bilateral lower extremities  -antiplatelet therapy: N/A 3.  Right knee contusion/Pain Management:  D/c meloxicam as SCr trending up. Continue low dose prednisone with Tylenol #3 prn.   4. Mood: LCSW to follow for for evaluation and support.   -antipsychotic agents: N/A 5. Neuropsych: This patient is not fully capable of making decisions on his own behalf. 6. Skin/Wound Care: routine pressure relief measures.  7. Fluids/Electrolytes/Nutrition: Monitor I/O. Check lytes in am.  8. HTN: Monitor BP tid--currently controlled without medications.  9. Hyponatremia: Recheck labs in am.  10. Impaired fasting glucose: Hgb A1c-5.8.  11. R knee swelling/effusion- severe DJD- might benefit from R knee brace? 12. Urinary frequency- doesn't think needs condom cath any more   Bary Leriche- PA-C 08/06/20   I have personally performed a face to face diagnostic evaluation of this patient and formulated the key components of the plan.  Additionally, I have personally reviewed laboratory data, imaging studies, as well as relevant notes and concur with the physician assistant's documentation above.   The patient's status has not changed from the original H&P.  Any changes in documentation from the acute care chart have been noted above.     Courtney Heys, MD 08/06/2020

## 2020-08-06 NOTE — Progress Notes (Signed)
Physical Medicine and Rehabilitation Consult Reason for Consult: Altered mental status Referring Physician: Dr. Leonel Ramsay  HPI: Kenneth Harper is a 80 y.o. right-handed male with history of memory loss, hyperlipidemia, quit smoking 9 years ago.  History taken from chart review and patient.  Son present.  Patient lives with spouse independent with assistive device.  1 level home.  Family assistance as needed.  He presented on 08/01/2020 with AMS and headaches after an unwitnessed fall.  Denies LOC.  CT/MRI showed acute 6 cm left thalamic hematoma.  Small volume intraventricular extension without hydrocephalus.  Admission chemistries unremarkable except glucose 1.4, creatinine 1.35, alcohol negative urine drug screen negative.  Echocardiogram with ejection fraction of 55-60%.  No wall motion abnormalities.  Neurology recommending conservative care for ICH.  Maintain on a regular diet.  Due to patient decreased functional ability due to cognitive changes, physical medicine rehab consult was requested.  Review of Systems  Constitutional: Negative for chills and fever.  HENT: Negative for hearing loss.   Eyes: Negative for blurred vision and double vision.  Respiratory: Negative for cough and shortness of breath.   Cardiovascular: Negative for chest pain, palpitations and leg swelling.  Gastrointestinal: Positive for constipation. Negative for heartburn, nausea and vomiting.  Genitourinary: Negative for dysuria, flank pain and hematuria.  Musculoskeletal: Positive for joint pain and myalgias.  Skin: Negative for rash.  Neurological: Positive for focal weakness and headaches.  Psychiatric/Behavioral: Positive for memory loss.  All other systems reviewed and are negative.  Past Medical History:  Diagnosis Date  . Arthritis    knees and back  . Asthma    childhood  . Carpal tunnel syndrome   . Cataract   . Diverticulitis   . GERD (gastroesophageal reflux disease)   .  Hypercholesterolemia   . Memory loss         Past Surgical History:  Procedure Laterality Date  . CATARACT EXTRACTION, BILATERAL Bilateral   . COLONOSCOPY WITH PROPOFOL N/A 10/29/2014   Procedure: COLONOSCOPY WITH PROPOFOL;  Surgeon: Garlan Fair, MD;  Location: [facility];  Service: Endoscopy;  Laterality: N/A;  . EYE SURGERY    . LUMBAR LAMINECTOMY/DECOMPRESSION MICRODISCECTOMY N/A 10/01/2015   Procedure: LEFT L2-3 MICRODISCECTOMY;  Surgeon: Jessy Oto, MD;  Location: [place];  Service: Orthopedics;  Laterality: N/A;        Family History  Problem Relation Age of Onset  . Parkinson's disease Mother   . Heart failure Father   . Diabetes Brother   . Cancer Brother        throat  . Hydrocephalus Brother   . Diabetes Brother   . Migraines Sister    Social History:  reports that he quit smoking about 9 years ago. His smoking use included cigars. He quit after 42.00 years of use. He has never used smokeless tobacco. He reports that he does not drink alcohol and does not use drugs. Allergies: No Known Allergies       Medications Prior to Admission  Medication Sig Dispense Refill  . atorvastatin (LIPITOR) 40 MG tablet Take 40 mg by mouth daily.   1    Home: Home Living Family/patient expects to be discharged to:: Private residence Living Arrangements: Spouse/significant other Available Help at Discharge: Family,Available PRN/intermittently,Available 24 hours/day Type of Home: House Home Access: Level entry [place]: One level [place] - single point  Functional History: Prior Function Level of Independence: Independent with assistive device(s) Comments: per  pt was working before this hosp Functional Status:  Mobility: Bed Mobility General bed mobility comments: up when PT arrived Transfers Overall transfer level: Needs assistance Equipment used: 1 person hand held assist Transfers: Sit to/from  Stand Sit to Stand: Min guard General transfer comment: min guard for safety Ambulation/Gait Ambulation/Gait assistance: Min assist Gait Distance (Feet): 60 Feet Assistive device: 1 person hand held assist,IV Pole Gait Pattern/deviations: Step-through pattern,Wide base of support,Decreased stride length General Gait Details: pt takes extra time and makes wide turns on IV pole to change directions, mild unsteadiness with tedency to make unsafe choices with movement such as backing up to the chair from several feet away Gait velocity: reduced  ADL:  Cognition: Cognition Overall Cognitive Status: Impaired/Different from baseline Orientation Level: Oriented X4 Cognition Arousal/Alertness: Awake/alert Behavior During Therapy: Flat affect Overall Cognitive Status: Impaired/Different from baseline Area of Impairment: Memory,Safety/judgement,Awareness,Problem solving Memory: Decreased short-term memory Safety/Judgement: Decreased awareness of safety,Decreased awareness of deficits Awareness: Intellectual Problem Solving: Slow processing,Requires verbal cues,Requires tactile cues General Comments: pt is unsteady on his feet and requires help to turn and use narrow base of support, but is not fully aware of these issues  Blood pressure (!) 149/92, pulse 100, temperature 97.8 F (36.6 C), temperature source Oral, resp. rate 18, height 5\' 11"  (1.803 m), weight 104.3 kg, SpO2 97 %. Physical Exam Vitals reviewed.  Constitutional:      General: He is not in acute distress.    Appearance: Normal appearance. He is obese. He is not ill-appearing.  HENT:     Head: Normocephalic and atraumatic.     Right Ear: External ear normal.     Left Ear: External ear normal.     Nose: Nose normal.  Eyes:     General:        Right eye: No discharge.        Left eye: No discharge.     Extraocular Movements: Extraocular movements intact.  Cardiovascular:     Rate and Rhythm: Normal rate and regular  rhythm.  Pulmonary:     Effort: Pulmonary effort is normal.     Breath sounds: Normal breath sounds.  Abdominal:     General: Abdomen is flat. Bowel sounds are normal. There is no distension.  Musculoskeletal:     Cervical back: Normal range of motion and neck supple.     Comments: Right knee with edema and tenderness  Skin:    General: Skin is dry.     Comments: Right knee with erythema  Neurological:     Mental Status: He is alert.     Comments: Alert and oriented x3 Makes eye contact with examiner.   Follows simple commands. Motor: B/l UE 5/5 proximal to distal LLE: HF, KE 4+/5, ADF 5/5 RLE: HF, KE 4-/5 (apin inhibition), ADF 5/5 Sensation intact to light touch  Psychiatric:        Mood and Affect: Mood normal.        Behavior: Behavior normal.    Lab Results Last 24 Hours       Results for orders placed or performed during the hospital encounter of 07/31/20 (from the past 24 hour(s))  Urine rapid drug screen (hosp performed)     Status: None   Collection Time: 08/01/20  8:59 AM  Result Value Ref Range   Opiates NONE DETECTED NONE DETECTED   Cocaine NONE DETECTED NONE DETECTED   Benzodiazepines NONE DETECTED NONE DETECTED   Amphetamines NONE DETECTED NONE DETECTED   Tetrahydrocannabinol NONE  DETECTED NONE DETECTED   Barbiturates NONE DETECTED NONE DETECTED  Urinalysis, Routine w reflex microscopic     Status: Abnormal   Collection Time: 08/01/20  8:59 AM  Result Value Ref Range   Color, Urine YELLOW YELLOW   APPearance CLEAR CLEAR   Specific Gravity, Urine 1.017 1.005 - 1.030   pH 5.0 5.0 - 8.0   Glucose, UA NEGATIVE NEGATIVE mg/dL   Hgb urine dipstick NEGATIVE NEGATIVE   Bilirubin Urine NEGATIVE NEGATIVE   Ketones, ur 5 (A) NEGATIVE mg/dL   Protein, ur 30 (A) NEGATIVE mg/dL   Nitrite NEGATIVE NEGATIVE   Leukocytes,Ua NEGATIVE NEGATIVE   RBC / HPF 0-5 0 - 5 RBC/hpf   WBC, UA 0-5 0 - 5 WBC/hpf   Bacteria, UA RARE (A) NONE SEEN    Squamous Epithelial / LPF 0-5 0 - 5   Mucus PRESENT   Hemoglobin A1c     Status: Abnormal   Collection Time: 08/01/20  9:52 AM  Result Value Ref Range   Hgb A1c MFr Bld 5.8 (H) 4.8 - 5.6 %   Mean Plasma Glucose 119.76 mg/dL  Lipid panel     Status: Abnormal   Collection Time: 08/01/20  9:52 AM  Result Value Ref Range   Cholesterol 123 0 - 200 mg/dL   Triglycerides 86 <150 mg/dL   HDL 34 (L) >40 mg/dL   Total CHOL/HDL Ratio 3.6 RATIO   VLDL 17 0 - 40 mg/dL   LDL Cholesterol 72 0 - 99 mg/dL  MRSA PCR Screening     Status: None   Collection Time: 08/01/20 12:35 PM   Specimen: Nasal Mucosa; Nasopharyngeal  Result Value Ref Range   MRSA by PCR NEGATIVE NEGATIVE      Imaging Results (Last 48 hours)  CT Head Wo Contrast  Result Date: 07/31/2020 CLINICAL DATA:  Head trauma.  Left-sided facial droop. EXAM: CT HEAD WITHOUT CONTRAST TECHNIQUE: Contiguous axial images were obtained from the base of the skull through the vertex without intravenous contrast. COMPARISON:  Brain MRI 04/12/2018 FINDINGS: Brain: Acute hemorrhage in the left thalamus, 23 x 35 x 14 mm. Small volume intraventricular extension in the dependent lateral ventricles. No hydrocephalus. No visible infarct, mass, or extra-axial collection. Vascular: No hyperdense vessel or unexpected calcification. Skull: Normal. Negative for fracture or focal lesion. Sinuses/Orbits: No evidence of injury.  Bilateral cataract resection Other: Critical Value/emergent results were called by telephone at the time of interpretation on 07/31/2020 at 10:49 am to provider Tyrone Nine, who verbally acknowledged these results. IMPRESSION: 6 cc acute hematoma in the left thalamus. Small volume intraventricular extension without hydrocephalus. Electronically Signed   By: Monte Fantasia M.D.   On: 07/31/2020 10:53   MR BRAIN WO CONTRAST  Result Date: 07/31/2020 CLINICAL DATA:  Follow-up examination for acute stroke. EXAM: MRI HEAD WITHOUT  CONTRAST TECHNIQUE: Multiplanar, multiecho pulse sequences of the brain and surrounding structures were obtained without intravenous contrast. COMPARISON:  Prior head CT from earlier the same day as well as previous MRI from 04/12/2018. FINDINGS: Brain: Generalized age-related cerebral atrophy. Patchy T2/FLAIR hyperintensity within the periventricular and deep white matter both cerebral hemispheres most consistent with chronic small vessel ischemic disease, stable. Previously identified acute hemorrhage centered at the left thalamus again seen, overall size and morphology is not significantly changed with the hemorrhage measuring 1.6 x 2.3 x 2.7 cm (estimated volume 5 cc). Mild surrounding vasogenic edema without significant regional mass effect. Associated intraventricular extension with blood seen layering within the occipital horns of  both lateral ventricles. Stable ventricular size and morphology without hydrocephalus. Additional scattered siderosis noted about the cerebellum, likely related to redistribution. No other acute intracranial hemorrhage. Few additional chronic micro hemorrhages noted at the left cerebellar vermis (series 11, image 16) and subcortical right parieto-occipital region (series 11, image 35), suspected to be hypertensive in nature. No other foci of restricted diffusion to suggest acute or subacute ischemia. No other areas of encephalomalacia to suggest chronic cortical infarction. No mass lesion or significant midline shift. No extra-axial fluid collection. No made of a partially empty sella. Midline structures intact. Vascular: Major intracranial vascular flow voids are well maintained and normal. Skull and upper cervical spine: Craniocervical junction within normal limits. Upper cervical spine normal. Bone marrow signal intensity within normal limits. No scalp soft tissue abnormality. Sinuses/Orbits: Patient status post bilateral ocular lens replacement. Globes and orbital soft tissues  demonstrate no acute finding. Paranasal sinuses are largely clear. No significant mastoid effusion. Inner ear structures grossly normal. Other: None. IMPRESSION: 1. No significant interval change in size and morphology of acute hemorrhage centered at the left thalamus. Mild surrounding vasogenic edema without significant regional mass effect. Associated intraventricular extension without hydrocephalus. 2. No other acute intracranial abnormality. 3. Age-related cerebral atrophy with mild chronic small vessel ischemic disease. Electronically Signed   By: Jeannine Boga M.D.   On: 07/31/2020 22:57     Assessment/Plan: Diagnosis: Left thalamic hematoma Stroke: Continue secondary stroke prophylaxis and Risk Factor Modification listed below:   Blood Pressure Management:  Continue current medication with prn's with permisive HTN per primary team PT/OT for mobility, ADL training  Labs and independently reviewed.  Records reviewed and summated above.  1. Does the need for close, 24 hr/day medical supervision in concert with the patient's rehab needs make it unreasonable for this patient to be served in a less intensive setting? No  2. Co-Morbidities requiring supervision/potential complications: memory loss, hyperlipidemia, ?AKI (avoid nephrotoxic meds, repeat labs, encourage fluids), elevated blood pressure (monitor and provide prns in accordance with increased physical exertion and pain), right knee pain likely secondary to OA (Voltaren gel) 3. Due to safety, skin/wound care, disease management, pain management and patient education, does the patient require 24 hr/day rehab nursing? No 4. Does the patient require coordinated care of a physician, rehab nurse, therapy disciplines of PT/OT/SLP to address physical and functional deficits in the context of the above medical diagnosis(es)? No Addressing deficits in the following areas: balance, endurance, locomotion, strength, transferring, bathing,  dressing, toileting, cognition and psychosocial support 5. Can the patient actively participate in an intensive therapy program of at least 3 hrs of therapy per day at least 5 days per week? Yes 6. The potential for patient to make measurable gains while on inpatient rehab is good and fair 7. Anticipated functional outcomes upon discharge from inpatient rehab are modified independent and supervision  with PT, modified independent and supervision with OT, modified independent with SLP. 8. Estimated rehab length of stay to reach the above functional goals is: 4-6 days. 9. Anticipated discharge destination: Home 10. Overall Rehab/Functional Prognosis: excellent  RECOMMENDATIONS: This patient's condition is appropriate for continued rehabilitative care in the following setting: Patient refusing CIR and would like to go home.  Given functional level on day of evaluation, anticipate patient will continue to make progress and will not require CIR.  Recommend home with outpatient therapies at this time. Patient has agreed to participate in recommended program. No Note that insurance prior authorization may be required for reimbursement  for recommended care.  Comment: Rehab Admissions Coordinator to follow up.  I have personally performed a face to face diagnostic evaluation, including, but not limited to relevant history and physical exam findings, of this patient and developed relevant assessment and plan.  Additionally, I have reviewed and concur with the physician assistant's documentation above.   Delice Lesch, MD, ABPMR Lavon Paganini Angiulli, PA-C 08/02/2020

## 2020-08-06 NOTE — Care Management Important Message (Signed)
Important Message  Patient Details  Name: Kenneth Harper MRN: [id number] Date of Birth: Jun 10, 1941   Medicare Important Message Given:  Yes     Shelda Altes 08/06/2020, 2:10 PM

## 2020-08-06 NOTE — Progress Notes (Addendum)
PMR Admission Coordinator Pre-Admission Assessment  Patient: Kenneth Harper is an 80 y.o., male MRN: [id number] DOB: March 19, 1941 Height: $RemoveBefo'5\' 11"'HvihYXNroMM$  (180.3 cm) Weight: 104.3 kg                                                                                                                                                  Insurance Information HMO: yes    PPO:      PCP:      IPA:      80/20:      OTHER:  PRIMARY: [employer]       Policy#: [id number]      Subscriber: patient CM Name: [name]      Phone#: [phone] ext [phone]     Fax#: [phone] Pre-Cert#: [id number]      Employer: n/a Benefits:  Phone #: n/a-online at [email or web address]     Name: n/a Eff. Date: 07/14/2019-present     Deduct: does not have for in-network providers      Out of Pocket Max: $3,900 ($20 met)      Life Max: NA  CIR: $295/day co-pay for days 1-6, $0/day co-pay for days 7-90      SNF: $0/day co-pay for days 1-20, $188/day co-pay for days 21-100; limited to 100 days/cal yr Outpatient: $10-$20/visit co-pay pending service; visits limited by medical necessity     Co-Pay:  Home Health: 100% coverage, 0% co-insurance; limited by medical necessity      Co-Pay:  DME: 80% coverage     Co-Pay: 20% Providers: in-network SECONDARY:       Policy#:       Phone#:   Development worker, community:       Phone#:   The Engineer, [employer]" for patients in Inpatient Rehabilitation Facilities with attached "Privacy Act [place] Records" was provided and verbally reviewed with: Patient  Emergency Contact Information         Contact Information    Name Relation Home Work [place] Daughter   [phone]   Cleto, Claggett [phone]  [phone]   Davine, Coba [phone]  [phone]     Current Medical History  Patient Admitting Diagnosis: L thalamic hemorrhage  History of Present Illness: Ptis a 80 y.o.right-handed malewith history of memory  loss, hyperlipidemia, quit smoking 9 years ago.History taken from chart review and patient. Son present. Patient liveswith spouse independent with assistive device. 1 level home. Family assistance as needed. He presented on 08/01/2020 with AMS and headaches after an unwitnessed fall. Denies LOC. CT/MRI showed acute 6 cmleft thalamic hematoma.Small volume intraventricular extension without hydrocephalus. Admission chemistries unremarkable except glucose 1.4, creatinine 1.35,alcohol negative urine drug screen negative. Echocardiogram with ejection fraction of 55-60%. No wall motion abnormalities. Neurology recommending conservativecare forICH. Maintain on a regular diet. Due to patient decreased functional ability  due to cognitive changes,physical medicine rehab consult was requested.  Complete NIHSS TOTAL: 2 Glasgow Coma Scale Score: 15  Past Medical History      Past Medical History:  Diagnosis Date  . Arthritis    knees and back  . Asthma    childhood  . Carpal tunnel syndrome   . Cataract   . Diverticulitis   . GERD (gastroesophageal reflux disease)   . Hypercholesterolemia   . Memory loss     Family History  family history includes Cancer in his brother; Diabetes in his brother and brother; Heart failure in his father; Hydrocephalus in his brother; Migraines in his sister; Parkinson's disease in his mother.  Prior Rehab/Hospitalizations:  Has the patient had prior rehab or hospitalizations prior to admission? No  Has the patient had major surgery during 100 days prior to admission? No  Current Medications   Current Facility-Administered Medications:  .  acetaminophen (TYLENOL) tablet 650 mg, 650 mg, Oral, Q4H PRN, 650 mg at 08/02/20 1016 **OR** acetaminophen (TYLENOL) 160 MG/5ML solution 650 mg, 650 mg, Per Tube, Q4H PRN **OR** acetaminophen (TYLENOL) suppository 650 mg, 650 mg, Rectal, Q4H PRN, [name], MD .   acetaminophen-codeine (TYLENOL #3) 300-30 MG per tablet 2 tablet, 2 tablet, Oral, Q6H PRN, [name], MD .  amLODipine (NORVASC) tablet 5 mg, 5 mg, Oral, Daily, Bailey-Modzik, [name], NP, 5 mg at 08/05/20 0815 .  labetalol (NORMODYNE) injection 10 mg, 10 mg, Intravenous, Q2H PRN, Leonie Man, Pramod S, MD .  meloxicam (MOBIC) tablet 7.5 mg, 7.5 mg, Oral, Daily, [name], MD, 7.5 mg at 08/05/20 0816 .  nicardipine (CARDENE) 20mg  in 0.86% saline 223ml IV infusion (0.1 mg/ml), 3-15 mg/hr, Intravenous, Continuous, [name], MD, Last Rate: 25 mL/hr at 08/02/20 0905, 2.5 mg/hr at 08/02/20 0905 .  pantoprazole (PROTONIX) EC tablet 40 mg, 40 mg, Oral, QHS, [name], MD, 40 mg at 08/04/20 2145 .  predniSONE (DELTASONE) tablet 10 mg, 10 mg, Oral, Q breakfast, [name], MD, 10 mg at 08/05/20 0815 .  senna-docusate (Senokot-S) tablet 1 tablet, 1 tablet, Oral, BID, [name], MD, 1 tablet at 08/03/20 2138  Patients Current Diet:     Diet Order                  Diet regular Room service appropriate? Yes with Assist; Fluid consistency: Thin  Diet effective now                  Precautions / Restrictions Precautions Precautions: Fall Precaution Comments: monitor vitals Other Brace: ortho recommending right knee brace; per RN ordered but not arrived yet. Restrictions Weight Bearing Restrictions: No   Has the patient had 2 or more falls or a fall with injury in the past year?No  Prior Activity Level Community (5-7x/wk): driving, working full-time  Prior Functional Level Prior Function Level of Independence: Independent Comments: per pt was working before this [place]: Did the patient need help bathing, dressing, using the toilet or eating?  Independent  Indoor Mobility: Did the patient need assistance with walking from room to room (with or without device)? Independent  Stairs: Did the patient need assistance with  internal or external stairs (with or without device)? Independent  Functional Cognition: Did the patient need help planning regular tasks such as shopping or remembering to take medications? Independent  Home Assistive Devices / Equipment Home Assistive Devices/Equipment: None Home Equipment: Cane - single point  Prior Device Use:  Indicate devices/aids used by the patient prior to current illness, exacerbation or injury? None of the above  Current Functional Level Cognition  Arousal/Alertness: Awake/alert Overall Cognitive Status: Impaired/Different from baseline Orientation Level: Oriented to person Safety/Judgement: Decreased awareness of safety,Decreased awareness of deficits General Comments: Does not know why he is in the hospital, "not sure why I am here." Does not recall therapists coming to work with him at all. Attention: Focused,Sustained,Alternating Focused Attention: Appears intact Sustained Attention: Appears intact Alternating Attention: Appears intact Memory: Impaired Memory Impairment: Storage deficit,Retrieval deficit Awareness: Impaired Awareness Impairment: Anticipatory impairment Problem Solving: Impaired Problem Solving Impairment: Verbal complex,Functional complex Executive Function: Reasoning,Sequencing Safety/Judgment: Appears intact    Extremity Assessment (includes Sensation/Coordination)  Upper Extremity Assessment: Overall WFL for tasks assessed  Lower Extremity Assessment: Defer to PT evaluation RLE Deficits / Details: R hip 4+ strength, knee and ankle 4+    ADLs  Overall ADL's : Needs assistance/impaired Grooming: Wash/dry face,Oral care,Standing,Sitting,Minimal assistance Grooming Details (indicate cue type and reason): MIN A for standing balance with RW to complete oral care at sink Upper Body Bathing: Moderate assistance,Sitting Upper Body Bathing Details (indicate cue type and reason): UB bathing seated at sink Lower Body Bathing:  Moderate assistance,Sit to/from stand Lower Body Bathing Details (indicate cue type and reason): MOD A for LB bathing standing at sink, MOD A to stand Upper Body Dressing : Minimal assistance,Sitting Upper Body Dressing Details (indicate cue type and reason): to don new gown Lower Body Dressing: Moderate assistance,Sit to/from stand Lower Body Dressing Details (indicate cue type and reason): assist to pull pants up to waist line Toilet Transfer: Minimal assistance,RW,Ambulation,Regular Toilet,Moderate assistance,Cueing for safety,Grab bars Toilet Transfer Details (indicate cue type and reason): MIN A to ambulate into toilet, MOD A to power up from low toilet seat, cues for hand placement on grab bars Toileting- Clothing Manipulation and Hygiene: Sit to/from stand,Moderate assistance,Minimal assistance Toileting - Clothing Manipulation Details (indicate cue type and reason): pt able to complete posterior pericare post BM but required MIN- MOD A for standing balance during dynamic tasks Functional mobility during ADLs: Minimal assistance,Moderate assistance,Rolling walker General ADL Comments: pt much more willing to use RW for ambulation, in comparision to previous OT session. Pt able to complete toileting, UB/LB ADLs at sink and functional mobility with up to [facility] pt limited by decreased activity tolerance and increased WOB with activity tolerance however SpO2 WFL, HR increased to 123 bpm with mobility and ADL tasks    Mobility  Overal bed mobility: Needs Assistance Bed Mobility: Supine to Sit Supine to sit: Min guard,HOB elevated General bed mobility comments: Increased time/effort to get to EOB with use of rail.    Transfers  Overall transfer level: Needs assistance Equipment used: Rolling walker (2 wheeled) Transfers: Sit to/from Stand Sit to Stand: Mod assist General transfer comment: Assist to power to standing with cues for hand placement/technique. Wide BoS initially. Transferred  to chair post ambulation.    Ambulation / Gait / Stairs / Wheelchair Mobility  Ambulation/Gait Ambulation/Gait assistance: Editor, commissioning (Feet): 75 Feet Assistive device: Rolling walker (2 wheeled) Gait Pattern/deviations: Step-through pattern,Wide base of support,Decreased stride length,Trunk flexed,Staggering right General Gait Details: Slow, unsteady gait with pt drifting towards right bumping into things on right side; cues for RW proximity. Gait velocity: decr    Posture / Balance Balance Overall balance assessment: Needs assistance Sitting-balance support: Feet supported,No upper extremity supported Sitting balance-Leahy Scale: Good Standing balance support: During functional activity Standing balance-Leahy Scale: Poor Standing  balance comment: Requires UE support. Able to perform marching in standing in RW.    Special needs/care consideration  Skin ecchymosis: right knee, Bowel and Bladder incontinence and Designated visitor Bretton Tandy, son     Previous Home Environment (from acute therapy documentation) Living Arrangements: Spouse/significant other  Lives With: Spouse Available Help at Discharge: Family,Available 24 hours/day,Available PRN/intermittently (wife present 24/7, son and daughter present PRN) Type of Home: [place]: Two level,Able to live on main level with bedroom/bathroom Home Access: Stairs to enter Entrance Stairs-Rails: None Entrance Stairs-Number of Steps: 3 Bathroom Toilet: Standard Bathroom Accessibility: Yes How Accessible: Accessible via walker [place]: No Additional Comments: 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 wife does not require physical (A) but is unable to manage more than supervision care. Son  Discharge Living Setting Plans for Discharge Living Setting: Patient's home Type of Home at Discharge: House Discharge Home Layout: Two level,Able to live on main level  with bedroom/bathroom Discharge Home Access: Stairs to enter Entrance Stairs-Rails: None Entrance Stairs-Number of Steps: 3 Discharge Bathroom Toilet: Standard Discharge Bathroom Accessibility: Yes How Accessible: Accessible via walker Does the patient have any problems obtaining your medications?: No  Social/Family/Support Systems Patient Roles: Spouse,Parent Anticipated Caregiver: Erdem Naas, son; Jacqlyn Larsen, daughter; wife Anticipated Caregiver's Contact Information: Elta [place]: [phone] Caregiver Availability: 24/7 (wife available 24/7; son and daughter are available PRN) Discharge Plan Discussed with Primary Caregiver: Yes Is Caregiver In Agreement with Plan?: Yes Does Caregiver/Family have Issues with Lodging/Transportation while Pt is in Rehab?: No   Goals Patient/Family Goal for Rehab: Mod I-Supervision: OT/PT. Mod I-ST Expected length of stay: 4-6 days Pt/Family Agrees to Admission and willing to participate: Yes Program Orientation Provided & Reviewed with Pt/Caregiver Including Roles  & Responsibilities: Yes   Decrease burden of Care through IP rehab admission: NA   Possible need for SNF placement upon discharge:NA   Patient Condition: This patient's medical and functional status has changed since the consult dated: 08/02/2020 in which the Rehabilitation Physician determined and documented that the patient's condition is appropriate for intensive rehabilitative care in an inpatient rehabilitation facility. See "History of Present Illness" (above) for medical update. Functional changes are: Pt. Now min A to Min guard with ADLs, transfers, and gait. Patient's medical and functional status update has been discussed with the Rehabilitation physician and patient remains appropriate for inpatient rehabilitation. Will admit to inpatient rehab today.  Preadmission Screen Completed By:  Bethel Born, CCC-SLP, 08/05/2020 4:11  PM ______________________________________________________________________   Discussed status with Dr. Dagoberto Ligas on at 08/06/2020 and received approval for admission today.  Admission Coordinator:  Bethel Born, time 13:30 Sudie Grumbling 08/06/2020 with updates by Clemens Catholic           Cosigned by: Courtney Heys, MD at 08/06/2020 2:08 PM    Revision History

## 2020-08-06 NOTE — Progress Notes (Addendum)
Inpatient Rehabilitation Medication Review by a Pharmacist  A complete drug regimen review was completed for this patient to identify any potential clinically significant medication issues.  Clinically significant medication issues were identified:  yes   Type of Medication Issue Identified Description of Issue Urgent (address now) Non-Urgent (address on AM team rounds) Plan Plan Accepted by Provider? (Yes / No / Pending AM Rounds)                                Additional Drug Therapy Needed  Dc summary said to resume meloxicam and atorvastatin. Non-urgent Secure chat PA Dc meloxicam per Pam. Atorvastatin resumed  Other         Name of provider notified for urgent issues identified: Algis Liming, PA  Provider Method of Notification: Secure chat   For non-urgent medication issues to be resolved on team rounds tomorrow morning a [facility] Secure Chat Handoff was sent to:    Pharmacist comments: Resolved  Time spent performing this drug regimen review (minutes):  5   Minh Pham 08/06/2020 5:13 PM

## 2020-08-06 NOTE — Progress Notes (Signed)
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  Found to have right knee effusion. New HTN dx, has HLD.  PMHx:  OA knees, asthma, CTS, cataracts, diverticulitis, GERD, memory loss    PT Comments    Patient progressing well towards PT goals. Memory seems slightly improved from yesterday as pt able to state why he is in the hospital today. Improved ambulation distance with min guard assist and use of RW for support. No veering to the right this date with only 1 instance of bumping into wall on right during a turn. Reports no knee pain. Continues to have poor STM, as pt not able to recall 0/3 words during session. Performed 5xSTS in 27.61 seconds (normative for his age is 12.6 sec) indicating decreased functional strength, balance deficits and increased fall risk. Continue to recommend CIR to address deficits. Motivated to maximize independence and return home. Will follow.     Follow Up Recommendations  CIR     Equipment Recommendations  Rolling walker with 5" wheels    Recommendations for Other Services       Precautions / Restrictions Precautions Precautions: Fall Restrictions Weight Bearing Restrictions: No    Mobility  Bed Mobility               General bed mobility comments: Up in chair upon PT arrival.  Transfers Overall transfer level: Needs assistance Equipment used: Rolling walker (2 wheeled) Transfers: Sit to/from Stand Sit to Stand: Min guard;Min assist         General transfer comment: Min A-min guard to stand from chair with cues for hand placement/technique as pt  continually trying to stand by pulling up on RW. Stood from chair x6.  Ambulation/Gait Ambulation/Gait assistance: Min guard Gait Distance (Feet): 150 Feet Assistive device: Rolling walker (2 wheeled) Gait Pattern/deviations: Step-through pattern;Wide base of support;Decreased stride length;Trunk flexed Gait velocity: 1.56 ft/sec Gait velocity interpretation: 1.31 - 2.62 ft/sec, indicative of limited community ambulator General Gait Details: Slow, mostly steady gait with only 1 instance of running into wall on right during a turns, otherwise no veering noted. Cues for RW proximity.   Stairs             Wheelchair Mobility    Modified Rankin (Stroke Patients Only) Modified Rankin (Stroke Patients Only) Pre-Morbid Rankin Score: No symptoms Modified Rankin: Moderately severe disability     Balance Overall balance assessment: Needs assistance Sitting-balance support: Feet supported;No upper extremity supported Sitting balance-Leahy Scale: Good     Standing balance support: During functional activity Standing balance-Leahy Scale: Poor Standing balance comment: Requires UE support.                            Cognition Arousal/Alertness: Awake/alert Behavior During Therapy: WFL for tasks assessed/performed Overall Cognitive Status: Impaired/Different from baseline Area of Impairment: Memory;Awareness                     Memory: Decreased short-term memory     Awareness: Emergent   General Comments: better able to state why he is in the hospital, "not sure what your  terms are but I know I have a bleed in my brain." Able to recall 0/3 words from within session for STM recall.      Exercises      General Comments General comments (skin integrity, edema, etc.): Performed 5xSTS in 27.61 seconds (normative for his age is 12.6 sec) indicating decreased functional strength, balance deficits and increased fall risk.      Pertinent Vitals/Pain Pain  Assessment: No/denies pain    Home Living                      Prior Function            PT Goals (current goals can now be found in the care plan section) Progress towards PT goals: Progressing toward goals    Frequency    Min 4X/week      PT Plan Current plan remains appropriate    Co-evaluation              AM-PAC PT "6 Clicks" Mobility   Outcome Measure  Help needed turning from your back to your side while in a flat bed without using bedrails?: None Help needed moving from lying on your back to sitting on the side of a flat bed without using bedrails?: A Little Help needed moving to and from a bed to a chair (including a wheelchair)?: A Little Help needed standing up from a chair using your arms (e.g., wheelchair or bedside chair)?: A Little Help needed to walk in hospital room?: A Little Help needed climbing 3-5 steps with a railing? : A Lot 6 Click Score: 18    End of Session Equipment Utilized During Treatment: Gait belt Activity Tolerance: Patient tolerated treatment well Patient left: in chair;with call bell/phone within reach;with chair alarm set Nurse Communication: Mobility status;Other (comment) (wants to wash up) PT Visit Diagnosis: Unsteadiness on feet (R26.81);Muscle weakness (generalized) (M62.81);History of falling (Z91.81);Hemiplegia and hemiparesis Hemiplegia - Right/Left: Right Hemiplegia - dominant/non-dominant: Dominant     Time: 2263-3354 PT Time Calculation (min) (ACUTE ONLY): 15 min  Charges:  $Gait Training: 8-22 mins                     Marisa Severin, PT, DPT Acute Rehabilitation Services Pager [phone] Office [place] 08/06/2020, 10:08 AM

## 2020-08-06 NOTE — Discharge Summary (Signed)
Discharge Summary  Kenneth Harper QVZ:[id number] DOB: October 31, 1940  PCP: Wenda Low, MD  Admit date: 07/31/2020 Discharge date: 08/06/2020  Time spent: 35  Recommendations for Outpatient Follow-up:  1. PCP  Discharge Diagnoses:  Active Hospital Problems   Diagnosis Date Noted  . Hemorrhagic stroke ([place])   . Memory loss   . Dyslipidemia   . Elevated blood pressure reading   . AKI (acute kidney injury) ([place])   . Primary osteoarthritis of right knee   . ICH (intracerebral hemorrhage) ([place]) 07/31/2020    Resolved Hospital Problems  No resolved problems to display.    Discharge Condition: Improved  Diet recommendation: Cardiac  Vitals:   08/06/20 0835 08/06/20 1247  BP: 124/75 126/80  Pulse: 94 96  Resp: 20 20  Temp: 97.9 F (36.6 C) 98.4 F (36.9 C)  SpO2: 94% 98%    History of present illness:   Kenneth Harper a 79 y.o.malewithhistory of memory loss and hypercholesterolemia who presents with numbness that started yesterday. States that around 1230 or one he was out of his house when he felt like he was unable to do for himself as he normally is, though is very vague about exactly what symptoms he felt. He states that he now feels almost completely back to normal. He was admitted to [facility] Course:  Principal Problem:   Hemorrhagic stroke [facility]) Active Problems:   ICH (intracerebral hemorrhage) ([place])   Memory loss   Dyslipidemia   Elevated blood pressure reading   AKI (acute kidney injury) ([place])   Primary osteoarthritis of right knee  Subjective: August 05, 2020 Examined at bedside.  Patient sitting in the chair at the side of the bed in recliner denies any new complain.  Patient still has swelling and pain on his right knee which showed DJD and effusion August 04, 2020: Patient seen and examined at bedside.  His son Kenneth Harper is at bedside.  Wanted to know what the results of the x-ray showed  August 03, 2020: Patient  seen and examined at bedside.  Nurse had reported patient was having pain to the right knee which made it difficult to walk.  Assessment/Plan: Active Problems:   ICH (intracerebral hemorrhage) (HCC)   Hemorrhagic stroke (H[facility])   Memory loss   Dyslipidemia   Elevated blood pressure reading   AKI (acute kidney injury) ([place])   Primary osteoarthritis of right knee  #1 intracranial hemorrhage neurology consulted.  No antiplatelet or anticoagulation Neurology has signed off today they recommend inpatient rehab.  Consult for inpatient rehab ordered  2.  Hypertension we will allow permissive blood pressure due to history of intracranial hemorrhage.Continue amlodipine.  He was on Cardene drip yesterday but that has been discontinued  3.  Right knee pain.  This decreased his mobility and ability to participate in physical therapy today.  X-ray will be obtained, orthopedic consult will be obtained.  Continue physical therapy X-ray shows severe osteoarthritis and large knee effusion.  I spoke with orthopedic regarding the knee effusion, he advised conservative management and will see him as outpatient he stated he just got some steroid injection about a month ago as outpatient.  Suggested adding steroid and a brace.Marland Kitchen  He will be started on meloxicam for arthritis and I will add 5-day course of prednisone and Ace wrap. He will follow with the orthopedic as outpatient  4.  Acute kidney injury his current creatinine was 3.8 EGFR of 52.  his creatinine has improved to 1.27 today  5.  Abnormal glucose level . his last hemoglobin A1c was 5.8  6. Hyperlipidemia continue on atorvastatin  Code Status: Full  Procedures:  NON  Consultations:  Neurology  Orthopedic  Discharge Exam: BP 126/80 (BP Location: Right Arm)   Pulse 96   Temp 98.4 F (36.9 C) (Oral)   Resp 20   Ht $R'5\' 11"'yx$  (1.803 m)   Wt 104.3 kg   SpO2 98%   BMI 32.08 kg/m   General: Obese male in no acute  distress Cardiovascular: Regular rate and rhythm no murmur Respiratory: Clear to auscultation bilaterally Extremity with no edema Musculoskeletal: Gait needs assistance**   Discharge Instructions You were cared for by a hospitalist during your hospital stay. If you have any questions about your discharge medications or the care you received while you were in the hospital after you are discharged, you can call the unit and asked to speak with the hospitalist on call if the hospitalist that took care of you is not available. Once you are discharged, your primary care physician will handle any further medical issues. Please note that NO REFILLS for any discharge medications will be authorized once you are discharged, as it is imperative that you return to your primary care physician (or establish a relationship with a primary care physician if you do not have one) for your aftercare needs so that they can reassess your need for medications and monitor your lab values.  Discharge Instructions    Diet - low sodium heart healthy   Complete by: As directed    Increase activity slowly   Complete by: As directed      Allergies as of 08/06/2020   No Known Allergies     Medication List    TAKE these medications   amLODipine 5 MG tablet Commonly known as: NORVASC Take 1 tablet (5 mg total) by mouth daily. Start taking on: August 07, 2020   atorvastatin 40 MG tablet Commonly known as: LIPITOR Take 40 mg by mouth daily.   meloxicam 7.5 MG tablet Commonly known as: MOBIC Take 1 tablet (7.5 mg total) by mouth daily. Start taking on: August 07, 2020   pantoprazole 40 MG tablet Commonly known as: PROTONIX Take 1 tablet (40 mg total) by mouth at bedtime.   predniSONE 10 MG tablet Commonly known as: DELTASONE Take 1 tablet (10 mg total) by mouth daily with breakfast. Start taking on: August 07, 2020   senna-docusate 8.6-50 MG tablet Commonly known as: Senokot-S Take 1 tablet by mouth 2  (two) times daily.            Durable Medical Equipment  (From admission, onward)         Start     Ordered   08/06/20 1522  DME Walker  Once       Question Answer Comment  Walker: With [place]   Patient needs a walker to treat with the following condition Stroke ([place])      08/06/20 1524   08/06/20 1519  DME 3-in-1  Once        08/06/20 1524         No Known Allergies  Follow-up Information    Renette Butters, MD In 2 weeks.   Specialty: Orthopedic Surgery Contact information: [address] [place] [address] [phone]                The results of significant diagnostics from this hospitalization (including imaging, microbiology, ancillary and  laboratory) are listed below for reference.    Significant Diagnostic Studies: DG Knee 1-2 Views Right  Result Date: 08/03/2020 CLINICAL DATA:  80 year old male with history of trauma from a fall 5 days ago complaining of right anterior knee pain. EXAM: RIGHT KNEE - 1-2 VIEW COMPARISON:  06/21/2018. FINDINGS: No definite acute displaced fracture or dislocation. Severe joint space narrowing, subchondral sclerosis, subchondral cyst formation and osteophyte formation is again noted, most pronounced in the medial and patellofemoral compartments. Large suprapatellar joint effusion. Vascular calcifications noted in the medial aspect of the right thigh. IMPRESSION: 1. No acute displaced fracture or dislocation. 2. Large suprapatellar joint effusion. 3. Severe tricompartmental osteoarthritis, most pronounced in the medial and patellofemoral compartments. Electronically Signed   By: Vinnie Langton M.D.   On: 08/03/2020 14:25   CT Head Wo Contrast  Result Date: 07/31/2020 CLINICAL DATA:  Head trauma.  Left-sided facial droop. EXAM: CT HEAD WITHOUT CONTRAST TECHNIQUE: Contiguous axial images were obtained from the base of the skull through the vertex without intravenous contrast. COMPARISON:  Brain  MRI 04/12/2018 FINDINGS: Brain: Acute hemorrhage in the left thalamus, 23 x 35 x 14 mm. Small volume intraventricular extension in the dependent lateral ventricles. No hydrocephalus. No visible infarct, mass, or extra-axial collection. Vascular: No hyperdense vessel or unexpected calcification. Skull: Normal. Negative for fracture or focal lesion. Sinuses/Orbits: No evidence of injury.  Bilateral cataract resection Other: Critical Value/emergent results were called by telephone at the time of interpretation on 07/31/2020 at 10:49 am to provider Tyrone Nine, who verbally acknowledged these results. IMPRESSION: 6 cc acute hematoma in the left thalamus. Small volume intraventricular extension without hydrocephalus. Electronically Signed   By: Monte Fantasia M.D.   On: 07/31/2020 10:53   MR BRAIN WO CONTRAST  Result Date: 07/31/2020 CLINICAL DATA:  Follow-up examination for acute stroke. EXAM: MRI HEAD WITHOUT CONTRAST TECHNIQUE: Multiplanar, multiecho pulse sequences of the brain and surrounding structures were obtained without intravenous contrast. COMPARISON:  Prior head CT from earlier the same day as well as previous MRI from 04/12/2018. FINDINGS: Brain: Generalized age-related cerebral atrophy. Patchy T2/FLAIR hyperintensity within the periventricular and deep white matter both cerebral hemispheres most consistent with chronic small vessel ischemic disease, stable. Previously identified acute hemorrhage centered at the left thalamus again seen, overall size and morphology is not significantly changed with the hemorrhage measuring 1.6 x 2.3 x 2.7 cm (estimated volume 5 cc). Mild surrounding vasogenic edema without significant regional mass effect. Associated intraventricular extension with blood seen layering within the occipital horns of both lateral ventricles. Stable ventricular size and morphology without hydrocephalus. Additional scattered siderosis noted about the cerebellum, likely related to redistribution.  No other acute intracranial hemorrhage. Few additional chronic micro hemorrhages noted at the left cerebellar vermis (series 11, image 16) and subcortical right parieto-occipital region (series 11, image 35), suspected to be hypertensive in nature. No other foci of restricted diffusion to suggest acute or subacute ischemia. No other areas of encephalomalacia to suggest chronic cortical infarction. No mass lesion or significant midline shift. No extra-axial fluid collection. No made of a partially empty sella. Midline structures intact. Vascular: Major intracranial vascular flow voids are well maintained and normal. Skull and upper cervical spine: Craniocervical junction within normal limits. Upper cervical spine normal. Bone marrow signal intensity within normal limits. No scalp soft tissue abnormality. Sinuses/Orbits: Patient status post bilateral ocular lens replacement. Globes and orbital soft tissues demonstrate no acute finding. Paranasal sinuses are largely clear. No significant mastoid effusion. Inner ear structures grossly  normal. Other: None. IMPRESSION: 1. No significant interval change in size and morphology of acute hemorrhage centered at the left thalamus. Mild surrounding vasogenic edema without significant regional mass effect. Associated intraventricular extension without hydrocephalus. 2. No other acute intracranial abnormality. 3. Age-related cerebral atrophy with mild chronic small vessel ischemic disease. Electronically Signed   By: Rise Mu M.D.   On: 07/31/2020 22:57   DG CHEST PORT 1 VIEW  Result Date: 08/04/2020 CLINICAL DATA:  Headache and fever EXAM: PORTABLE CHEST 1 VIEW COMPARISON:  04/05/2018 FINDINGS: The heart size and mediastinal contours are within normal limits. Both lungs are clear. The visualized skeletal structures are unremarkable. IMPRESSION: No active disease. Electronically Signed   By: Katherine Mantle M.D.   On: 08/04/2020 20:41   ECHOCARDIOGRAM  COMPLETE  Result Date: 08/02/2020    ECHOCARDIOGRAM REPORT   Patient Name:   Kenneth Harper [facility] Date of Exam: 08/02/2020 Medical Rec #:  [id number]       Height:       71.0 in Accession #:    [id number]      Weight:       230.0 lb Date of Birth:  1941-01-19       BSA:          2.238 m Patient Age:    14 years        BP:           109/63 mmHg Patient Gender: M               HR:           92 bpm. Exam Location:  Inpatient Procedure: 2D Echo, Cardiac Doppler and Color Doppler Indications:    Stroke 434.91 / I163.9  History:        Patient has no prior history of Echocardiogram examinations.                 Risk Factors:Dyslipidemia and GERD.  Sonographer:    Elmarie Shiley Dance Referring Phys: Tara.Kingdom MCNEILL P KIRKPATRICK IMPRESSIONS  1. Left ventricular ejection fraction, by estimation, is 55 to 60%. The left ventricle has normal function. The left ventricle has no regional wall motion abnormalities. Left ventricular diastolic parameters are consistent with Grade I diastolic dysfunction (impaired relaxation). Elevated left ventricular end-diastolic pressure.  2. Right ventricular systolic function is normal. The right ventricular size is normal. There is normal pulmonary artery systolic pressure.  3. The mitral valve is normal in structure. No evidence of mitral valve regurgitation. No evidence of mitral stenosis.  4. The aortic valve is normal in structure. There is mild calcification of the aortic valve. There is mild thickening of the aortic valve. Aortic valve regurgitation is not visualized. No aortic stenosis is present.  5. Aortic dilatation noted. There is mild dilatation of the ascending aorta, measuring 37 mm.  6. The inferior vena cava is dilated in size with >50% respiratory variability, suggesting right atrial pressure of 8 mmHg. FINDINGS  Left Ventricle: Left ventricular ejection fraction, by estimation, is 55 to 60%. The left ventricle has normal function. The left ventricle has no regional wall motion  abnormalities. The left ventricular internal cavity size was normal in size. There is  no left ventricular hypertrophy. Left ventricular diastolic parameters are consistent with Grade I diastolic dysfunction (impaired relaxation). Elevated left ventricular end-diastolic pressure. Right Ventricle: The right ventricular size is normal. No increase in right ventricular wall thickness. Right ventricular systolic function is normal. There is normal pulmonary artery systolic pressure.  The tricuspid regurgitant velocity is 1.50 m/s, and  with an assumed right atrial pressure of 8 mmHg, the estimated right ventricular systolic pressure is 45.4 mmHg. Left Atrium: Left atrial size was normal in size. Right Atrium: Right atrial size was normal in size. Pericardium: There is no evidence of pericardial effusion. Mitral Valve: The mitral valve is normal in structure. No evidence of mitral valve regurgitation. No evidence of mitral valve stenosis. Tricuspid Valve: The tricuspid valve is normal in structure. Tricuspid valve regurgitation is trivial. No evidence of tricuspid stenosis. Aortic Valve: The aortic valve is normal in structure. There is mild calcification of the aortic valve. There is mild thickening of the aortic valve. Aortic valve regurgitation is not visualized. No aortic stenosis is present. Pulmonic Valve: The pulmonic valve was normal in structure. Pulmonic valve regurgitation is not visualized. No evidence of pulmonic stenosis. Aorta: Aortic dilatation noted. There is mild dilatation of the ascending aorta, measuring 37 mm. Venous: The inferior vena cava is dilated in size with greater than 50% respiratory variability, suggesting right atrial pressure of 8 mmHg. IAS/Shunts: No atrial level shunt detected by color flow Doppler.  LEFT VENTRICLE PLAX 2D LVIDd:         3.70 cm  Diastology LVIDs:         2.70 cm  LV e' medial:    5.66 cm/s LV PW:         1.30 cm  LV E/e' medial:  13.9 LV IVS:        1.15 cm  LV e'  lateral:   7.29 cm/s LVOT diam:     2.00 cm  LV E/e' lateral: 10.8 LV SV:         64 LV SV Index:   29 LVOT Area:     3.14 cm  RIGHT VENTRICLE             IVC RV Basal diam:  2.30 cm     IVC diam: 2.30 cm RV S prime:     16.20 cm/s TAPSE (M-mode): 2.5 cm LEFT ATRIUM             Index       RIGHT ATRIUM           Index LA diam:        3.50 cm 1.56 cm/m  RA Area:     12.20 cm LA Vol (A2C):   61.6 ml 27.53 ml/m RA Volume:   24.90 ml  11.13 ml/m LA Vol (A4C):   36.0 ml 16.09 ml/m LA Biplane Vol: 49.1 ml 21.94 ml/m  AORTIC VALVE LVOT Vmax:   98.90 cm/s LVOT Vmean:  65.600 cm/s LVOT VTI:    0.205 m  AORTA Ao Root diam: 3.30 cm Ao Asc diam:  3.70 cm MITRAL VALVE                TRICUSPID VALVE MV Area (PHT): 2.56 cm     TR Peak grad:   9.0 mmHg MV Decel Time: 296 msec     TR Vmax:        150.00 cm/s MV E velocity: 78.50 cm/s MV A velocity: 111.00 cm/s  SHUNTS MV E/A ratio:  0.71         Systemic VTI:  0.20 m                             Systemic Diam: 2.00 cm Skeet Latch MD Electronically signed by  Skeet Latch MD Signature Date/Time: 08/02/2020/11:55:42 AM    Final     Microbiology: Recent Results (from the past 240 hour(s))  Resp Panel by RT-PCR (Flu A&B, Covid) Nasopharyngeal Swab     Status: None   Collection Time: 07/31/20 11:30 AM   Specimen: Nasopharyngeal Swab; Nasopharyngeal(NP) swabs in vial transport medium  Result Value Ref Range Status   SARS Coronavirus 2 by RT PCR NEGATIVE NEGATIVE Final    Comment: (NOTE) SARS-CoV-2 target nucleic acids are NOT DETECTED.  The SARS-CoV-2 RNA is generally detectable in upper respiratory specimens during the acute phase of infection. The lowest concentration of SARS-CoV-2 viral copies this assay can detect is 138 copies/mL. A negative result does not preclude SARS-Cov-2 infection and should not be used as the sole basis for treatment or other patient management decisions. A negative result may occur with  improper specimen collection/handling,  submission of specimen other than nasopharyngeal swab, presence of viral mutation(s) within the areas targeted by this assay, and inadequate number of viral copies(<138 copies/mL). A negative result must be combined with clinical observations, patient history, and epidemiological information. The expected result is Negative.  Fact Sheet for Patients:  EntrepreneurPulse.com.au  Fact Sheet for Healthcare Providers:  IncredibleEmployment.be  This test is no t yet approved or cleared by the [place] FDA and  has been authorized for detection and/or diagnosis of SARS-CoV-2 by FDA under an Emergency Use Authorization (EUA). This EUA will remain  in effect (meaning this test can be used) for the duration of the COVID-19 declaration under Section 564(b)(1) of the Act, 21 U.S.C.section 360bbb-3(b)(1), unless the authorization is terminated  or revoked sooner.       Influenza A by PCR NEGATIVE NEGATIVE Final   Influenza B by PCR NEGATIVE NEGATIVE Final    Comment: (NOTE) The Xpert Xpress SARS-CoV-2/FLU/RSV plus assay is intended as an aid in the diagnosis of influenza from Nasopharyngeal swab specimens and should not be used as a sole basis for treatment. Nasal washings and aspirates are unacceptable for Xpert Xpress SARS-CoV-2/FLU/RSV testing.  Fact Sheet for Patients: EntrepreneurPulse.com.au  Fact Sheet for Healthcare Providers: IncredibleEmployment.be  This test is not yet approved or cleared by the [place] FDA and has been authorized for detection and/or diagnosis of SARS-CoV-2 by FDA under an Emergency Use Authorization (EUA). This EUA will remain in effect (meaning this test can be used) for the duration of the COVID-19 declaration under Section 564(b)(1) of the Act, 21 U.S.C. section 360bbb-3(b)(1), unless the authorization is terminated or revoked.  Performed at [facility], [place] [address], [place], [place] [address]   MRSA PCR Screening     Status: None   Collection Time: 08/01/20 12:35 PM   Specimen: Nasal Mucosa; Nasopharyngeal  Result Value Ref Range Status   MRSA by PCR NEGATIVE NEGATIVE Final    Comment:        The GeneXpert MRSA Assay (FDA approved for NASAL specimens only), is one component of a comprehensive MRSA colonization surveillance program. It is not intended to diagnose MRSA infection nor to guide or monitor treatment for MRSA infections. Performed at [facility], [place] [address], [place], [place] [address]   Culture, blood (routine x 2)     Status: None (Preliminary result)   Collection Time: 08/04/20 10:37 PM   Specimen: BLOOD  Result Value Ref Range Status   Specimen Description BLOOD LEFT ANTECUBITAL  Final   Special Requests   Final    BOTTLES DRAWN  AEROBIC AND ANAEROBIC Blood Culture adequate volume   Culture   Final    NO GROWTH 2 DAYS Performed at [facility], [place] [address] [place], [place] [address]    Report Status PENDING  Incomplete  Culture, blood (routine x 2)     Status: None (Preliminary result)   Collection Time: 08/04/20 10:37 PM   Specimen: BLOOD LEFT HAND  Result Value Ref Range Status   Specimen Description BLOOD LEFT HAND  Final   Special Requests   Final    BOTTLES DRAWN AEROBIC AND ANAEROBIC Blood Culture adequate volume   Culture   Final    NO GROWTH 2 DAYS Performed at [facility], [place] [address], [place], [place] [address]    Report Status PENDING  Incomplete     Labs: Basic Metabolic Panel: Recent Labs  Lab 07/31/20 1112 08/02/20 1558 08/04/20 0952  NA 139 138 133*  K 4.0 4.3 3.7  CL 108 105 99  CO2 20* 25 22  GLUCOSE 124* 129* 215*  BUN $Re'15 16 13  'fyt$ CREATININE 1.35* 1.38* 1.27*  CALCIUM 8.9 8.9 8.9   Liver Function Tests: Recent Labs  Lab 07/31/20 1112 08/04/20 0952  AST 26 21  ALT 17 22  ALKPHOS 71 61  BILITOT 0.6 1.2  PROT 6.5 6.4*  ALBUMIN 3.5 2.9*   No  results for input(s): LIPASE, AMYLASE in the last 168 hours. No results for input(s): AMMONIA in the last 168 hours. CBC: Recent Labs  Lab 07/31/20 1112 08/02/20 1558  WBC 5.6 7.4  NEUTROABS 3.6 4.9  HGB 14.9 14.6  HCT 46.3 44.4  MCV 91.7 90.1  PLT 185 188   Cardiac Enzymes: No results for input(s): CKTOTAL, CKMB, CKMBINDEX, TROPONINI in the last 168 hours. BNP: BNP (last 3 results) No results for input(s): BNP in the last 8760 hours.  ProBNP (last 3 results) No results for input(s): PROBNP in the last 8760 hours.  CBG: No results for input(s): GLUCAP in the last 168 hours.     Signed:  [name], MD [facility] 08/06/2020, 3:26 PM

## 2020-08-06 NOTE — Progress Notes (Signed)
Patient arrived about 1700 to unit with son. Patient and family oriented to unit and rehab process. Fall safety and rehab plan reviewed. Call bell left within reach. No complaints of pain at this time

## 2020-08-06 NOTE — TOC Transition Note (Signed)
Transition of Care [facility]) - CM/SW Discharge Note   Patient Details  Name: Kenneth Harper MRN: [id number] Date of Birth: 12/16/1940  Transition of Care [facility]) CM/SW Contact:  Pollie Friar, RN Phone Number: 08/06/2020, 1:41 PM   Clinical Narrative:    Pt is discharging to CIR today. CM signing off.   Final next level of care: IP Rehab Facility Barriers to Discharge: No Barriers Identified   Patient Goals and CMS Choice        Discharge Placement                       Discharge Plan and Services                                     Social Determinants of Health (SDOH) Interventions     Readmission Risk Interventions No flowsheet data found.

## 2020-08-07 ENCOUNTER — Inpatient Hospital Stay (HOSPITAL_COMMUNITY): Payer: Medicare HMO

## 2020-08-07 ENCOUNTER — Inpatient Hospital Stay (HOSPITAL_COMMUNITY): Payer: Medicare HMO | Admitting: Occupational Therapy

## 2020-08-07 DIAGNOSIS — R7303 Prediabetes: Secondary | ICD-10-CM | POA: Diagnosis not present

## 2020-08-07 DIAGNOSIS — S8001XD Contusion of right knee, subsequent encounter: Secondary | ICD-10-CM

## 2020-08-07 DIAGNOSIS — E876 Hypokalemia: Secondary | ICD-10-CM

## 2020-08-07 DIAGNOSIS — I619 Nontraumatic intracerebral hemorrhage, unspecified: Secondary | ICD-10-CM | POA: Diagnosis not present

## 2020-08-07 DIAGNOSIS — S8001XA Contusion of right knee, initial encounter: Secondary | ICD-10-CM

## 2020-08-07 LAB — COMPREHENSIVE METABOLIC PANEL
ALT: 325 U/L — ABNORMAL HIGH (ref 0–44)
AST: 250 U/L — ABNORMAL HIGH (ref 15–41)
Albumin: 2.3 g/dL — ABNORMAL LOW (ref 3.5–5.0)
Alkaline Phosphatase: 66 U/L (ref 38–126)
Anion gap: 10 (ref 5–15)
BUN: 24 mg/dL — ABNORMAL HIGH (ref 8–23)
CO2: 23 mmol/L (ref 22–32)
Calcium: 8.9 mg/dL (ref 8.9–10.3)
Chloride: 105 mmol/L (ref 98–111)
Creatinine, Ser: 1.21 mg/dL (ref 0.61–1.24)
GFR, Estimated: >60 mL/min (ref >60)
Glucose, Bld: 160 mg/dL — ABNORMAL HIGH (ref 70–99)
Potassium: 3.4 mmol/L — ABNORMAL LOW (ref 3.5–5.1)
Sodium: 138 mmol/L (ref 135–145)
Total Bilirubin: 0.7 mg/dL (ref 0.3–1.2)
Total Protein: 5.8 g/dL — ABNORMAL LOW (ref 6.5–8.1)

## 2020-08-07 LAB — CBC WITH DIFFERENTIAL/PLATELET
Abs Immature Granulocytes: 0.01 10*3/uL (ref 0.00–0.07)
Basophils Absolute: 0 10*3/uL (ref 0.0–0.1)
Basophils Relative: 0 %
Eosinophils Absolute: 0.1 10*3/uL (ref 0.0–0.5)
Eosinophils Relative: 3 %
HCT: 39.8 % (ref 39.0–52.0)
Hemoglobin: 12.8 g/dL — ABNORMAL LOW (ref 13.0–17.0)
Immature Granulocytes: 0 %
Lymphocytes Relative: 33 %
Lymphs Abs: 1.7 10*3/uL (ref 0.7–4.0)
MCH: 28.8 pg (ref 26.0–34.0)
MCHC: 32.2 g/dL (ref 30.0–36.0)
MCV: 89.6 fL (ref 80.0–100.0)
Monocytes Absolute: 0.5 10*3/uL (ref 0.1–1.0)
Monocytes Relative: 10 %
Neutro Abs: 2.8 10*3/uL (ref 1.7–7.7)
Neutrophils Relative %: 54 %
Platelets: 231 10*3/uL (ref 150–400)
RBC: 4.44 MIL/uL (ref 4.22–5.81)
RDW: 13.3 % (ref 11.5–15.5)
WBC: 5.1 10*3/uL (ref 4.0–10.5)
nRBC: 0 % (ref 0.0–0.2)

## 2020-08-07 MED ORDER — POTASSIUM CHLORIDE CRYS ER 20 MEQ PO TBCR
30.0000 meq | EXTENDED_RELEASE_TABLET | Freq: Two times a day (BID) | ORAL | Status: AC
  Administered 2020-08-07 (×2): 30 meq via ORAL
  Filled 2020-08-07 (×2): qty 1

## 2020-08-07 NOTE — Evaluation (Signed)
Occupational Therapy Assessment and Plan  Patient Details  Name: Kenneth Harper MRN: [id number] Date of Birth: [birth year]  OT Diagnosis: cognitive deficits and unsteadiness on feet Rehab Potential: Rehab Potential (ACUTE ONLY): Good ELOS: 7 days   Today's Date: 08/07/2020 OT Individual Time: 1000-1100 OT Individual Time Calculation (min): 60 min     Hospital Problem: Principal Problem:   Thalamic hemorrhage with stroke (Progress) Active Problems:   Hypokalemia   Prediabetes   Contusion of right knee   Past Medical History:  Past Medical History:  Diagnosis Date  . Arthritis    knees and back  . Asthma    childhood  . Carpal tunnel syndrome   . Cataract   . Diverticulitis   . GERD (gastroesophageal reflux disease)   . Hypercholesterolemia   . Memory loss    Past Surgical History:  Past Surgical History:  Procedure Laterality Date  . CATARACT EXTRACTION, BILATERAL Bilateral   . COLONOSCOPY WITH PROPOFOL N/A 10/29/2014   Procedure: COLONOSCOPY WITH PROPOFOL;  Surgeon: Garlan Fair, MD;  Location: [facility];  Service: Endoscopy;  Laterality: N/A;  . EYE SURGERY    . LUMBAR LAMINECTOMY/DECOMPRESSION MICRODISCECTOMY N/A 10/01/2015   Procedure: LEFT L2-3 MICRODISCECTOMY;  Surgeon: Jessy Oto, MD;  Location: [place];  Service: Orthopedics;  Laterality: N/A;    Assessment & Plan Clinical Impression: Kenneth Harper is a 80 year old male with history of OA, memory loss who was admitted on 07/31/20 reports of numbness. Family reported history of fall with frontal HA the day prior to admission. UDS negative. CT head done revealing 6 cc acute hematoma in left thalamus with small intraventricular extension. Follow up MRI brain showed stable bleed with mild edema and age related atrophy. He was started on Cleveprex for BP control and was neurologically stable. 2D echo showed EF 55-60% with grade 1 DD. Dr. Leonie Man felt that bleed was hypertensive in nature and recommended SBP<160.     He did report right knee pain and X rays done revealing severe tricompartmental OA and large suprapatellar joint effusion. Ortho consulted and recommended knee brace for knee contusion with question of meniscus injury -->to follow up with Dr. Percell Miller after discharge. Patient with resultant right inattention with knee pain and staggering unsteady gait affecting ADLs and mobility.  Patient transferred to CIR on 08/06/2020 .    Patient currently requires supervision to [facility] with basic self-care skills secondary to decreased cardiorespiratoy endurance, decreased awareness, decreased problem solving, decreased safety awareness and decreased memory and decreased sitting balance, decreased standing balance and decreased balance strategies.  Prior to hospitalization, patient could complete BADLs and IADLs with independent .  Patient will benefit from skilled intervention to increase independence with basic self-care skills prior to discharge home with care partner.  Anticipate patient will require intermittent supervision and no further OT follow recommended.  OT - End of Session Activity Tolerance: Tolerates 30+ min activity with multiple rests Endurance Deficit: Yes Endurance Deficit Description: benefits from intermittent seated RBs during self care OT Assessment Rehab Potential (ACUTE ONLY): Good OT Patient demonstrates impairments in the following area(s): Balance;Safety;Cognition;Endurance OT Basic ADL's Functional Problem(s): Grooming;Bathing;Dressing;Toileting OT Transfers Functional Problem(s): Toilet;Tub/Shower OT Plan OT Intensity: Minimum of 1-2 x/day, 45 to 90 minutes OT Frequency: 5 out of 7 days OT Duration/Estimated Length of Stay: 7 days OT Treatment/Interventions: Balance/vestibular training;Discharge planning;Self Care/advanced ADL retraining;Therapeutic Activities;Cognitive remediation/compensation;Disease mangement/prevention;Functional mobility training;Patient/family  education;Therapeutic Exercise;DME/adaptive equipment instruction;UE/LE Strength taining/ROM;Psychosocial support OT Basic Self-Care Anticipated Outcome(s): mod I OT  Toileting Anticipated Outcome(s): mod I OT Bathroom Transfers Anticipated Outcome(s): mod I OT Recommendation Patient destination: Home Follow Up Recommendations: Other (comment) (intermittent supervision) Equipment Recommended: To be determined   OT Evaluation Precautions/Restrictions  Precautions Precautions: Fall Required Braces or Orthoses: Other Brace Other Brace: ortho recommending right knee brace; not arrived yet. Restrictions Weight Bearing Restrictions: No General Chart Reviewed: Yes Pain Pain Assessment Pain Scale: 0-10 Pain Score: 0-No pain Home Living/Prior Functioning Home Living Family/patient expects to be discharged to:: Private residence Living Arrangements: Spouse/significant other,Children Available Help at Discharge: Family,Available 24 hours/day,Available PRN/intermittently Type of Home: House Home Access: Stairs to enter Entrance Stairs-Number of Steps: 3 Entrance Stairs-Rails: None Home Layout: Able to live on main level with bedroom/bathroom,Multi-level Bathroom Toilet: Standard Additional Comments: 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 wife does not require physical (A) but is unable to manage more than supervision care. Son lives in town; unsure of availability for assist.  Lives With: Spouse IADL History Homemaking Responsibilities: No (pt has hired Arboriculturist once per month; per pt orders out for food mostly; splits Insurance claims handler with wife.) Mode of Transportation: Musician Occupation: Full time employment Prior Function Level of Independence: Independent with basic ADLs,Independent with homemaking with ambulation,Independent with gait,Independent with transfers  Able to Take Stairs?: Yes Driving: Yes Vocation: Full time  employment Vision Baseline Vision/History: Wears glasses Wears Glasses: Reading only Patient Visual Report: No change from baseline Vision Assessment?: No apparent visual deficits Perception  Perception: Within Functional Limits Praxis Praxis: Intact Cognition Overall Cognitive Status: Impaired/Different from baseline Arousal/Alertness: Awake/alert Orientation Level: Person;Place;Situation Person: Oriented Place: Oriented Situation: Oriented Year: 2022 Month: January Day of Week: Correct Memory: Impaired Memory Impairment: Storage deficit;Retrieval deficit Immediate Memory Recall: Sock;Blue;Bed Memory Recall Sock: Not able to recall Memory Recall Blue: Not able to recall Memory Recall Bed: Not able to recall Attention: Focused;Sustained;Alternating;Divided Focused Attention: Appears intact Sustained Attention: Appears intact Alternating Attention Impairment: Functional complex;Verbal complex Divided Attention: Appears intact Divided Attention Impairment: Verbal complex Awareness: Impaired Awareness Impairment: Anticipatory impairment Problem Solving: Impaired Problem Solving Impairment: Functional complex Safety/Judgment: Impaired (mild) Sensation Sensation Light Touch: Appears Intact Hot/Cold: Appears Intact Proprioception: Appears Intact Stereognosis: Not tested Coordination Gross Motor Movements are Fluid and Coordinated: Yes Fine Motor Movements are Fluid and Coordinated: Yes Motor  Motor Motor: Within Functional Limits  Trunk/Postural Assessment  Cervical Assessment Cervical Assessment: Within Functional Limits Thoracic Assessment Thoracic Assessment: Within Functional Limits Lumbar Assessment Lumbar Assessment: Within Functional Limits Postural Control Postural Control: Within Functional Limits  Balance Balance Balance Assessed: Yes Static Sitting Balance Static Sitting - Balance Support: No upper extremity supported Static Sitting - Level of  Assistance: 7: Independent Dynamic Sitting Balance Dynamic Sitting - Balance Support: No upper extremity supported Dynamic Sitting - Level of Assistance: 5: Stand by assistance Static Standing Balance Static Standing - Balance Support: No upper extremity supported Static Standing - Level of Assistance: 5: Stand by assistance Dynamic Standing Balance Dynamic Standing - Balance Support: No upper extremity supported Dynamic Standing - Level of Assistance: Other (comment) (CGA) Extremity/Trunk Assessment RUE Assessment RUE Assessment: Within Functional Limits LUE Assessment LUE Assessment: Within Functional Limits  Care Tool Care Tool Self Care Eating        Oral Care    Oral Care Assist Level: Set up assist    Bathing         Assist Level: Supervision/Verbal cueing    Upper Body Dressing(including orthotics)   What is the patient wearing?: Pull  over shirt   Assist Level: Set up assist    Lower Body Dressing (excluding footwear)   What is the patient wearing?: Underwear/pull up;Pants Assist for lower body dressing: Contact Guard/Touching assist    Putting on/Taking off footwear   What is the patient wearing?: Shoes;Non-skid slipper socks Assist for footwear: Set up assist       Care Tool Toileting Toileting activity   Assist for toileting: Contact Guard/Touching assist     Care Tool Bed Mobility Roll left and right activity        Sit to lying activity        Lying to sitting edge of bed activity         Care Tool Transfers Sit to stand transfer        Chair/bed transfer         Toilet transfer   Assist Level: Contact Guard/Touching assist (toilet using grab bars)     Care Tool Cognition Expression of Ideas and Wants Expression of Ideas and Wants: Some difficulty - exhibits some difficulty with expressing needs and ideas (e.g, some words or finishing thoughts) or speech is not clear   Understanding Verbal and Non-Verbal Content Understanding  Verbal and Non-Verbal Content: Usually understands - understands most conversations, but misses some part/intent of message. Requires cues at times to understand   Memory/Recall Ability *first 3 days only Memory/Recall Ability *first 3 days only: Current season;Location of own room;That he or she is in a hospital/hospital unit    Refer to Care Plan for [place] 1 OT Short Term Goal 1 (Week 1): STGs= LTGs due to ELOS  Recommendations for other services: None    Skilled Therapeutic Intervention ADL ADL Grooming: Setup Where Assessed-Grooming: Sitting at sink Upper Body Bathing: Setup Where Assessed-Upper Body Bathing: Sitting at sink Lower Body Bathing: Contact guard Where Assessed-Lower Body Bathing: Standing at sink Upper Body Dressing: Setup Where Assessed-Upper Body Dressing: Sitting at sink Lower Body Dressing: Contact guard Where Assessed-Lower Body Dressing: Sitting at sink;Standing at sink Toileting: Supervision/safety Where Assessed-Toileting: Editor, commissioning Method: Counselling psychologist: Grab bars Mobility  Bed Mobility Bed Mobility: Supine to Sit Supine to Sit: Supervision/Verbal cueing Transfers Sit to Stand: Supervision/Verbal cueing Stand to Sit: Supervision/Verbal cueing   Skilled Intervention:  Pt semi upright in bed, no c/o pain, agreeable to OT session.  Initial evaluation completed, educated pt regarding OT scope of practice, and collaborated with pt regarding OT scope of practice.  Pt completed self care and functional mobility per above levels of assist.  Pt seated in recliner at end of session, call bell in reach, seat belt alarm on.     Discharge Criteria: Patient will be discharged from OT if patient refuses treatment 3 consecutive times without medical reason, if treatment goals not met, if there is a change in medical status, if patient makes no progress towards goals or  if patient is discharged from hospital.  The above assessment, treatment plan, treatment alternatives and goals were discussed and mutually agreed upon: by patient  Ezekiel Slocumb 08/07/2020, 12:44 PM

## 2020-08-07 NOTE — Patient Care Conference (Signed)
Inpatient RehabilitationTeam Conference and Plan of Care Update Date: 08/07/2020   Time: 11:13 AM    Patient Name: Kenneth Harper      Medical Record Number: [id number]  Date of Birth: 11/23/1940 Sex: Male         Room/Bed: 6T01S/0F09N-23 Payor Info: Payor: [employer] MEDICARE / Plan: [employer] MEDICARE HMO / Product Type: *No Product type* /    Admit Date/Time:  08/06/2020  4:52 PM  Primary Diagnosis:  Thalamic hemorrhage with stroke [facility])  Hospital Problems: Principal Problem:   Thalamic hemorrhage with stroke [facility]) Active Problems:   Hypokalemia   Prediabetes   Contusion of right knee    Expected Discharge Date: Expected Discharge Date: 08/13/20  Team Members Present: Physician leading conference: Dr. Delice Lesch Care Coodinator Present: Dorien Chihuahua, RN, BSN, CRRN;Becky Dupree, LCSW Nurse Present: Pamella Pert) [place], LPN PT Present: Magda Kiel, PT OT Present: Leretha Pol, OT SLP Present: Charolett Bumpers, SLP PPS Coordinator present : Gunnar Fusi, SLP     Current Status/Progress Goal Weekly Team Focus  Bowel/Bladder             Swallow/Nutrition/ Hydration             ADL's             Mobility             Communication   Min A higher level word finding in conversation  Supervision A  education and word finding strategies   Safety/Cognition/ Behavioral Observations  Min A, mod A recall  Supervision A, min A recall  education, complex problem solving, anticipatory awareness, recall strategies, higher level attention   Pain             Skin               Discharge Planning:      Team Discussion: Medically stable except for CKD being monitored by MD. Continent of bowel and bladder with MASD to buttocks. Progress limited by delayed processing , STMD and poor safety awareness Patient on target to meet rehab goals: yes, currently supervision overall  *See Care Plan and progress notes for long and short-term goals.   Revisions to Treatment Plan:   Focus on high level executive functioning   Teaching Needs:   Current Barriers to Discharge: Decreased caregiver support   Possible Resolutions to Barriers: Patient has first floor set up in a three level home Recommend wife seek help to provide care/transportation     Medical Summary Current Status: Left handed with balance impairment secondary to L thalamic hemorrhage  Barriers to Discharge: Medical stability;Behavior;Decreased family/caregiver support   Possible Resolutions to [employer]: Therapies, follow labs - Cr, K+, follow CBGs/BP   Continued Need for Acute Rehabilitation Level of Care: The patient requires daily medical management by a physician with specialized training in physical medicine and rehabilitation for the following reasons: Direction of a multidisciplinary physical rehabilitation program to maximize functional independence : Yes Medical management of patient stability for increased activity during participation in an intensive rehabilitation regime.: Yes Analysis of laboratory values and/or radiology reports with any subsequent need for medication adjustment and/or medical intervention. : Yes   I attest that I was present, lead the team conference, and concur with the assessment and plan of the team.   Dorien Chihuahua B 08/07/2020, 3:51 PM

## 2020-08-07 NOTE — Progress Notes (Signed)
[facility] Individual Statement of Services  Patient Name:  Kenneth Harper  Date:  08/07/2020  Welcome to the [facility].  Our goal is to provide you with an individualized program based on your diagnosis and situation, designed to meet your specific needs.  With this comprehensive rehabilitation program, you will be expected to participate in at least 3 hours of rehabilitation therapies Monday-Friday, with modified therapy programming on the weekends.  Your rehabilitation program will include the following services:  Physical Therapy (PT), Occupational Therapy (OT), Speech Therapy (ST), 24 hour per day rehabilitation nursing, Care Coordinator, Rehabilitation Medicine, Nutrition Services and Pharmacy Services  Weekly team conferences will be held on wednesday to discuss your progress.  Your Inpatient Rehabilitation Care Coordinator will talk with you frequently to get your input and to update you on team discussions.  Team conferences with you and your family in attendance may also be held.  Expected length of stay: 5-7 days  Overall anticipated outcome: independent with device  Depending on your progress and recovery, your program may change. Your Inpatient Rehabilitation Care Coordinator will coordinate services and will keep you informed of any changes. Your Inpatient Rehabilitation Care Coordinator's name and contact numbers are listed  below.  The following services may also be recommended but are not provided by the [facility] will be made to provide these services after discharge if needed.  Arrangements include referral to agencies that provide these services.  Your insurance has been verified to be:  [employer] Your primary doctor is:  Wenda Low  Pertinent information  will be shared with your doctor and your insurance company.  Inpatient Rehabilitation Care Coordinator:  Ovidio Kin, [place] or Emilia Beck  Information discussed with and copy given to patient by: [name], 08/07/2020, 1:33 PM

## 2020-08-07 NOTE — Evaluation (Signed)
Physical Therapy Assessment and Plan  Patient Details  Name: Kenneth Harper MRN: [id number] Date of Birth: 07-23-1940  PT Diagnosis: Abnormality of gait, Impaired cognition and Muscle weakness Rehab Potential: Good ELOS: 5-7 days   Today's Date: 08/07/2020 PT Individual Time: 1400-1500 PT Individual Time Calculation (min): 60 min    Hospital Problem: Principal Problem:   Thalamic hemorrhage with stroke ([place]) Active Problems:   Hypokalemia   Prediabetes   Contusion of right knee   Past Medical History:  Past Medical History:  Diagnosis Date  . Arthritis    knees and back  . Asthma    childhood  . Carpal tunnel syndrome   . Cataract   . Diverticulitis   . GERD (gastroesophageal reflux disease)   . Hypercholesterolemia   . Memory loss    Past Surgical History:  Past Surgical History:  Procedure Laterality Date  . CATARACT EXTRACTION, BILATERAL Bilateral   . COLONOSCOPY WITH PROPOFOL N/A 10/29/2014   Procedure: COLONOSCOPY WITH PROPOFOL;  Surgeon: Garlan Fair, MD;  Location: [facility];  Service: Endoscopy;  Laterality: N/A;  . EYE SURGERY    . LUMBAR LAMINECTOMY/DECOMPRESSION MICRODISCECTOMY N/A 10/01/2015   Procedure: LEFT L2-3 MICRODISCECTOMY;  Surgeon: Jessy Oto, MD;  Location: [place];  Service: Orthopedics;  Laterality: N/A;    Assessment & Plan Clinical Impression:Kenneth Harper is a 80 year old male with history of OA, memory loss who was admitted on 07/31/20 reports of numbness. Family reported history of fall with frontal HA the day prior to admission. UDS negative. CT head done revealing 6 cc acute hematoma in left thalamus with small intraventricular extension. Follow up MRI brain showed stable bleed with mild edema and age related atrophy. He was started on Cleveprex for BP control and was neurologically stable. 2D echo showed EF 55-60% with grade 1 DD. Dr. Leonie Man felt that bleed was hypertensive in nature and recommended SBP<160.  He did report right  knee pain and X rays done revealing severe tricompartmental OA and large suprapatellar joint effusion. Ortho consulted and recommended knee brace for knee contusion with question of meniscus injury -->to follow up with Dr. Percell Miller after discharge. Patient with resultant right inattention with knee pain and staggering unsteady gait affecting ADLs and mobility. CIR recommended due to functional decline.  Patient transferred to CIR on 08/06/2020 .   Patient currently requires min with mobility secondary to muscle weakness, ataxia and decreased attention, decreased awareness and decreased memory.  Prior to hospitalization, patient was independent  with mobility and lived with Spouse in a House home.  Home access is 4Stairs to enter.  Patient will benefit from skilled PT intervention to maximize safe functional mobility and minimize fall risk for planned discharge home with intermittent assist.  Anticipate patient will benefit from follow up [facility] at discharge.  PT - End of Session Activity Tolerance: Tolerates 30+ min activity with multiple rests Endurance Deficit: Yes Endurance Deficit Description: seated rest breaks after ambulation/activities with some SOB noted, SpO2 99% HR 86 PT Assessment Rehab Potential (ACUTE/IP ONLY): Good PT Barriers to Discharge: Decreased caregiver support PT Barriers to Discharge Comments: wife unable to provide physical assist, pt eager to d/c to be with her at home PT Patient demonstrates impairments in the following area(s): Balance;Safety;Endurance;Motor PT Transfers Functional Problem(s): Bed Mobility;Bed to Chair;Car;Furniture;Floor PT Locomotion Functional Problem(s): Ambulation;Stairs PT Plan PT Intensity: Minimum of 1-2 x/day ,45 to 90 minutes PT Frequency: 5 out of 7 days PT Duration Estimated Length of Stay: 5-7  days PT Treatment/Interventions: Ambulation/gait training;Cognitive remediation/compensation;Balance/vestibular training;Community  reintegration;Neuromuscular re-education;Patient/family education;Stair training;Therapeutic Exercise;UE/LE Coordination activities;UE/LE Strength taining/ROM;Therapeutic Activities;Functional mobility training;DME/adaptive equipment instruction;Discharge planning PT Transfers Anticipated Outcome(s): mod I PT Locomotion Anticipated Outcome(s): mod I PT Recommendation Follow Up Recommendations: Home health PT Patient destination: Home Equipment Recommended: To be determined   PT Evaluation Precautions/Restrictions Precautions Precautions: Fall Required Braces or Orthoses: Other Brace Other Brace: ortho recommending right knee brace; not arrived yet. Restrictions Weight Bearing Restrictions: No General   Vital SignsTherapy Vitals Temp: 97.7 F (36.5 C) Pulse Rate: 80 Resp: 16 BP: 128/74 Patient Position (if appropriate): Sitting Oxygen Therapy SpO2: 99 % O2 Device: Room Air Pain Pain Assessment Pain Scale: 0-10 Pain Score: 0-No pain Home Living/Prior Functioning Home Living Living Arrangements: Spouse/significant other Available Help at Discharge: Family;Available 24 hours/day;Available PRN/intermittently Type of Home: House Home Access: Stairs to enter [employer] of Steps: 4 Entrance Stairs-Rails: None Home Layout: Able to live on main level with bedroom/bathroom;Multi-level Bathroom Toilet: Standard Additional Comments: 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 wife does not require physical (A) but is unable to manage more than supervision care. Son lives in town; unsure of availability for assist.  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, technical sales: Within [place]: Intact  Cognition Overall Cognitive Status: Impaired/Different from baseline Arousal/Alertness: Awake/alert Orientation Level: Oriented X4 Attention: Focused;Sustained;Alternating;Divided Focused Attention: Appears intact Sustained Attention: Appears intact Alternating Attention: Impaired Alternating Attention Impairment: Functional complex;Verbal complex Divided Attention: Appears intact Divided Attention Impairment: Verbal complex Memory: Impaired Memory Impairment: Storage deficit;Retrieval deficit Immediate Memory Recall: Sock;Blue;Bed Memory Recall Sock: Not able to recall Memory Recall Blue: Not able to recall Memory Recall Bed: Not able to recall Awareness: Impaired Awareness Impairment: Anticipatory impairment Problem Solving: Impaired Problem Solving Impairment: Functional complex Safety/Judgment: Impaired (mild) Sensation Sensation Light Touch: Appears Intact Hot/Cold: Appears Intact Proprioception: Appears Intact Stereognosis: Not tested Coordination Gross Motor Movements are Fluid and Coordinated: No Fine Motor Movements are Fluid and Coordinated: Yes Heel Shin Test: WFL, but some difficulty with toe tapping Motor  Motor Motor: Other (comment) Motor - Skilled Clinical Observations: mild decreased coordination, R side weakness   Trunk/Postural Assessment  Cervical Assessment Cervical Assessment: Within Functional Limits Thoracic Assessment Thoracic Assessment: Within Functional Limits Lumbar Assessment Lumbar Assessment: Within Functional Limits Postural Control Postural Control: Within Functional Limits  Balance Balance Balance Assessed: Yes Standardized Balance Assessment Standardized Balance Assessment: Timed Up and Go Test Timed Up and Go Test TUG: Normal TUG;Cognitive TUG Normal TUG (seconds): 19.09 Cognitive TUG (seconds): 20.66 Static Sitting  Balance Static Sitting - Balance Support: No upper extremity supported Static Sitting - Level of Assistance: 7: Independent Dynamic Sitting Balance Dynamic Sitting - Balance Support: No upper extremity supported Dynamic Sitting - Level of Assistance: 5: Stand by assistance Sitting balance - Comments: taking off and putting on shoes Static Standing Balance Static Standing - Balance Support: No upper extremity supported Static Standing - Level of Assistance: 5: Stand by assistance Dynamic Standing Balance Dynamic Standing - Balance Support: No upper extremity supported Dynamic Standing - Level of Assistance: 4: Min assist Extremity Assessment  RUE Assessment RUE Assessment: Within Functional Limits LUE Assessment LUE Assessment: Within Functional Limits RLE Assessment RLE Assessment: Exceptions to [facility] Active  Range of Motion (AROM) Comments: WFL with some edema noted in knee General Strength Comments: hip flexion 4/5, knee extension 4-/5, ankle DF 4/5 LLE Assessment Active Range of Motion (AROM) Comments: WFL, but noted foot deformity with severe hallux valgus first crossing under second toe and fallen arch General Strength Comments: hip flexion 4+/5, knee extension 4+/5, ankle DF 4-/5  Care Tool Care Tool Bed Mobility Roll left and right activity   Roll left and right assist level: Independent    Sit to lying activity   Sit to lying assist level: Supervision/Verbal cueing    Lying to sitting edge of bed activity   Lying to sitting edge of bed assist level: Supervision/Verbal cueing     Care Tool Transfers Sit to stand transfer   Sit to stand assist level: Supervision/Verbal cueing    Chair/bed transfer   Chair/bed transfer assist level: Contact Guard/Touching assist     Toilet transfer   Assist Level: Contact Guard/Touching assist (toilet using grab bars)    Car transfer   Car transfer assist level: Supervision/Verbal cueing      Care Tool Locomotion Ambulation    Assist level: Minimal Assistance - Patient > 75% Assistive device: No Device Max distance: 200  Walk 10 feet activity   Assist level: Minimal Assistance - Patient > 75% Assistive device: No Device   Walk 50 feet with 2 turns activity   Assist level: Minimal Assistance - Patient > 75% Assistive device: No Device  Walk 150 feet activity   Assist level: Minimal Assistance - Patient > 75% Assistive device: No Device  Walk 10 feet on uneven surfaces activity   Assist level: Minimal Assistance - Patient > 75% Assistive device: Other (comment) (rail)  Stairs   Assist level: Contact Guard/Touching assist Stairs assistive device: 2 hand rails Max number of stairs: 4  Walk up/down 1 step activity   Walk up/down 1 step (curb) assist level: Contact Guard/Touching assist Walk up/down 1 step or curb assistive device: 2 hand rails    Walk up/down 4 steps activity Walk up/down 4 steps assist level: Contact Guard/Touching assist Walk up/down 4 steps assistive device: 2 hand rails  Walk up/down 12 steps activity Walk up/down 12 steps activity did not occur: Safety/medical concerns      Pick up small objects from floor   Pick up small object from the floor assist level: Contact Guard/Touching assist Pick up small object from the floor assistive device: tape roll  Wheelchair Will patient use wheelchair at discharge?: No          Wheel 50 feet with 2 turns activity      Wheel 150 feet activity        Refer to Care Plan for Long Term Goals  SHORT TERM GOAL WEEK 1 PT Short Term Goal 1 (Week 1): STG=LTG due to ELOS  Recommendations for other services: Neuropsych  Skilled Therapeutic Intervention Patient seated in recliner and agreeable to PT.  Reports not remembering prior SLP session today.  Patient seen for evaluation as noted below.  Performed gait training with and without RW due to noting flexed posture increased proximity to RW and heavy UE support despite cues.  More upright  posture with arm swing without RW, but needing min A at times due to veering R and some decreased awareness as well as decreased balance.  Completed TUG within 19 seconds demonstrating fall risk.  Patient educated need for device for decreased fall risk and plans for formal balance assessment to  determine safest assistive device.  Patient voiced concern for d/c next week preferring weekend and discussed depends on progress, but pt will not be allowed to drive till cleared by MD and he voiced understanding, but feel she needs to be home for his wife.  BP checked due to prior BP parameters for keeping SBP 160 or below, noted 621 systolic.  Patient negotiated 4 steps with rails and CGA, reports no rails on steps in garage at home.  Patient assisted without device to room and left at EOB with bed alarm on and needs in reach. Mobility Bed Mobility Bed Mobility: Rolling Right;Rolling Left;Right Sidelying to Sit;Sit to Supine Rolling Right: Independent Rolling Left: Independent Right Sidelying to Sit: Supervision/Verbal cueing Supine to Sit: Supervision/Verbal cueing Sit to Supine: Supervision/Verbal cueing Transfers Transfers: Sit to Stand;Stand to Sit Sit to Stand: Supervision/Verbal cueing Stand to Sit: Contact Guard/Touching assist Stand Pivot Transfers: Contact Guard/Touching assist Transfer (Assistive device): Rolling walker Locomotion  Gait Ambulation: Yes Gait Assistance: Minimal Assistance - Patient > 75% Gait Distance (Feet): 200 Feet Assistive device: None;Rolling walker Gait Assistance Details: Verbal cues for technique;Verbal cues for precautions/safety Gait Assistance Details: initially with RW, cues for posture, proximity, decreased UE support, then no device CGA to min A for balance/safety veering to R at times Gait Gait: Yes Gait Pattern: Impaired Gait Pattern: Step-through pattern;Decreased step length - right;Decreased weight shift to left;Trunk flexed Stairs / Additional  Locomotion Stairs: Yes Stairs Assistance: Contact Guard/Touching assist Stair Management Technique: Two rails;Forwards Number of Stairs: 4 Height of Stairs: 6 Ramp: Minimal Assistance - Patient >75% Wheelchair Mobility Wheelchair Mobility: No   Discharge Criteria: Patient will be discharged from PT if patient refuses treatment 3 consecutive times without medical reason, if treatment goals not met, if there is a change in medical status, if patient makes no progress towards goals or if patient is discharged from hospital.  The above assessment, treatment plan, treatment alternatives and goals were discussed and mutually agreed upon: by patient  Jamison Oka, PT 08/07/2020, 3:01 PM

## 2020-08-07 NOTE — Progress Notes (Signed)
Patient with exponential rise in LFTs--question Meloxicam v/s steroids effect. Will d/c lipitor and recheck in am.

## 2020-08-07 NOTE — Progress Notes (Signed)
Inpatient Rehabilitation Care Coordinator Assessment and Plan Patient Details  Name: Kenneth Harper MRN: [id number] Date of Birth: Jun 15, 1941  Today's Date: 08/07/2020  Hospital Problems: Principal Problem:   Thalamic hemorrhage with stroke [facility]) Active Problems:   Hypokalemia   Prediabetes   Contusion of right knee  Past Medical History:  Past Medical History:  Diagnosis Date  . Arthritis    knees and back  . Asthma    childhood  . Carpal tunnel syndrome   . Cataract   . Diverticulitis   . GERD (gastroesophageal reflux disease)   . Hypercholesterolemia   . Memory loss    Past Surgical History:  Past Surgical History:  Procedure Laterality Date  . CATARACT EXTRACTION, BILATERAL Bilateral   . COLONOSCOPY WITH PROPOFOL N/A 10/29/2014   Procedure: COLONOSCOPY WITH PROPOFOL;  Surgeon: Garlan Fair, MD;  Location: [facility];  Service: Endoscopy;  Laterality: N/A;  . EYE SURGERY    . LUMBAR LAMINECTOMY/DECOMPRESSION MICRODISCECTOMY N/A 10/01/2015   Procedure: LEFT L2-3 MICRODISCECTOMY;  Surgeon: Jessy Oto, MD;  Location: [place];  Service: Orthopedics;  Laterality: N/A;   Social History:  reports that he quit smoking about 9 years ago. His smoking use included cigars. He quit after 42.00 years of use. He has never used smokeless tobacco. He reports that he does not drink alcohol and does not use drugs.  Family / Support Systems Marital Status: Married Patient Roles: Spouse,Parent,Other (Comment) (employee) Spouse/Significant Other: Cathy-wife  [phone] Children: mark-son [phone]  Cynthia-daughter [phone] Other Supports: Step son Anticipated Caregiver: Cathy and pt's children Ability/Limitations of Caregiver: Wife can only provide supervision level due to health issues of her own. Pt's children can only check on intermittently Caregiver Availability: 24/7 (supervision level) Family Dynamics: Close with all children but feels responsible for his wife  and her care. He transports to appointments and is there for her. He wants to go home by the weekend.  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 Return to Work Plans: Pt plans to return he drives to various locations and will need to be cleared y MD Public relations account executive Issues: No issues Guardian/Conservator: None-according to MD does not feel pt is able to make his own decisions while here, will look toward his wife and children to do this until he is deemed able   Abuse/Neglect Abuse/Neglect Assessment Can Be Completed: Yes Physical Abuse: Denies Verbal Abuse: Denies Sexual Abuse: Denies Exploitation of patient/patient's resources: Denies Self-Neglect: Denies  Emotional Status Pt's affect, behavior and adjustment status: Pt is motivated to go home by the weekend he feels his wife needs him and can not function without. Discussed if he needs help this will not help her. His son in the room said nothing and allowed Dad to verbalize Recent Psychosocial Issues: other health issues Psychiatric History: NO issues deferred depression screen due to pt seems to be coping appropriately at this time. Substance Abuse History: No issues  Patient / Family Perceptions, Expectations & Goals Pt/Family understanding of illness & functional limitations: Pt and son can explain his stroke and deficits, he feels he is much better. Both have spoken with the MD and feel they have a good understanding of his plan going forward. Premorbid pt/family roles/activities: Husband, father, employee, etc Anticipated changes in roles/activities/participation: resume Pt/family expectations/goals: Pt states: " I need to get home to  my wife."  Son states: " He has one subject on his mind."  Electronics engineer: None Premorbid Home Care/DME Agencies: None Transportation available at discharge: Self he feels he can drive at discharge-encouraged him to talk with his MD regarding this  Discharge Planning Living Arrangements: Spouse/significant other Support Systems: Spouse/significant other,Children,Friends/neighbors Type of Residence: Private residence Insurance underwriter Resources: Multimedia programmer (specify) ([employer] Medicare) Financial Resources: [place] Referred: No Living Expenses: Own Money Management: Patient,Spouse Does the patient have any problems obtaining your medications?: No Home Management: Both he and wife Patient/Family Preliminary Plans: Return home with wife who is able to be there but can not provide any physical care to him due to her own health issues. Both have children from separate marriages who can check on. Care Coordinator Barriers to Discharge: Decreased caregiver support Care Coordinator Anticipated Follow Up Needs: HH/OP  Clinical Impression Pt wanting to get home to wife feels she can not function without him. He does provide transportation and both have children from first marriages who will check on them at home. Encouraged him to discuss driving with MD. Discharge date currently 2/1 he wants to go sooner.   [name] 08/07/2020, 1:31 PM

## 2020-08-07 NOTE — Progress Notes (Addendum)
[place] PHYSICAL MEDICINE & REHABILITATION PROGRESS NOTE  Subjective/Complaints: Patient seen sitting up in bed this morning working with therapies. He states he slept well overnight.  ROS: Denies CP, SOB, N/V/D  Objective: Vital Signs: Blood pressure 135/77, pulse 80, temperature 98 F (36.7 C), resp. rate 18, height 5\' 10"  (1.778 m), weight 102.5 kg, SpO2 95 %. No results found. Recent Labs    08/07/20 0511  WBC 5.1  HGB 12.8*  HCT 39.8  PLT 231   Recent Labs    08/07/20 0511  NA 138  K 3.4*  CL 105  CO2 23  GLUCOSE 160*  BUN 24*  CREATININE 1.21  CALCIUM 8.9    Intake/Output Summary (Last 24 hours) at 08/07/2020 1043 Last data filed at 08/07/2020 0741 Gross per 24 hour  Intake 480 ml  Output 1375 ml  Net -895 ml        Physical Exam: BP 135/77 (BP Location: Left Arm)   Pulse 80   Temp 98 F (36.7 C)   Resp 18   Ht 5\' 10"  (1.778 m)   Wt 102.5 kg   SpO2 95%   BMI 32.43 kg/m  Constitutional: No distress . Vital signs reviewed. HENT: Normocephalic.  Atraumatic. Eyes: EOMI. No discharge. Cardiovascular: No JVD.  RRR. Respiratory: Normal effort.  No stridor.  Bilateral clear to auscultation. GI: Non-distended.  BS +. Skin: Warm and dry.  Intact. Psych: Normal mood.  Normal behavior. Musc: Right lower extremity edema with mild tenderness at and distal to the knee Neuro: Alert and oriented x3 Motor: 5/5 throughout, slightly weaker on right, however left-handed Able to follow simple motor commands.  Sensory deficits RLE>RUE.   Assessment/Plan: 1. Functional deficits which require 3+ hours per day of interdisciplinary therapy in a comprehensive inpatient rehab setting.  Physiatrist is providing close team supervision and 24 hour management of active medical problems listed below.  Physiatrist and rehab team continue to assess barriers to discharge/monitor patient progress toward functional and medical goals   Care Tool:  Bathing               Bathing assist       Upper Body Dressing/Undressing Upper body dressing   What is the patient wearing?: Hospital gown only    Upper body assist      Lower Body Dressing/Undressing Lower body dressing      What is the patient wearing?: [place] only (waiting on ot to work with [name])     Lower body assist       Toileting Toileting Toileting Activity did not occur (Probation officer and hygiene only):  (slight spillage with urinal)  Toileting assist Assist for toileting: Contact Guard/Touching assist     Transfers Chair/bed transfer  Transfers assist           Locomotion Ambulation   Ambulation assist              Walk 10 feet activity   Assist           Walk 50 feet activity   Assist           Walk 150 feet activity   Assist           Walk 10 feet on uneven surface  activity   Assist           Wheelchair     Assist               Wheelchair 50 feet with 2 turns  activity    Assist            Wheelchair 150 feet activity     Assist           Medical Problem List and Plan: 1. Left handed with balance impairment secondary to L thalamic hemorrhage  Begin CIR evaluations  Team conference today to discuss current and goals and coordination of care, home and environmental barriers, and discharge planning with nursing, case manager, and therapies. Please see conference note from today as well.  2.  Antithrombotics: -DVT/anticoagulation:  Mechanical: Sequential compression devices, below knee Bilateral lower extremities             -antiplatelet therapy: N/A 3. Right knee contusion/Pain Management:  D/ced meloxicam as SCr trending up.   Continue low dose prednisone with Tylenol #3 prn.    Monitor with increased exertion 4. Mood: LCSW to follow for for evaluation and support.              -antipsychotic agents: N/A 5. Neuropsych: This patient is not fully capable of making decisions on  his own behalf. 6. Skin/Wound Care: routine pressure relief measures.  7. Fluids/Electrolytes/Nutrition: Monitor I/Os. 8. HTN: Monitor BP tid  Monitor with increased mobility 9. Hyponatremia: Resolved  10. Prediabetes: Hgb A1c-5.8.   Monitor increase mobility 11. R knee swelling/effusion- severe DJD  See #3 12. Urinary frequency  Continue to monitor 13. Hypokalemia  Potassium 3.4 on 1/26  Supplemented x1 day  Continue to monitor 14. Likely CKD stage II  Creatinine 1.21 on 1/26  Continue to monitor  LOS: 1 days A FACE TO FACE EVALUATION WAS PERFORMED  Kenneth Harper Kenneth Harper 08/07/2020, 10:43 AM

## 2020-08-07 NOTE — IPOC Note (Signed)
Individualized overall Plan of Care [facility]) Patient Details Name: SUJAY GRUNDMAN MRN: [id number] DOB: 1941-01-03  Admitting Diagnosis: Thalamic hemorrhage with stroke [facility])  Hospital Problems: Principal Problem:   Thalamic hemorrhage with stroke ([place]) Active Problems:   Hypokalemia   Prediabetes   Contusion of right knee     Functional Problem List: Nursing Bladder,Skin Integrity,Pain,Safety  PT Balance,Safety,Endurance,Motor  OT Balance,Safety,Cognition,Endurance  SLP Cognition  TR         Basic ADL's: OT Grooming,Bathing,Dressing,Toileting     Advanced  ADL's: OT       Transfers: PT Bed Mobility,Bed to [place]  OT Toilet,Tub/Shower     Locomotion: PT Ambulation,Stairs     Additional Impairments: OT    SLP Social Cognition,Communication expression Problem Solving,Memory,Awareness,Attention  TR      Anticipated Outcomes Item Anticipated Outcome  Self Feeding    Swallowing      Basic self-care  mod I  Toileting  mod I   Bathroom Transfers mod I  Bowel/Bladder  mod I  Transfers  mod I  Locomotion  mod I  Communication  Supervision A  Cognition  Supervision- Min A  Pain  less than 3 out of 10  Safety/Judgment  min assist   Therapy Plan: PT Intensity: Minimum of 1-2 x/day ,45 to 90 minutes PT Frequency: 5 out of 7 days PT Duration Estimated Length of Stay: 5-7 days OT Intensity: Minimum of 1-2 x/day, 45 to 90 minutes OT Frequency: 5 out of 7 days OT Duration/Estimated Length of Stay: 7 days SLP Intensity: Minumum of 1-2 x/day, 30 to 90 minutes SLP Frequency: 3 to 5 out of 7 days SLP Duration/Estimated Length of Stay: 2/1    Team Interventions: Nursing Interventions Patient/Family Education,Bladder Management,Pain Management,Skin Care/Wound Management  PT interventions Ambulation/gait training,Cognitive remediation/compensation,Balance/vestibular training,Community reintegration,Neuromuscular re-education,Patient/family  education,Stair training,Therapeutic Exercise,UE/LE Coordination activities,UE/LE Strength taining/ROM,Therapeutic Activities,Functional mobility training,DME/adaptive equipment instruction,Discharge planning  OT Interventions Balance/vestibular training,Discharge planning,Self Care/advanced ADL retraining,Therapeutic Activities,Cognitive remediation/compensation,Disease mangement/prevention,Functional mobility training,Patient/family education,Therapeutic Exercise,DME/adaptive equipment instruction,UE/LE Strength taining/ROM,Psychosocial support  SLP Interventions Cognitive remediation/compensation,Cueing hierarchy,Internal/external aids,Functional tasks,Medication managment,Speech/Language facilitation,Patient/family education  TR Interventions    SW/CM Interventions Discharge Planning,Psychosocial Support,Patient/Family Education   Barriers to Discharge MD  Medical stability, Weight, and Behavior  Nursing      PT Decreased caregiver support wife unable to provide physical assist, pt eager to d/c to be with her at home  OT      SLP      SW Decreased caregiver support     Team Discharge Planning: Destination: PT-Home ,OT- Home , SLP-Home Projected Follow-up: PT-Home health PT, OT-  Other (comment) (intermittent supervision), SLP-24 hour supervision/assistance,Outpatient SLP (TDB intermittent supervision A) Projected Equipment Needs: PT-To be determined, OT- To be determined, SLP-None recommended by SLP Equipment Details: PT- , OT-  Patient/family involved in discharge planning: PT- Patient,  OT-Patient, SLP-Patient  MD ELOS: 3-5 days. Medical Rehab Prognosis:  Good Assessment: 80 year old male with history of OA, memory loss who was admitted on 07/31/20 reports of numbness. Family reported history of fall with frontal HA the day prior to admission. UDS negative. CT head done revealing 6 cc acute hematoma in left thalamus with small intraventricular extension. Follow up MRI brain showed  stable bleed with mild edema and age related atrophy. He was started on Cleveprex for BP control and was neurologically stable. 2D echo showed EF 55-60% with grade 1 DD. Dr. Leonie Man felt that bleed was hypertensive in nature and recommended SBP<160. He did report right knee pain and X  rays done revealing severe tricompartmental OA and large suprapatellar joint effusion. Ortho consulted and recommended knee brace for knee contusion with question of meniscus injury -->to follow up with Dr. Percell Miller after discharge. Patient with resultant right inattention with knee pain and staggering unsteady gait affecting ADLs and mobility. Will set goals for Supervision with PT/OT and Min A with SLP.    Due to the current state of emergency, patients may not be receiving their 3-hours of Medicare-mandated therapy.  See Team Conference Notes for weekly updates to the plan of care

## 2020-08-07 NOTE — Progress Notes (Signed)
Physical Therapy Session Note  Patient Details  Name: VAISHNAV DEMARTIN MRN: [id number] Date of Birth: June 06, 1941  Today's Date: 08/07/2020 PT Individual Time: 1630-1700 PT Individual Time Calculation (min): 30 min   Short Term Goals: Week 1:  PT Short Term Goal 1 (Week 1): STG=LTG due to ELOS  Skilled Therapeutic Interventions/Progress Updates:  Patient in supine and agreeable to PT.  S supine to sit and donned shoes at EOB reaching to floor with S.  Patient ambulated with RW and CGA cues for posture, decreased UE support and proximity x 200' Patient in gym to complete Berg balance assessment as noted below scored 44/56.  Patient educated on fall risk and practiced with cane x 90' with CGA progressing to S without veering or LOB.  Ambulated to room 200' with [facility] and S with mild R veering, but no LOB and no physical assist.  Patient left seated EOB with call bell/needs in reach and bed alarm active.   Therapy Documentation Precautions:  Precautions Precautions: Fall Required Braces or Orthoses: Other Brace Other Brace: ortho recommending right knee brace; not arrived yet. Restrictions Weight Bearing Restrictions: No Pain: Pain Assessment Pain Scale: 0-10 Pain Score: 0-No pain  Balance: Standardized Balance Assessment Standardized Balance Assessment: Berg Balance Test Berg Balance Test Sit to Stand: Able to stand without using hands and stabilize independently Standing Unsupported: Able to stand safely 2 minutes Sitting with Back Unsupported but Feet Supported on Floor or Stool: Able to sit safely and securely 2 minutes Stand to Sit: Sits safely with minimal use of hands Transfers: Able to transfer safely, minor use of hands Standing Unsupported with Eyes Closed: Able to stand 10 seconds safely Standing Ubsupported with Feet Together: Needs help to attain position but able to stand for 30 seconds with feet together From Standing, Reach Forward with Outstretched Arm: Can reach  forward >12 cm safely (5") From Standing Position, Pick up Object from Floor: Able to pick up shoe, needs supervision From Standing Position, Turn to Look Behind Over each Shoulder: Looks behind one side only/other side shows less weight shift Turn 360 Degrees: Needs close supervision or verbal cueing Standing Unsupported, Alternately Place Feet on Step/Stool: Able to stand independently and complete 8 steps >20 seconds Standing Unsupported, One Foot in Front: Able to plae foot ahead of the other independently and hold 30 seconds Standing on One Leg: Able to lift leg independently and hold 5-10 seconds Total Score: 44     Therapy/Group: Individual Therapy  Reginia Naas  Magda Kiel, PT 08/07/2020, 5:32 PM

## 2020-08-07 NOTE — Evaluation (Signed)
Speech Language Pathology Assessment and Plan  Patient Details  Name: Kenneth Harper MRN: [id number] Date of Birth: 1941/01/15  SLP Diagnosis: Speech and Language deficits;Cognitive Impairments  Rehab Potential: Good ELOS: 2/1    Today's Date: 08/07/2020 SLP Individual Time: 803-900     Hospital Problem: Principal Problem:   Thalamic hemorrhage with stroke ([place]) Active Problems:   Hypokalemia   Prediabetes   Contusion of right knee  Past Medical History:  Past Medical History:  Diagnosis Date  . Arthritis    knees and back  . Asthma    childhood  . Carpal tunnel syndrome   . Cataract   . Diverticulitis   . GERD (gastroesophageal reflux disease)   . Hypercholesterolemia   . Memory loss    Past Surgical History:  Past Surgical History:  Procedure Laterality Date  . CATARACT EXTRACTION, BILATERAL Bilateral   . COLONOSCOPY WITH PROPOFOL N/A 10/29/2014   Procedure: COLONOSCOPY WITH PROPOFOL;  Surgeon: Garlan Fair, MD;  Location: [facility];  Service: Endoscopy;  Laterality: N/A;  . EYE SURGERY    . LUMBAR LAMINECTOMY/DECOMPRESSION MICRODISCECTOMY N/A 10/01/2015   Procedure: LEFT L2-3 MICRODISCECTOMY;  Surgeon: Jessy Oto, MD;  Location: [place];  Service: Orthopedics;  Laterality: N/A;    Assessment / Plan / Recommendation Clinical Impression Kenneth Harper is a 80 year old male with history of OA, memory loss who was admitted on 07/31/20 reports of numbness. Family reported history of fall with frontal HA the day prior to admission. UDS negative. CT head done revealing 6 cc acute hematoma in left thalamus with small intraventricular extension. Follow up MRI brain showed stable bleed with mild edema and age related atrophy. He was started on Cleveprex for BP control and was neurologically stable. 2D echo showed EF 55-60% with grade 1 DD. Dr. Leonie Man felt that bleed was hypertensive in nature and recommended SBP<160.  He did report right knee pain and X rays done  revealing severe tricompartmental OA and large suprapatellar joint effusion. Ortho consulted and recommended knee brace for knee contusion with question of meniscus injury -->to follow up with Dr. Percell Miller after discharge. Patient with resultant right inattention with knee pain and staggering unsteady gait affecting ADLs and mobility. CIR recommended due to functional decline.   Pt presents with mild impairments, deficits include executive function/complex problem solving, alternating attention, short term recall, anticipatory awareness, higher level word finding, further impacted by short term recall and delayed processing. Pt supports baseline deficits in memory. Pt completed portions of the CLQT, limited by time restraint noting mild deficits in executive function and generative naming below normal limits. Pt attributed cognitive linguistic deficits to feelings of "slowness." Pt was working full time and cared for wife at home (medication/money/time management) and would benefit from skilled ST services in order to maximize functional independence and reduce burden of care, likely requiring supervision at discharge with continued outpatient skilled ST services.   Skilled Therapeutic Interventions          Skilled ST services focused on cognitive skills. SLP facilitated administration of cognitive linguistic formal assessment and provided education of results. SLP and pt collaborated to set goals for cognitive linguistic needs during length of stay. All questions answered to satisfaction.  Pt was left in room with call bell within reach and bed alarm set. SLP recommends to continue skilled services.  SLP Assessment  Patient will need skilled [facility] during CIR admission    Recommendations  Patient destination: Home Follow  up Recommendations: 24 hour supervision/assistance;Outpatient SLP (TDB intermittent supervision A) Equipment Recommended: None recommended by SLP    SLP  Frequency 3 to 5 out of 7 days   SLP Duration  SLP Intensity  SLP Treatment/Interventions 2/1  Minumum of 1-2 x/day, 30 to 90 minutes  Cognitive remediation/compensation;Cueing hierarchy;Internal/external aids;Functional tasks;Medication managment;Speech/Language facilitation;Patient/family education    Pain Pain Assessment Pain Scale: 0-10 Pain Score: 0-No pain  Prior Functioning Type of Home: House  Lives With: Spouse Available Help at Discharge: Family;Available 24 hours/day;Available PRN/intermittently Vocation: Full time employment  SLP Evaluation Cognition Overall Cognitive Status: Impaired/Different from baseline Arousal/Alertness: Awake/alert Orientation Level: Oriented X4 Focused Attention: Appears intact Sustained Attention: Appears intact Alternating Attention: Impaired Memory: Impaired Problem Solving: Impaired Safety/Judgment: Impaired (mild)  Comprehension Auditory Comprehension Overall Auditory Comprehension: Appears within functional limits for tasks assessed Yes/No Questions: Within Functional Limits Commands: Within Functional Limits Conversation: Complex Reading Comprehension Reading Status: Not tested Expression Expression Primary Mode of Expression: Verbal Verbal Expression Overall Verbal Expression: Impaired Initiation: No impairment Level of Generative/Spontaneous Verbalization: Conversation Naming: Impairment Confrontation: Within functional limits Divergent: 50-74% accurate Interfering Components: Attention Written Expression Dominant Hand: Left Oral Motor Oral Motor/Sensory Function Overall Oral Motor/Sensory Function: Within functional limits Motor Speech Overall Motor Speech: Appears within functional limits for tasks assessed  Care Tool Care Tool Cognition Expression of Ideas and Wants     Understanding Verbal and Non-Verbal Content     Memory/Recall Ability *first 3 days only      Short Term Goals: Week 1: SLP Short  Term Goal 1 (Week 1): STG=LTG due to short ELOS  Refer to Care Plan for Long Term Goals  Recommendations for other services: Neuropsych  Discharge Criteria: Patient will be discharged from SLP if patient refuses treatment 3 consecutive times without medical reason, if treatment goals not met, if there is a change in medical status, if patient makes no progress towards goals or if patient is discharged from hospital.  The above assessment, treatment plan, treatment alternatives and goals were discussed and mutually agreed upon: by patient  Ulah Olmo  [facility] 08/07/2020, 5:00 PM

## 2020-08-07 NOTE — Progress Notes (Signed)
Inpatient Rehabilitation  Patient information reviewed and entered into eRehab system by Caroleena Paolini M. Sonita Michiels, M.A., CCC/SLP, PPS Coordinator.  Information including medical coding, functional ability and quality indicators will be reviewed and updated through discharge.    

## 2020-08-08 ENCOUNTER — Inpatient Hospital Stay (HOSPITAL_COMMUNITY): Payer: Medicare HMO | Admitting: Speech Pathology

## 2020-08-08 ENCOUNTER — Inpatient Hospital Stay (HOSPITAL_COMMUNITY): Payer: Medicare HMO

## 2020-08-08 ENCOUNTER — Inpatient Hospital Stay (HOSPITAL_COMMUNITY): Payer: Medicare HMO | Admitting: Occupational Therapy

## 2020-08-08 DIAGNOSIS — T380X5A Adverse effect of glucocorticoids and synthetic analogues, initial encounter: Secondary | ICD-10-CM

## 2020-08-08 DIAGNOSIS — R739 Hyperglycemia, unspecified: Secondary | ICD-10-CM

## 2020-08-08 DIAGNOSIS — R7401 Elevation of levels of liver transaminase levels: Secondary | ICD-10-CM

## 2020-08-08 LAB — HEPATIC FUNCTION PANEL
ALT: 266 U/L — ABNORMAL HIGH (ref 0–44)
AST: 128 U/L — ABNORMAL HIGH (ref 15–41)
Albumin: 2.5 g/dL — ABNORMAL LOW (ref 3.5–5.0)
Alkaline Phosphatase: 65 U/L (ref 38–126)
Bilirubin, Direct: <0.1 mg/dL (ref 0.0–0.2)
Total Bilirubin: 0.6 mg/dL (ref 0.3–1.2)
Total Protein: 5.8 g/dL — ABNORMAL LOW (ref 6.5–8.1)

## 2020-08-08 MED ORDER — BLOOD PRESSURE CONTROL BOOK
Freq: Once | Status: AC
  Filled 2020-08-08: qty 1

## 2020-08-08 NOTE — Progress Notes (Signed)
Patient ID: Kenneth Harper, male   DOB: [birth year], 80 y.o.   MRN: [id number] Met with the patient to review role of the nurse CM and collaboration with the SW to facilitate preparation for discharge and address secondary stroke risks. Reviewed risks of HTN, HLD and stress with other concerns of K+ level @ 3.4 with supplements and albumin level of 2.3. Patient given information on DASH diet, potassium rich foods and protein rich foods to add to his diet. LDL at 72 and A1c is 5.8. Patient reported he and his wife eat lots of soups so they will need to modify diet a little due to sodium content in canned soup.Continue to follow along to discharge and review educational needs. No other questions or concerns noted at present. Margarito Liner

## 2020-08-08 NOTE — Progress Notes (Signed)
Occupational Therapy Session Note  Patient Details  Name: SHAUNN TACKITT MRN: [id number] Date of Birth: March 08, 1941  Today's Date: 08/08/2020 OT Individual Time: 1300-1415 OT Individual Time Calculation (min): 75 min    Short Term Goals: Week 1:  OT Short Term Goal 1 (Week 1): STGs= LTGs due to ELOS  Skilled Therapeutic Interventions/Progress Updates:    Pt sitting up in recliner with son present.  No c/o pain and agreeable to OT session.  Discussed with pt and son at length regarding initial dc planning and OT tentative recommendations for 24 hour supervision and assist with IADLs upon dc to home.  Pt then ambulated using cane to bathroom and toilet transfer/ toileted with CGA having continent episode of urine.  Pt ambulated toilet to tub chair with CGA using grab bars.  Pt completed UB bathing and dressing with supervision and LB bathing and dressing with CGA.  Pt ambulated back to bed and stand to sit with CGA using cane.  Sit to supine with supervision.  Call bell in reach, bed alarm on.  Therapy Documentation Precautions:  Precautions Precautions: Fall Required Braces or Orthoses: Other Brace Other Brace: ortho recommending right knee brace; not arrived yet. Restrictions Weight Bearing Restrictions: No   Therapy/Group: Individual Therapy  Ezekiel Slocumb 08/08/2020, 4:39 PM

## 2020-08-08 NOTE — Progress Notes (Signed)
[place] PHYSICAL MEDICINE & REHABILITATION PROGRESS NOTE  Subjective/Complaints: Patient seen sitting up in bed this AM.  Good sitting balance noted.  He states he slept well overnight.  He states he was hoping to go home this weekend, educated on safety and limitations in caring for wife (caregiver). He states he had a good first day of therapies yesterday.   ROS: Denies CP, SOB, N/V/D  Objective: Vital Signs: Blood pressure (!) 154/82, pulse 80, temperature 98.5 F (36.9 C), temperature source Oral, resp. rate 20, height 5\' 10"  (1.778 m), weight 102.5 kg, SpO2 95 %. No results found. Recent Labs    08/07/20 0511  WBC 5.1  HGB 12.8*  HCT 39.8  PLT 231   Recent Labs    08/07/20 0511  NA 138  K 3.4*  CL 105  CO2 23  GLUCOSE 160*  BUN 24*  CREATININE 1.21  CALCIUM 8.9    Intake/Output Summary (Last 24 hours) at 08/08/2020 1115 Last data filed at 08/08/2020 0645 Gross per 24 hour  Intake 240 ml  Output 800 ml  Net -560 ml        Physical Exam: BP (!) 154/82 (BP Location: Left Arm)   Pulse 80   Temp 98.5 F (36.9 C) (Oral)   Resp 20   Ht 5\' 10"  (1.778 m)   Wt 102.5 kg   SpO2 95%   BMI 32.43 kg/m   Constitutional: No distress . Vital signs reviewed. HENT: Normocephalic.  Atraumatic. Eyes: EOMI. No discharge. Cardiovascular: No JVD.  RRR. Respiratory: Normal effort.  No stridor.  Bilateral clear to auscultation. GI: Non-distended.  BS +. Skin: Warm and dry.  Intact. Psych: Normal mood.  Normal behavior. Musc:  Right lower extremity edema with mild tenderness at and distal to the knee, improving Neuro: Alert and oriented x3 Motor: 5/5 throughout, slightly weaker on right, however left-handed, stable Able to follow simple motor commands.  Sensory deficits RLE>RUE.   Assessment/Plan: 1. Functional deficits which require 3+ hours per day of interdisciplinary therapy in a comprehensive inpatient rehab setting.  Physiatrist is providing close team  supervision and 24 hour management of active medical problems listed below.  Physiatrist and rehab team continue to assess barriers to discharge/monitor patient progress toward functional and medical goals   Care Tool:  Bathing              Bathing assist Assist Level: Supervision/Verbal cueing     Upper Body Dressing/Undressing Upper body dressing   What is the patient wearing?: (P) Pull over shirt    Upper body assist Assist Level: Set up assist    Lower Body Dressing/Undressing Lower body dressing      What is the patient wearing?: Underwear/pull up,Pants     Lower body assist Assist for lower body dressing: Contact Guard/Touching assist     Toileting Toileting Toileting Activity did not occur (Clothing management and hygiene only):  (slight spillage with urinal)  Toileting assist Assist for toileting: Independent with assistive device Assistive Device Comment: urinal   Transfers Chair/bed transfer  Transfers assist     Chair/bed transfer assist level: Contact Guard/Touching assist     Locomotion Ambulation   Ambulation assist      Assist level: Minimal Assistance - Patient > 75% Assistive device: Walker-rolling Max distance: 200   Walk 10 feet activity   Assist     Assist level: Minimal Assistance - Patient > 75% Assistive device: No Device   Walk 50 feet activity   Assist  Assist level: Minimal Assistance - Patient > 75% Assistive device: No Device    Walk 150 feet activity   Assist    Assist level: Minimal Assistance - Patient > 75% Assistive device: No Device    Walk 10 feet on uneven surface  activity   Assist     Assist level: Minimal Assistance - Patient > 75% Assistive device: Other (comment) (rail)   Wheelchair     Assist Will patient use wheelchair at discharge?: No             Wheelchair 50 feet with 2 turns activity    Assist            Wheelchair 150 feet activity      Assist           Medical Problem List and Plan: 1. Left handed with balance impairment secondary to L thalamic hemorrhage  Continue CIR 2.  Antithrombotics: -DVT/anticoagulation:  Mechanical: Sequential compression devices, below knee Bilateral lower extremities             -antiplatelet therapy: N/A 3. Right knee contusion/Pain Management:  D/ced meloxicam as SCr trending up.   Continue low dose prednisone with Tylenol #3 prn.    Monitor with increased exertion 4. Mood: LCSW to follow for for evaluation and support.              -antipsychotic agents: N/A 5. Neuropsych: This patient is not fully capable of making decisions on his own behalf. 6. Skin/Wound Care: routine pressure relief measures.  7. Fluids/Electrolytes/Nutrition: Monitor I/Os. 8. HTN: Monitor BP tid  ?Trending up, will consider medications if persistent  Monitor with increased mobility 9. Hyponatremia: Resolved  10. Steroid induced hyperglyvemia on Prediabetes: Hgb A1c-5.8.   Elevated on 1/26  Monitor increase mobility 11. R knee swelling/effusion- severe DJD  See #3  Improving 12. Urinary frequency  Continue to monitor 13. Hypokalemia  Potassium 3.4 on 1/26, labs ordered for tomorrow  Supplemented x1 day  Continue to monitor 14. Likely CKD stage II  Creatinine 1.21 on 1/26  Continue to monitor 15. Transaminitis  LFTs elevated, but improving on 1/27, repeat labs tomorrow  Lipitor d/ced    LOS: 2 days A FACE TO FACE EVALUATION WAS PERFORMED  Elmer Boutelle Lorie Phenix 08/08/2020, 11:15 AM

## 2020-08-08 NOTE — Progress Notes (Signed)
Physical Therapy Session Note  Patient Details  Name: Kenneth Harper MRN: [id number] Date of Birth: 1940-07-14  Today's Date: 08/08/2020 PT Individual Time: 9518-8416 PT Individual Time Calculation (min): 73 min   Short Term Goals: Week 1:  PT Short Term Goal 1 (Week 1): STG=LTG due to ELOS  Skilled Therapeutic Interventions/Progress Updates:     Pt greeted seated in recliner, awake and agreeable to therapy. No reports of pain. Brief discussion held regarding DC planning, home safety training, and role of PT POC. Sit<>stand with CGA from recliner with SPC. He ambulated from his room to main therapy gym, ~1109ft, with CGA and SPC. Mild veering noted, especially with turns. Performed repeated sit<>stands with arms across chest to isolate lower extremities, completed with supervision from lowered mat table height. Dynamic standing balance with rebounder using light ball with feet apart, feet together, semi-tandem, and full tandem; progressing from CGA to requiring minA with full-tandem. Also performed standing chop/lift patterns and upward reaching with 6lb med ball with feet apart.  Stair training up/down x4 steps using SPC and R hand rail with CGA progressing to up/down x4 steps using SPC and no hand rail with minA. Step-to pattern and increased difficulty with descent > ascent. Cues throughout for sequencing and safety.   Standing there-ex in // bars with mirror for visual feedback, performed the following with CGA from therapist: -1x10 mini-squats -1x10 unilateral hip abduction, bilaterally -1x10 unilateral hamstring curls, bilaterally -1x10 heel raises  Ambulated from main therapy gym to ortho gym with [facility] and CGA to perform NMR with BITS system. He performed the trail making task and he was able to complete with 14 interruptions but 100% accuracy, using RUE for [facility]. He also completed NMR with geoboard drawing replication with 5 second flash time for challenging memory. He struggled with  this, especially with any drawing that was mildly complex. Pt reports his memory has gotten worse over the past 3 months and has trouble with both short and long term memory. For example, he was unable to recall the name of the street that he lives on.  Pt ambulated back to his room, >293ft, with CGA and SPC and he ended session seated in recliner with safety belt alarm on and his needs within reach. Pt pleased with interventions and progress during session.   Therapy Documentation Precautions:  Precautions Precautions: Fall Required Braces or Orthoses: Other Brace Other Brace: ortho recommending right knee brace; not arrived yet. Restrictions Weight Bearing Restrictions: No   Therapy/Group: Individual Therapy  Haidynn Almendarez P Nomar Broad PT 08/08/2020, 10:20 AM

## 2020-08-08 NOTE — Progress Notes (Signed)
Speech Language Pathology Daily Session Note  Patient Details  Name: Kenneth Harper MRN: [id number] Date of Birth: Sep 08, 1940  Today's Date: 08/08/2020 SLP Individual Time: 0805-0900 SLP Individual Time Calculation (min): 55 min  Short Term Goals: Week 1: SLP Short Term Goal 1 (Week 1): STG=LTG due to short ELOS  Skilled Therapeutic Interventions:   Patient seen to address cognitive function goals. Patient able to ambulate with RW from room to speech therapy office with supervision and some CGA. Patient was 60% accurate for completing ALFA test 'Solving Daily Math Problems' and for 'Reading Instructions' was 50% accurate. Patient was able to correct errors successfully when SLP showed him errored questions and provided minA. Patient demonstrating short term memory impairment but also some long term memory impairment, ie not able to recall city his daughter lives in. Patient did not demonstrate adequate recognition of errors during problem solving tasks. He continues to benefit from skilled SLP intervention to maximize cognitive function prior to discharge.  Pain Pain Assessment Pain Scale: 0-10 Pain Score: 0-No pain  Therapy/Group: Individual Therapy  Sonia Baller, MA, CCC-SLP Speech Therapy

## 2020-08-09 DIAGNOSIS — R0989 Other specified symptoms and signs involving the circulatory and respiratory systems: Secondary | ICD-10-CM

## 2020-08-09 DIAGNOSIS — T380X5A Adverse effect of glucocorticoids and synthetic analogues, initial encounter: Secondary | ICD-10-CM

## 2020-08-09 DIAGNOSIS — R7401 Elevation of levels of liver transaminase levels: Secondary | ICD-10-CM

## 2020-08-09 DIAGNOSIS — R739 Hyperglycemia, unspecified: Secondary | ICD-10-CM

## 2020-08-09 LAB — COMPREHENSIVE METABOLIC PANEL
ALT: 199 U/L — ABNORMAL HIGH (ref 0–44)
AST: 57 U/L — ABNORMAL HIGH (ref 15–41)
Albumin: 2.8 g/dL — ABNORMAL LOW (ref 3.5–5.0)
Alkaline Phosphatase: 74 U/L (ref 38–126)
Anion gap: 11 (ref 5–15)
BUN: 19 mg/dL (ref 8–23)
CO2: 26 mmol/L (ref 22–32)
Calcium: 9.3 mg/dL (ref 8.9–10.3)
Chloride: 102 mmol/L (ref 98–111)
Creatinine, Ser: 1.43 mg/dL — ABNORMAL HIGH (ref 0.61–1.24)
GFR, Estimated: 50 mL/min — ABNORMAL LOW (ref >60)
Glucose, Bld: 128 mg/dL — ABNORMAL HIGH (ref 70–99)
Potassium: 4 mmol/L (ref 3.5–5.1)
Sodium: 139 mmol/L (ref 135–145)
Total Bilirubin: 1 mg/dL (ref 0.3–1.2)
Total Protein: 6.6 g/dL (ref 6.5–8.1)

## 2020-08-09 LAB — CULTURE, BLOOD (ROUTINE X 2)
Culture: NO GROWTH
Culture: NO GROWTH
Special Requests: ADEQUATE
Special Requests: ADEQUATE

## 2020-08-09 NOTE — Plan of Care (Signed)
  Problem: Consults Goal: RH STROKE PATIENT EDUCATION Description: See Patient Education module for education specifics  Outcome: Progressing   Problem: RH BOWEL ELIMINATION Goal: RH STG MANAGE BOWEL WITH ASSISTANCE Description: STG Manage Bowel with mod I Assistance. Outcome: Progressing Goal: RH STG MANAGE BOWEL W/MEDICATION W/ASSISTANCE Description: STG Manage Bowel with Medication with mod I Assistance. Outcome: Progressing   Problem: RH BLADDER ELIMINATION Goal: RH STG MANAGE BLADDER WITH ASSISTANCE Description: STG Manage Bladder With mod I Assistance Outcome: Progressing   Problem: RH SKIN INTEGRITY Goal: RH STG SKIN FREE OF INFECTION/BREAKDOWN Description: No new skin breakdown this shift Outcome: Progressing Goal: RH STG MAINTAIN SKIN INTEGRITY WITH ASSISTANCE Description: STG Maintain Skin Integrity With mod I Assistance. Outcome: Progressing Goal: RH STG ABLE TO PERFORM INCISION/WOUND CARE W/ASSISTANCE Description: STG Able To Perform Incision/Wound Care With min Assistance. Outcome: Progressing   Problem: RH SAFETY Goal: RH STG ADHERE TO SAFETY PRECAUTIONS W/ASSISTANCE/DEVICE Description: STG Adhere to Safety Precautions With min Assistance/Device. Outcome: Progressing   Problem: RH PAIN MANAGEMENT Goal: RH STG PAIN MANAGED AT OR BELOW PT'S PAIN GOAL Description: Less than 3 out of 10 Outcome: Progressing   Problem: RH KNOWLEDGE DEFICIT Goal: RH STG INCREASE KNOWLEDGE OF HYPERTENSION Description: Pt will be able to demonstrate understanding of medication regimen, dietary and lifestyle modification to better control blood pressure with mod I assist using handouts and booklets provided.   Outcome: Progressing Goal: RH STG INCREASE KNOWLEGDE OF HYPERLIPIDEMIA Description: Patient and family will be able to manage HLD with medications and dietary modifications using handouts and educational materials with cues/reminders  Outcome: Progressing Goal: RH STG  INCREASE KNOWLEDGE OF STROKE PROPHYLAXIS Description: Pt will be able to demonstrate understanding of medication regimen, dietary and lifestyle modification to prevent stroke with min assist using handouts and booklets provided.   Outcome: Progressing   

## 2020-08-09 NOTE — Progress Notes (Signed)
Inpatient Rehabilitation Care Coordinator Discharge Note  The overall goal for the admission was met for:   Discharge location: Yes-HOME WITH WIFE WHO CAN PROVIDE SUPERVISION BUT NO PHYSICAL CARE  Length of Stay: Yes-7 DAYS  Discharge activity level: Yes-MOD/-SUPERVISION LEVEL  Home/community participation: Yes  Services provided included: MD, RD, PT, OT, SLP, RN, CM, Pharmacy and SW  Financial Services: Private Insurance: [place] offered to/list presented to:YES  Follow-up services arranged: Home Health: [facility] and Patient/Family has no preference for HH/DME agencies NO EQUIPMENT NEEDS  Comments (or additional information):SON HAS BEEN HERE FOR THERAPIES AND AWARE OF HOW HIS DAD IS DOING. PT WANTS TO DRIVE AND TOLD HIM TO TALK WITH MD REGARDING THIS. MAIN GOAL IS TO GET HOME WITH WIFE WHO HE FEELS NEEDS HIM HELP  Patient/Family verbalized understanding of follow-up arrangements: Yes  Individual responsible for coordination of the follow-up plan: [name] [phone]  Confirmed correct DME delivered: [name] 08/09/2020    Kenneth Harper, Kenneth Harper

## 2020-08-09 NOTE — Progress Notes (Signed)
Occupational Therapy Session Note  Patient Details  Name: [name] MRN: [id number] Date of Birth: 12/31/1940  Today's Date: 08/09/2020 OT Individual Time: 1000-1045 OT Individual Time Calculation (min): 45 min   Short Term Goals: Week 1:  OT Short Term Goal 1 (Week 1): STGs= LTGs due to ELOS  Skilled Therapeutic Interventions/Progress Updates:    Pt greeted seated in recliner and agreeable to OT treatment session. Pt declined BADLs this am. Pt ambulated to therapy day room with single point cane and CGA A. Intermittent min A for slight LOB when turning corners. Worked on standing balance/endurance with wii bowling activity. Pt needed CGA for balance with lateral sway when trying to figure out wii remote. Pt needed mod/max cues initially to understand the functions of wii remote, but with practice, was able to complete functions with min A. Pt tolerated standing for all 10 frames. Worked on dual task of conversing while ambulating with some minor LOBs requiring CGA/min A to correct. Pt returned to room and ambulated into bathroom. Pt able to manage clothing and left seated on commode with nurse tech notified of pt status.   Therapy Documentation Precautions:  Precautions Precautions: Fall Required Braces or Orthoses: Other Brace Other Brace: ortho recommending right knee brace; not arrived yet. Restrictions Weight Bearing Restrictions: No Pain: Pain Assessment Pain Scale: 0-10 Pain Score: 0-No pain   Therapy/Group: Individual Therapy  Valma Cava 08/09/2020, 10:50 AM

## 2020-08-09 NOTE — Progress Notes (Signed)
Occupational Therapy Session Note  Patient Details  Name: Kenneth Harper MRN: [id number] Date of Birth: [birth year]  Today's Date: 08/09/2020 OT Individual Time: 1120-1200 OT Individual Time Calculation (min): 40 min    Short Term Goals: Week 1:  OT Short Term Goal 1 (Week 1): STGs= LTGs due to ELOS  Skilled Therapeutic Interventions/Progress Updates:    Pt seen this session to focus on dynamic balance to improve  indep with self care. Pt used cane and ambulated to gym with occasional CGA. In gym, worked on [employer] of stability activity for 2 trials of 1 minute each working on weight shifting and ankle mobility strategy. trialed sit to stand from low arm chair without UE support but pt unable to do so without min A, so moved to elevated mat (barstool height) and he was able to sit to stand without UE support. Used this as a conditioning exercise where he held cane horizontally in B hands, stood and then reached cane overhead. After just 5-6 reps he would get fatigued with some shortness of breath.  Repeated 2 more sets for endurance. Standing holding small ball moving from hip to hip for gentle trunk rotation, stand to reaching to floor to simulate picking up dog bowls.  Spent time reviewing safe reaching strategies with hand and foot placement when reaching for bowls on floor or needing to get something out of a low cupboard.   Pt ambulated back to room with occ CGA but mostly S, walked into bathroom with S and then toileted with mod I.  Washed hands at sink and then sat in recliner to rest. Belt alarm on and all needs met.   Therapy Documentation Precautions:  Precautions Precautions: Fall Required Braces or Orthoses: Other Brace Other Brace: ortho recommending right knee brace; not arrived yet. Restrictions Weight Bearing Restrictions: No      Pain: Pain Assessment Pain Scale: 0-10 Pain Score: 0-No pain    Therapy/Group: Individual Therapy  [place] 08/09/2020,  1:14 PM

## 2020-08-09 NOTE — Progress Notes (Signed)
Physical Therapy Session Note  Patient Details  Name: Kenneth Harper MRN: [id number] Date of Birth: [birth year]  Today's Date: 08/09/2020 PT Individual Time: 0940-1003 + 1300-1414 + 1429-1500 PT Individual Time Calculation (min): 23 min + 74 min + 31 min  Short Term Goals: Week 1:  PT Short Term Goal 1 (Week 1): STG=LTG due to ELOS  Skilled Therapeutic Interventions/Progress Updates:   1st session:   Pt received sitting upright in recliner, awake and agreeable/motivated to participate in therapy. No reports of pain. He was able to recall the events of last nights [employer] and he was excited about their win.   Sit<>stand with CGA and SPC from recliner height with slight difficulty producing power and hip-hinge tendencies with forward flexed trunk. Ambulated with CGA and SPC from his room to dayroom gym, increased lateral unsteadiness present.   Performed repeated sit<>stands 1x10 from lowered mat table height, unsupported with arms across chest, focusing on BLE power.   NMR for dynamic standing balance with 4-square stepping strategies over hockey sticks, with no AD requiring minA for steadying. Increased difficulty stepping forward/backwards > sideways over obstacle.   NMR for dynamic standing balance while playing hockey with bean bag as hockey puck. Focusing on coordination, dynamic reactions, and lateral weight shifting.   Gait training x375ft with CGA and SPC with dual-task overlay for serial casting by 2's from 100 and by 3's from 100, and reverse order of months (December, November, October, etc). Decreased gait speed noted with dual-tasking but no formal LOB. He was also able to correctly count by 2's 100% and by 3's ~90%. Also required mod cues for completing months.   He ended session seated in recliner with safety belt alarm on, needs in reach.  2nd session: Pt greeted sitting in recliner, agreeable to therapy. No reports of pain. Son at bedside, preparing to  leave. Sit<>Stand with CGA and SPC from recliner. He ambulated within his room with CGA and SPC and hugged his son with good dynamic standing balance noted. Ambulated from his room to main therapy gym, ~147ft, with CGA and SPC, conversing throughout and increased unsteadiness with head turns and 180deg turns.   Performed repeated goblet squats with 6lb dumbbell, 2x8, from lowered mat table height. Cues for producing power in legs and forward weight shift to reduce reliance of back of legs on mat table.  Gait training with suitcase carry, 88ft with CGA and SPC with 6lb dumbbell in RUE + 5ft with CGA and SPC with 10lb dumbbell in RUE. Able to progress him with gait training with no AD x19ft with CGA + 29ft with CGA and 6lb dumbbell in RUE + 30ft with CGA and 10lb dumbbell in RUE with no cane. Performed lateral side stepping with no AD and 38ft L<>R with blue tape for visual aid, requiring CGA/minA for stability and increased difficulty side stepping R than L. A few minor LOB's while doing this requiring minA for correction. Further gait training with agility ladder with CGA/minA and SPC, focusing on stepping strategies and functional complex sequencing with coordination of lower extremities.   Performed stair training up/down 4 steps with minA and SPC and 1 hand rail + 8 steps (seated rest before attempting) with miNA and SPC and no hand rail. Educated on safety technique with sequencing with the cane and also educated on looking into having a hand rail installed at his home to improve safety with entrance/exiting the house. Pt verbalized understanding and was in agreement, saying he  would speak to his son to assist.   He ambulated back to his room with CGA and SPC, remained seated in recliner with safety belt alarm on and needs within reach. Pt pleased with interventions and thankful for services.   3rd session: Pt greeted seated in recliner, agreeable to therapy. No reports of pain. Sit<>stand with CGA  and SPC from recliner height and ambulated with CGA and SPC from his room to day room gym. Performed dynamic standing balance with wii bowling and wii tennis activity. He needed CGA for balance due to lateral sway while completing this and he needed mod/max cues for sequencing with Wii remote. However, with practice, he was able to complete functions with minA. Pt tolerated standing throughout the 10 frames of bowling + 4 games of tennis. Ambulated back to his room with CGA and SPC where he reported need to void. He required CGA while standing to void, continent of bladder. He ambulated with CGA and no AD back to his recliner where he remained seated in recliner with safety belt alarm on and needs in reach.   Therapy Documentation Precautions:  Precautions Precautions: Fall Required Braces or Orthoses: Other Brace Other Brace: ortho recommending right knee brace; not arrived yet. Restrictions Weight Bearing Restrictions: No  Therapy/Group: Individual Therapy  Alger Simons 08/09/2020, 7:45 AM

## 2020-08-09 NOTE — Progress Notes (Signed)
[facility] PHYSICAL MEDICINE & REHABILITATION PROGRESS NOTE  Subjective/Complaints: Patient seen sitting up in his chair this morning.  He states he slept well overnight.  He states he is doing the best he can and doing well overall.  ROS: Denies CP, SOB, N/V/D  Objective: Vital Signs: Blood pressure (!) 168/89, pulse 79, temperature 98 F (36.7 C), temperature source Oral, resp. rate 16, height 5\' 10"  (1.778 m), weight 102.5 kg, SpO2 100 %. No results found. Recent Labs    08/07/20 0511  WBC 5.1  HGB 12.8*  HCT 39.8  PLT 231   Recent Labs    08/07/20 0511 08/09/20 0758  NA 138 139  K 3.4* 4.0  CL 105 102  CO2 23 26  GLUCOSE 160* 128*  BUN 24* 19  CREATININE 1.21 1.43*  CALCIUM 8.9 9.3    Intake/Output Summary (Last 24 hours) at 08/09/2020 1025 Last data filed at 08/09/2020 0736 Gross per 24 hour  Intake 3120 ml  Output 2250 ml  Net 870 ml        Physical Exam: BP (!) 168/89 (BP Location: Left Arm)   Pulse 79   Temp 98 F (36.7 C) (Oral)   Resp 16   Ht 5\' 10"  (1.778 m)   Wt 102.5 kg   SpO2 100%   BMI 32.43 kg/m   Constitutional: No distress . Vital signs reviewed. HENT: Normocephalic.  Atraumatic. Eyes: EOMI. No discharge. Cardiovascular: No JVD.  RRR. Respiratory: Normal effort.  No stridor.  Bilateral clear to auscultation. GI: Non-distended.  BS +. Skin: Warm and dry.  Intact. Psych: Normal mood.  Normal behavior. Musc: Right lower extremity edema with mild tenderness at and distal to the knee, improving Neuro: Alert Motor: 5/5 throughout, slightly weaker on right, however left-handed, unchanged Able to follow simple motor commands.   Assessment/Plan: 1. Functional deficits which require 3+ hours per day of interdisciplinary therapy in a comprehensive inpatient rehab setting.  Physiatrist is providing close team supervision and 24 hour management of active medical problems listed below.  Physiatrist and rehab team continue to assess barriers  to discharge/monitor patient progress toward functional and medical goals   Care Tool:  Bathing              Bathing assist Assist Level: Supervision/Verbal cueing     Upper Body Dressing/Undressing Upper body dressing   What is the patient wearing?: Pull over shirt    Upper body assist Assist Level: Set up assist    Lower Body Dressing/Undressing Lower body dressing      What is the patient wearing?: Underwear/pull up,Pants     Lower body assist Assist for lower body dressing: Contact Guard/Touching assist     Toileting Toileting Toileting Activity did not occur (Clothing management and hygiene only):  (pt uses urinal)  Toileting assist Assist for toileting: Independent with assistive device Assistive Device Comment: Urinal   Transfers Chair/bed transfer  Transfers assist     Chair/bed transfer assist level: Contact Guard/Touching assist     Locomotion Ambulation   Ambulation assist      Assist level: Minimal Assistance - Patient > 75% Assistive device: Walker-rolling Max distance: 200   Walk 10 feet activity   Assist     Assist level: Minimal Assistance - Patient > 75% Assistive device: No Device   Walk 50 feet activity   Assist    Assist level: Minimal Assistance - Patient > 75% Assistive device: No Device    Walk 150 feet activity  Assist    Assist level: Minimal Assistance - Patient > 75% Assistive device: No Device    Walk 10 feet on uneven surface  activity   Assist     Assist level: Minimal Assistance - Patient > 75% Assistive device: Other (comment) (rail)   Wheelchair     Assist Will patient use wheelchair at discharge?: No             Wheelchair 50 feet with 2 turns activity    Assist            Wheelchair 150 feet activity     Assist           Medical Problem List and Plan: 1. Left handed with balance impairment secondary to L thalamic hemorrhage  Continue CIR 2.   Antithrombotics: -DVT/anticoagulation:  Mechanical: Sequential compression devices, below knee Bilateral lower extremities             -antiplatelet therapy: N/A 3. Right knee contusion/Pain Management:  D/ced meloxicam as SCr trending up.   Continue low dose prednisone with Tylenol #3 prn.    Controlled with meds on 1/28  Monitor with increased exertion 4. Mood: LCSW to follow for for evaluation and support.              -antipsychotic agents: N/A 5. Neuropsych: This patient is not fully capable of making decisions on his own behalf. 6. Skin/Wound Care: routine pressure relief measures.  7. Fluids/Electrolytes/Nutrition: Monitor I/Os. 8. HTN: Monitor BP tid  Labile on 1/28, monitor for trend  Monitor with increased mobility 9. Hyponatremia: Resolved  10. Steroid induced hyperglyvemia on Prediabetes: Hgb A1c-5.8.   Elevated, but?  Improving on 1/28  Monitor increase mobility 11. R knee swelling/effusion- severe DJD  See #3  Improving 12. Urinary frequency  Continue to monitor 13. Hypokalemia  Potassium 4.0 on 1/28  Supplemented x1 day on 1/26  Continue to monitor 14. 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 Transaminitis  LFTs elevated, but improving on 1/28  Lipitor d/ced, will challenge again next week    LOS: 3 days A FACE TO FACE EVALUATION WAS PERFORMED  Caley Ciaramitaro Lorie Phenix 08/09/2020, 10:25 AM

## 2020-08-09 NOTE — Progress Notes (Signed)
Speech Language Pathology Daily Session Note  Patient Details  Name: Kenneth Harper MRN: [id number] Date of Birth: [birth year]  Today's Date: 08/09/2020 SLP Individual Time: 1510-1540 SLP Individual Time Calculation (min): 30 min  Short Term Goals: Week 1: SLP Short Term Goal 1 (Week 1): STG=LTG due to short ELOS  Skilled Therapeutic Interventions:   Patient seen for skilled ST session to focus on cognitive goals. Patient oriented to month, year and date. When SLP provided some context cues, he was able to recall several activities completed with PT and OT during sessions today, but for one activity, he did not demonstrate any recall even when SLP fully described and demonstrated. Patient continues to benefit from skilled SLP intervention to maximize cognitive function goals prior to discharge home.   Pain Pain Assessment Pain Scale: 0-10 Pain Score: 0-No pain  Therapy/Group: Individual Therapy  Sonia Baller, MA, CCC-SLP Speech Therapy

## 2020-08-10 NOTE — Progress Notes (Signed)
[place] PHYSICAL MEDICINE & REHABILITATION PROGRESS NOTE  Subjective/Complaints: Discussed blood work including need to drink more.  ROS: Denies CP, SOB, N/V/D  Objective: Vital Signs: Blood pressure (!) 155/88, pulse 88, temperature 97.8 F (36.6 C), temperature source Oral, resp. rate 18, height 5\' 10"  (1.778 m), weight 102.5 kg, SpO2 98 %. No results found. No results for input(s): WBC, HGB, HCT, PLT in the last 72 hours. Recent Labs    08/09/20 0758  NA 139  K 4.0  CL 102  CO2 26  GLUCOSE 128*  BUN 19  CREATININE 1.43*  CALCIUM 9.3    Intake/Output Summary (Last 24 hours) at 08/10/2020 1212 Last data filed at 08/10/2020 0700 Gross per 24 hour  Intake 975 ml  Output 525 ml  Net 450 ml        Physical Exam: BP (!) 155/88 (BP Location: Right Arm)   Pulse 88   Temp 97.8 F (36.6 C) (Oral)   Resp 18   Ht 5\' 10"  (1.778 m)   Wt 102.5 kg   SpO2 98%   BMI 32.43 kg/m    General: No acute distress Mood and affect are appropriate Heart: Regular rate and rhythm no rubs murmurs or extra sounds Lungs: Clear to auscultation, breathing unlabored, no rales or wheezes Abdomen: Positive bowel sounds, soft nontender to palpation, nondistended Extremities: No clubbing, cyanosis, or edema Skin: No evidence of breakdown, no evidence of rash  Musc: Right lower extremity edema with mild tenderness at and distal to the knee, improving Neuro: Alert Motor: 5/5 throughout, slightly weaker on right, however left-handed, unchanged Able to follow simple motor commands.   Assessment/Plan: 1. Functional deficits which require 3+ hours per day of interdisciplinary therapy in a comprehensive inpatient rehab setting.  Physiatrist is providing close team supervision and 24 hour management of active medical problems listed below.  Physiatrist and rehab team continue to assess barriers to discharge/monitor patient progress toward functional and medical goals   Care  Tool:  Bathing    Body parts bathed by patient: Right arm,Left arm,Chest,Abdomen,Front perineal area,Buttocks,Right upper leg,Left upper leg,Right lower leg,Left lower leg,Face         Bathing assist Assist Level: Set up assist     Upper Body Dressing/Undressing Upper body dressing   What is the patient wearing?: Pull over shirt    Upper body assist Assist Level: Independent with assistive device    Lower Body Dressing/Undressing Lower body dressing      What is the patient wearing?: Pants     Lower body assist Assist for lower body dressing: Supervision/Verbal cueing     Toileting Toileting Toileting Activity did not occur (Clothing management and hygiene only):  (pt uses urinal)  Toileting assist Assist for toileting: Independent Assistive Device Comment: Urinal   Transfers Chair/bed transfer  Transfers assist     Chair/bed transfer assist level: Supervision/Verbal cueing     Locomotion Ambulation   Ambulation assist      Assist level: Minimal Assistance - Patient > 75% Assistive device: Walker-rolling Max distance: 200   Walk 10 feet activity   Assist     Assist level: Minimal Assistance - Patient > 75% Assistive device: No Device   Walk 50 feet activity   Assist    Assist level: Minimal Assistance - Patient > 75% Assistive device: No Device    Walk 150 feet activity   Assist    Assist level: Minimal Assistance - Patient > 75% Assistive device: No Device  Walk 10 feet on uneven surface  activity   Assist     Assist level: Minimal Assistance - Patient > 75% Assistive device: Other (comment) (rail)   Wheelchair     Assist Will patient use wheelchair at discharge?: No             Wheelchair 50 feet with 2 turns activity    Assist            Wheelchair 150 feet activity     Assist           Medical Problem List and Plan: 1. Left handed with balance impairment secondary to L thalamic  hemorrhage  Continue CIR PT, OT 2.  Antithrombotics: -DVT/anticoagulation:  Mechanical: Sequential compression devices, below knee Bilateral lower extremities             -antiplatelet therapy: N/A 3. Right knee contusion/Pain Management:  D/ced meloxicam as SCr trending up.   Continue low dose prednisone with Tylenol #3 prn.    Controlled with meds on 1/28  Monitor with increased exertion 4. Mood: LCSW to follow for for evaluation and support.              -antipsychotic agents: N/A 5. Neuropsych: This patient is not fully capable of making decisions on his own behalf. 6. Skin/Wound Care: routine pressure relief measures.  7. Fluids/Electrolytes/Nutrition: Monitor I/Os. 8. HTN: Monitor BP tid   Vitals:   08/10/20 0454 08/10/20 1000  BP: (!) 155/88   Pulse: 77 88  Resp: 18   Temp: 99.1 F (37.3 C) 97.8 F (36.6 C)  SpO2: 94% 98%   9. Hyponatremia: Resolved  10. Steroid induced hyperglyvemia on Prediabetes: Hgb A1c-5.8.   Elevated, but?  Improving on 1/28  Monitor increase mobility 11. R knee swelling/effusion- severe DJD  See #3  Improving 12. Urinary frequency  Continue to monitor 13. Hypokalemia improved  Potassium 4.0 on 1/28  Supplemented x1 day on 1/26  Continue to monitor 14. 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 Transaminitis  LFTs elevated, but improving on 1/28  Lipitor d/ced, will challenge again next week    LOS: 4 days A FACE TO FACE EVALUATION WAS PERFORMED  Charlett Blake 08/10/2020, 12:12 PM

## 2020-08-10 NOTE — Plan of Care (Signed)
  Problem: Consults Goal: RH STROKE PATIENT EDUCATION Description: See Patient Education module for education specifics  Outcome: Progressing   Problem: RH BOWEL ELIMINATION Goal: RH STG MANAGE BOWEL WITH ASSISTANCE Description: STG Manage Bowel with mod I Assistance. Outcome: Progressing Goal: RH STG MANAGE BOWEL W/MEDICATION W/ASSISTANCE Description: STG Manage Bowel with Medication with mod I Assistance. Outcome: Progressing   Problem: RH BLADDER ELIMINATION Goal: RH STG MANAGE BLADDER WITH ASSISTANCE Description: STG Manage Bladder With mod I Assistance Outcome: Progressing   Problem: RH SKIN INTEGRITY Goal: RH STG SKIN FREE OF INFECTION/BREAKDOWN Description: No new skin breakdown this shift Outcome: Progressing Goal: RH STG MAINTAIN SKIN INTEGRITY WITH ASSISTANCE Description: STG Maintain Skin Integrity With mod I Assistance. Outcome: Progressing Goal: RH STG ABLE TO PERFORM INCISION/WOUND CARE W/ASSISTANCE Description: STG Able To Perform Incision/Wound Care With min Assistance. Outcome: Progressing   Problem: RH SAFETY Goal: RH STG ADHERE TO SAFETY PRECAUTIONS W/ASSISTANCE/DEVICE Description: STG Adhere to Safety Precautions With min Assistance/Device. Outcome: Progressing   Problem: RH PAIN MANAGEMENT Goal: RH STG PAIN MANAGED AT OR BELOW PT'S PAIN GOAL Description: Less than 3 out of 10 Outcome: Progressing   Problem: RH KNOWLEDGE DEFICIT Goal: RH STG INCREASE KNOWLEDGE OF HYPERTENSION Description: Pt will be able to demonstrate understanding of medication regimen, dietary and lifestyle modification to better control blood pressure with mod I assist using handouts and booklets provided.   Outcome: Progressing Goal: RH STG INCREASE KNOWLEGDE OF HYPERLIPIDEMIA Description: Patient and family will be able to manage HLD with medications and dietary modifications using handouts and educational materials with cues/reminders  Outcome: Progressing Goal: RH STG  INCREASE KNOWLEDGE OF STROKE PROPHYLAXIS Description: Pt will be able to demonstrate understanding of medication regimen, dietary and lifestyle modification to prevent stroke with min assist using handouts and booklets provided.   Outcome: Progressing   

## 2020-08-10 NOTE — Progress Notes (Signed)
Physical Therapy Session Note  Patient Details  Name: Kenneth Harper MRN: [id number] Date of Birth: 1940-09-27  Today's Date: 08/10/2020 PT Individual Time: 1650-1743 PT Individual Time Calculation (min): 53 min   Short Term Goals: Week 1:  PT Short Term Goal 1 (Week 1): STG=LTG due to ELOS  Skilled Therapeutic Interventions/Progress Updates:   Pt received sitting in WC and agreeable to PT. Gait training through rehab unit with [facility] and supervision assist. No LOB throughout but min cues for improved heel contact R and L. 2 x 187ft.   Dynamic balance training to stand on airex pad while engaged in [employer]. Pt tolerated 1 frame sitting and 9 frames in standing. Supervision assist from PT to prevent posterior bias and LOB. Pt then completed Wii fit table tilt and penguin slide x 1 each with min assist from PT to improve COM control.   nustep reciprocal movement/cardiovascular  training x 8 min, level 7, cues for full ROM throughout from PT as well as attention to task.   Patient returned to room and performed stand pivot to recliner with [facility] and supervision assist. Pt left sitting in recliner with call bell in reach and all needs met.         Therapy Documentation Precautions:  Precautions Precautions: Fall Required Braces or Orthoses: Other Brace Other Brace: ortho recommending right knee brace; not arrived yet. Restrictions Weight Bearing Restrictions: No  Pain: denies   Therapy/Group: Individual Therapy  Lorie Phenix 08/10/2020, 5:44 PM

## 2020-08-10 NOTE — Plan of Care (Signed)
  Problem: Consults Goal: RH STROKE PATIENT EDUCATION Description: See Patient Education module for education specifics  Outcome: Progressing   Problem: RH BOWEL ELIMINATION Goal: RH STG MANAGE BOWEL WITH ASSISTANCE Description: STG Manage Bowel with mod I Assistance. Outcome: Progressing Goal: RH STG MANAGE BOWEL W/MEDICATION W/ASSISTANCE Description: STG Manage Bowel with Medication with mod I Assistance. Outcome: Progressing   Problem: RH BLADDER ELIMINATION Goal: RH STG MANAGE BLADDER WITH ASSISTANCE Description: STG Manage Bladder With mod I Assistance Outcome: Progressing   Problem: RH SKIN INTEGRITY Goal: RH STG SKIN FREE OF INFECTION/BREAKDOWN Description: No new skin breakdown this shift Outcome: Progressing Goal: RH STG MAINTAIN SKIN INTEGRITY WITH ASSISTANCE Description: STG Maintain Skin Integrity With mod I Assistance. Outcome: Progressing Goal: RH STG ABLE TO PERFORM INCISION/WOUND CARE W/ASSISTANCE Description: STG Able To Perform Incision/Wound Care With min Assistance. Outcome: Progressing   Problem: RH SAFETY Goal: RH STG ADHERE TO SAFETY PRECAUTIONS W/ASSISTANCE/DEVICE Description: STG Adhere to Safety Precautions With min Assistance/Device. Outcome: Progressing   Problem: RH PAIN MANAGEMENT Goal: RH STG PAIN MANAGED AT OR BELOW PT'S PAIN GOAL Description: Less than 3 out of 10 Outcome: Progressing   Problem: RH KNOWLEDGE DEFICIT Goal: RH STG INCREASE KNOWLEDGE OF HYPERTENSION Description: Pt will be able to demonstrate understanding of medication regimen, dietary and lifestyle modification to better control blood pressure with mod I assist using handouts and booklets provided.   Outcome: Progressing Goal: RH STG INCREASE KNOWLEGDE OF HYPERLIPIDEMIA Description: Patient and family will be able to manage HLD with medications and dietary modifications using handouts and educational materials with cues/reminders  Outcome: Progressing Goal: RH STG  INCREASE KNOWLEDGE OF STROKE PROPHYLAXIS Description: Pt will be able to demonstrate understanding of medication regimen, dietary and lifestyle modification to prevent stroke with min assist using handouts and booklets provided.   Outcome: Progressing

## 2020-08-10 NOTE — Progress Notes (Signed)
Occupational Therapy Session Note  Patient Details  Name: Kenneth Harper MRN: [id number] Date of Birth: 05/15/1941  Today's Date: 08/10/2020 OT Individual Time: [id number] and 1410-1521 OT Individual Time Calculation (min): 56 min and 71 min  Short Term Goals: Week 1:  OT Short Term Goal 1 (Week 1): STGs= LTGs due to ELOS      Skilled Therapeutic Interventions/Progress Updates:    Pt greeted in the recliner with no c/o pain, requesting to shower. He gathered his own clothing items before transferring to the shower chair, using SPC with CGA-supervision assistance. Setup for bathing at shower level and supervision-Mod I for dressing sit<stand from standard toilet. Pt required min cuing to locate needed items during shaving, oral care, and hair brushing when standing at the sink after. Continued working on standing endurance and dynamic balance during IADL task of bedmaking when applying clean linen to his bed, pt ambulating around his bed using SPC with CGA-supervision. He remained in the recliner at close of session, all needs within reach and safety belt fastened.   2nd Session 1:1 tx (71 min) Pt greeted in the recliner with no c/o pain. ADL needs met and agreeable to tx. To work on dynamic standing balance, pt ambulated using SPC to several community areas of the hospital. Pt transferred in<out of booth in the food court, able to scoot to make room for another person and also completed sit<stand from multiple low sofa seats without armrests. Supervision for ambulation overall with min cues for forward gaze. Worked on higher level cognition by pathfinding his way back to the unit, pt ultimately needing max cues and assistance to use visual aides. Once he returned to the room pt completed an ambulatory transfer to the bathroom with distant supervision, distant supervision for toileting tasks with pt having BM void. He transferred to the recliner and was left with all needs within reach and safety belt  fastened.   Therapy Documentation Precautions:  Precautions Precautions: Fall Required Braces or Orthoses: Other Brace Other Brace: ortho recommending right knee brace; not arrived yet. Restrictions Weight Bearing Restrictions: No Vital Signs: Therapy Vitals Temp: 97.8 F (36.6 C) Temp Source: Oral Pulse Rate: 88 Patient Position (if appropriate): Sitting Oxygen Therapy SpO2: 98 % O2 Device: Room Air ADL: ADL Grooming: Setup Where Assessed-Grooming: Sitting at sink Upper Body Bathing: Setup Where Assessed-Upper Body Bathing: Sitting at sink Lower Body Bathing: Contact guard Where Assessed-Lower Body Bathing: Standing at sink Upper Body Dressing: Setup Where Assessed-Upper Body Dressing: Sitting at sink Lower Body Dressing: Contact guard Where Assessed-Lower Body Dressing: Sitting at sink,Standing at sink Toileting: Supervision/safety Where Assessed-Toileting: Glass blower/designer: Therapist, music Method: Counselling psychologist: Grab bars      Therapy/Group: Individual Therapy  [name] [name] 08/10/2020, 12:51 PM

## 2020-08-11 NOTE — Plan of Care (Signed)
  Problem: Consults Goal: RH STROKE PATIENT EDUCATION Description: See Patient Education module for education specifics  Outcome: Progressing   Problem: RH BOWEL ELIMINATION Goal: RH STG MANAGE BOWEL WITH ASSISTANCE Description: STG Manage Bowel with mod I Assistance. Outcome: Progressing Goal: RH STG MANAGE BOWEL W/MEDICATION W/ASSISTANCE Description: STG Manage Bowel with Medication with mod I Assistance. Outcome: Progressing   Problem: RH BLADDER ELIMINATION Goal: RH STG MANAGE BLADDER WITH ASSISTANCE Description: STG Manage Bladder With mod I Assistance Outcome: Progressing   Problem: RH SKIN INTEGRITY Goal: RH STG SKIN FREE OF INFECTION/BREAKDOWN Description: No new skin breakdown this shift Outcome: Progressing Goal: RH STG MAINTAIN SKIN INTEGRITY WITH ASSISTANCE Description: STG Maintain Skin Integrity With mod I Assistance. Outcome: Progressing Goal: RH STG ABLE TO PERFORM INCISION/WOUND CARE W/ASSISTANCE Description: STG Able To Perform Incision/Wound Care With min Assistance. Outcome: Progressing   Problem: RH SAFETY Goal: RH STG ADHERE TO SAFETY PRECAUTIONS W/ASSISTANCE/DEVICE Description: STG Adhere to Safety Precautions With min Assistance/Device. Outcome: Progressing   Problem: RH PAIN MANAGEMENT Goal: RH STG PAIN MANAGED AT OR BELOW PT'S PAIN GOAL Description: Less than 3 out of 10 Outcome: Progressing   Problem: RH KNOWLEDGE DEFICIT Goal: RH STG INCREASE KNOWLEDGE OF HYPERTENSION Description: Pt will be able to demonstrate understanding of medication regimen, dietary and lifestyle modification to better control blood pressure with mod I assist using handouts and booklets provided.   Outcome: Progressing Goal: RH STG INCREASE KNOWLEGDE OF HYPERLIPIDEMIA Description: Patient and family will be able to manage HLD with medications and dietary modifications using handouts and educational materials with cues/reminders  Outcome: Progressing Goal: RH STG  INCREASE KNOWLEDGE OF STROKE PROPHYLAXIS Description: Pt will be able to demonstrate understanding of medication regimen, dietary and lifestyle modification to prevent stroke with min assist using handouts and booklets provided.   Outcome: Progressing   

## 2020-08-12 LAB — BASIC METABOLIC PANEL
Anion gap: 8 (ref 5–15)
BUN: 16 mg/dL (ref 8–23)
CO2: 24 mmol/L (ref 22–32)
Calcium: 8.9 mg/dL (ref 8.9–10.3)
Chloride: 107 mmol/L (ref 98–111)
Creatinine, Ser: 1.22 mg/dL (ref 0.61–1.24)
GFR, Estimated: >60 mL/min (ref >60)
Glucose, Bld: 125 mg/dL — ABNORMAL HIGH (ref 70–99)
Potassium: 3.6 mmol/L (ref 3.5–5.1)
Sodium: 139 mmol/L (ref 135–145)

## 2020-08-12 LAB — CBC
HCT: 40.2 % (ref 39.0–52.0)
Hemoglobin: 13.8 g/dL (ref 13.0–17.0)
MCH: 30.5 pg (ref 26.0–34.0)
MCHC: 34.3 g/dL (ref 30.0–36.0)
MCV: 88.7 fL (ref 80.0–100.0)
Platelets: 302 10*3/uL (ref 150–400)
RBC: 4.53 MIL/uL (ref 4.22–5.81)
RDW: 13.7 % (ref 11.5–15.5)
WBC: 5.9 10*3/uL (ref 4.0–10.5)
nRBC: 0 % (ref 0.0–0.2)

## 2020-08-12 LAB — HEPATIC FUNCTION PANEL
ALT: 81 U/L — ABNORMAL HIGH (ref 0–44)
AST: 22 U/L (ref 15–41)
Albumin: 3.1 g/dL — ABNORMAL LOW (ref 3.5–5.0)
Alkaline Phosphatase: 72 U/L (ref 38–126)
Bilirubin, Direct: <0.1 mg/dL (ref 0.0–0.2)
Total Bilirubin: 0.7 mg/dL (ref 0.3–1.2)
Total Protein: 6.5 g/dL (ref 6.5–8.1)

## 2020-08-12 MED ORDER — ACETAMINOPHEN 325 MG PO TABS: 325.0000 mg | ORAL_TABLET | ORAL | Status: AC | PRN

## 2020-08-12 MED ORDER — DICLOFENAC SODIUM 1 % EX GEL: 2.0000 g | Freq: Four times a day (QID) | CUTANEOUS | 0 refills | Status: DC

## 2020-08-12 MED ORDER — SENNOSIDES-DOCUSATE SODIUM 8.6-50 MG PO TABS: 1.0000 | ORAL_TABLET | Freq: Two times a day (BID) | ORAL | 0 refills | Status: DC

## 2020-08-12 MED ORDER — AMLODIPINE BESYLATE 5 MG PO TABS: 5.0000 mg | ORAL_TABLET | Freq: Every day | ORAL | 0 refills | Status: AC

## 2020-08-12 NOTE — Discharge Summary (Addendum)
Physician Discharge Summary  Patient ID: Kenneth Harper MRN: [id number] DOB/AGE: 79-23-1942 79 y.o.  Admit date: 08/06/2020 Discharge date: 08/13/2020  Discharge Diagnoses:  Principal Problem:   Thalamic hemorrhage with stroke [facility]) Active Problems:   Primary osteoarthritis of right knee   Prediabetes   Contusion of right knee   Transaminitis   Steroid-induced hyperglycemia   Essential hypertension   Discharged Condition: stable   Significant Diagnostic Studies:  Labs:  Basic Metabolic Panel: Recent Labs  Lab 08/07/20 0511 08/09/20 0758 08/12/20 0601  NA 138 139 139  K 3.4* 4.0 3.6  CL 105 102 107  CO2 $Re'23 26 24  'Siu$ GLUCOSE 160* 128* 125*  BUN 24* 19 16  CREATININE 1.21 1.43* 1.22  CALCIUM 8.9 9.3 8.9    CBC: Recent Labs  Lab 08/07/20 0511 08/12/20 0601  WBC 5.1 5.9  NEUTROABS 2.8  --   HGB 12.8* 13.8  HCT 39.8 40.2  MCV 89.6 88.7  PLT 231 302    Liver function tests: Hepatic Function Latest Ref Rng & Units 08/12/2020 08/09/2020 08/08/2020  Total Protein 6.5 - 8.1 g/dL 6.5 6.6 5.8(L)  Albumin 3.5 - 5.0 g/dL 3.1(L) 2.8(L) 2.5(L)  AST 15 - 41 U/L 22 57(H) 128(H)  ALT 0 - 44 U/L 81(H) 199(H) 266(H)  Alk Phosphatase 38 - 126 U/L 72 74 65  Total Bilirubin 0.3 - 1.2 mg/dL 0.7 1.0 0.6  Bilirubin, Direct 0.0 - 0.2 mg/dL <0.1 - <0.1    CBG: No results for input(s): GLUCAP in the last 168 hours.  Brief HPI:   Kenneth Harper is a 80 y.o. male with history of OSA, memory loss was admitted on 07/31/2020 with reports of numbness as well as fall with headache the day prior to admission.  CT head done revealing 6 cc acute hematoma in left thalamus with small intraventricular extension.  Follow-up MRI showed CBD with mild edema.  He was started on Cleviprex for BP control and has been neurologically stable.  Dr. Leonie Man felt that bleed was hypertensive in nature and recommended systolic blood pressure less than 160.  Ortho was consulted due to reports of right knee pain and  x-rays done revealing severe tricompartmental OA and large suprapatellar joint effusion.  Meloxicam and prednisone was added to help with pain management as well as recommendations to follow-up with Dr. Percell Miller after discharge.  Patient with limitations due to knee pain, right inattention as well as staggering gait affecting mobility and ADLs.  CIR was recommended due to functional decline.      Hospital Course: BRAIDAN RICCIARDI was admitted to rehab 08/06/2020 for inpatient therapies to consist of PT  and OT at least three hours five days a week. Past admission physiatrist, therapy team and rehab RN have worked together to provide customized collaborative inpatient rehab.  Meloxicam was discontinued due to evidence of AKI and reports of minimal knee pain.   Voltaren gel used to bilateral knees for pain management.  Follow-up labs showed abnormal LFTs therefore Lipitor was held during his stay.  Serial check of labs shows LFTs to be improving therefore Lipitor was resumed prior to discharge and recommend follow-up check of LFTs in a few weeks to monitor for stability.  Check of electrolytes showed mild hypokalemia which has resolved with brief supplementation.  AKI has resolved.  His blood pressures were monitored on TID basis and low-dose amlodipine added with good control.  Prednisone was discontinued on 01/28 and follow-up BMET showed that steroid-induced hyperglycemia is resolving.  Right knee pain has resolved and he has made steady gains during his stay.  He currently requires supervision with ADLs and mobility.  He is to continue to receive further follow-up home health PT and OT   Rehab course: During patient's stay in rehab team conference was held to monitor patient's progress, set goals and discuss barriers to discharge. At admission, patient required supervision to contact-guard assist with basic self-care needs and min assist with mobility. Speech therapy evaluation showed mild impairments in  short-term memory and processing.He  has had improvement in activity tolerance, balance, postural control as well as ability to compensate for deficits. He is able to complete ADL tasks with supervision. He is modified independent for transfers and requires supervision with cane to ambulate 500'.  Family education was completed regarding all aspects of safety and care.  Disposition: Home  Diet: Heart healthy  Special Instructions: 1.  No driving or strenuous activity till cleared by MD. 2.  Repeat LFTs in next 2 weeks.   Discharge Instructions     Ambulatory referral to Neurology   Complete by: As directed    An appointment is requested in approximately:3-4 weeks/ICH   Ambulatory referral to Physical Medicine Rehab   Complete by: As directed    1-2 weeks follow up appt      Allergies as of 08/13/2020   No Known Allergies      Medication List     STOP taking these medications    meloxicam 7.5 MG tablet Commonly known as: MOBIC   pantoprazole 40 MG tablet Commonly known as: PROTONIX   predniSONE 10 MG tablet Commonly known as: DELTASONE       TAKE these medications    acetaminophen 325 MG tablet Commonly known as: TYLENOL Take 1-2 tablets (325-650 mg total) by mouth every 4 (four) hours as needed for mild pain.   amLODipine 5 MG tablet Commonly known as: NORVASC Take 1 tablet (5 mg total) by mouth daily.   atorvastatin 40 MG tablet Commonly known as: LIPITOR Take 40 mg by mouth daily.   diclofenac Sodium 1 % Gel Commonly known as: VOLTAREN Apply 2 g topically 4 (four) times daily. Notes to patient: To knees   senna-docusate 8.6-50 MG tablet Commonly known as: Senokot-S Take 1 tablet by mouth 2 (two) times daily. Notes to patient: For constipation--purchase over the counter.         Follow-up Information     Jamse Arn, MD Follow up.   Specialty: Physical Medicine and Rehabilitation Why: Office will call you with follow up  appointment Contact information: [address] [place] [address] [phone]         Wenda Low, MD. Call in 1 day(s).   Specialty: Internal Medicine Why: for post hospital follow up Contact information: [address] [place] [address] [phone]         [facility]. Call on 08/19/2020.   Why: If you have not heard from them--for stroke follow up Contact information: [address]     [place] [address] [phone]                Signed: Bary Leriche 08/13/2020, 6:10 PM Patient was seen, face-face, and physical exam performed by me on day of discharge, greater than 30 minutes of total time spent.. Please see progress note from day of discharge as well.  Delice Lesch, MD, ABPMR

## 2020-08-12 NOTE — Progress Notes (Signed)
Speech Language Pathology Discharge Summary  Patient Details  Name: Kenneth Harper MRN: [id number] Date of Birth: May 27, 1941  Today's Date: 08/12/2020 SLP Individual Time: 0801-0845 SLP Individual Time Calculation (min): 44 min   Skilled Therapeutic Interventions: Skilled ST services focused on education and cognitive skills. Pt requested to use the bathroom and demonstrated x1 lost of footing on right side, but was able to correct himself utilizing walking cane. Pt's son called and SLP provided education pertaining to cognitive needs, Pt's son agreed to assist with these skill along with primary caretaker pt's wife. Pt demonstrated verbal problem solving skills with current medication time per day , but reduced mental flexibility when asked to problem solve medication consumed x3-4 a day. Pt completed subsections of CLQT noting deficits listed below in d/c summary. Education was provided and all questions answered to satisfaction. Pt was left in room with call bell within reach and chair alarm set.    Patient has met 1 of 5 long term goals.  Patient to discharge at [facility] level.  Reasons goals not met: show progress and short LOS due to advanced mobility level   Clinical Impression/Discharge Summary:   Pt met 1 out 5 goals, discharging at min A due to slow cognitive progress and short LOS from advanced mobility skills. Pt completed money and medication management. Pt scored moderate deficits in memory and mild deficits in language, executive function and attention skills on formal cognitive linguistic assessment, CLQT. Pt continues to have a reduced insight into deficits and requires outpatient ST services to focus on higher level problem solving, impact of deficits on function, attention and memory. Education was completed with pt and pt's son via phone. SLP recommending 24 hour supervision and especially for complex problem solving such as money/medication/time management. Pt benefited from  skilled ST services in order to maximize functional independence and reduce burden of care, requiring 24 hour supervision at discharge with continued outpatient skilled ST services.  Care Partner:  Caregiver Able to Provide Assistance: Yes  Type of Caregiver Assistance: Cognitive;Physical  Recommendation:  24 hour supervision/assistance;Outpatient SLP  Rationale for SLP Follow Up: Maximize cognitive function and independence;Maximize functional communication;Reduce caregiver burden   Equipment: N/A   Reasons for discharge: Discharged from hospital   Patient/Family Agrees with Progress Made and Goals Achieved: Yes    Kenneth Harper  [facility] 08/12/2020, 10:13 AM

## 2020-08-12 NOTE — Progress Notes (Signed)
Physical Therapy Discharge Summary  Patient Details  Name: Kenneth Harper MRN: [id number] Date of Birth: February 02, 1941  Today's Date: 08/12/2020 PT Individual Time: 0900-1010 PT Individual Time Calculation (min): 70 min    Patient has met 6 of 10 long term goals due to improved activity tolerance, improved balance, increased strength, improved attention, improved awareness and improved coordination.  Patient to discharge at an ambulatory level Modified Independent.   Patient's care partner is independent to provide the necessary physical & cognitive assistance at discharge.  Reasons goals not met: Stair goal set to mod I. Without handrails, pt required CGA for stair negotiation. Pt does not have handrails on home entrance and provided education on installing rails for future safety and efficiency with entrance into home. With handrails, he could complete with supervision. Ambulation goal set to mod I. For safety, pt requires supervision with gait.  Recommendation:  Patient will benefit from ongoing skilled PT services in home health setting to continue to advance safe functional mobility, address ongoing impairments in higher level dynamic balance and general strength training in order to minimize fall risk.  Equipment: SPC  Reasons for discharge: treatment goals met and discharge from hospital  Patient/family agrees with progress made and goals achieved: Yes  PT Discharge Precautions/Restrictions Precautions Precautions: Fall Restrictions Weight Bearing Restrictions: No Pain Pain Assessment Pain Scale: 0-10 Pain Score: 0-No pain Vision/Perception  Perception Perception: Within Functional Limits Praxis Praxis: Intact  Cognition Overall Cognitive Status: Impaired/Different from baseline Arousal/Alertness: Awake/alert Orientation Level: Oriented X4 Attention: Sustained;Focused;Selective Focused Attention: Appears intact Sustained Attention: Appears intact Selective Attention:  Appears intact Alternating Attention: Impaired Alternating Attention Impairment: Functional complex;Verbal complex Memory: Impaired Memory Impairment: Storage deficit;Retrieval deficit;Decreased recall of new information Problem Solving: Impaired Problem Solving Impairment: Functional complex Safety/Judgment: Appears intact Sensation Sensation Light Touch: Appears Intact Hot/Cold: Appears Intact Proprioception: Appears Intact Stereognosis: Not tested Coordination Gross Motor Movements are Fluid and Coordinated: Yes Fine Motor Movements are Fluid and Coordinated: Yes Motor  Motor Motor: Within Functional Limits Motor - Discharge Observations: Mild R side weakness, improved since date of evaluation  Mobility Bed Mobility Bed Mobility: Rolling Left;Rolling Right;Supine to Sit;Sit to Supine Rolling Right: Independent Rolling Left: Independent Supine to Sit: Independent Sit to Supine: Independent Transfers Transfers: Stand to [employer];Sit to Stand Sit to Stand: Independent with assistive device Stand to Sit: Independent with assistive device Stand Pivot Transfers: Independent with assistive device Transfer (Assistive device): Straight cane Locomotion  Gait Ambulation: Yes Gait Assistance: Supervision/Verbal cueing Gait Distance (Feet): 500 Feet Assistive device: Straight cane Gait Assistance Details: Verbal cues for technique;Verbal cues for precautions/safety Gait Gait: Yes Gait Pattern: Impaired Gait Pattern: Step-through pattern;Decreased step length - right;Decreased weight shift to left;Trunk flexed Stairs / Additional Locomotion Stairs: Yes  Trunk/Postural Assessment  Cervical Assessment Cervical Assessment: Within Functional Limits Thoracic Assessment Thoracic Assessment: Within Functional Limits Lumbar Assessment Lumbar Assessment: Within Functional Limits Postural Control Postural Control: Within Functional Limits  Balance Balance Balance  Assessed: Yes Standardized Balance Assessment Standardized Balance Assessment: Berg Balance Test Berg Balance Test Sit to Stand: Able to stand without using hands and stabilize independently Standing Unsupported: Able to stand safely 2 minutes Sitting with Back Unsupported but Feet Supported on Floor or Stool: Able to sit safely and securely 2 minutes Stand to Sit: Sits safely with minimal use of hands Transfers: Able to transfer safely, minor use of hands Standing Unsupported with Eyes Closed: Able to stand 10 seconds safely Standing Ubsupported with Feet Together: Able to  place feet together independently and stand 1 minute safely From Standing, Reach Forward with Outstretched Arm: Can reach forward >12 cm safely (5") From Standing Position, Pick up Object from Floor: Able to pick up shoe safely and easily From Standing Position, Turn to Look Behind Over each Shoulder: Looks behind one side only/other side shows less weight shift Turn 360 Degrees: Able to turn 360 degrees safely but slowly Standing Unsupported, Alternately Place Feet on Step/Stool: Able to stand independently and safely and complete 8 steps in 20 seconds Standing Unsupported, One Foot in Front: Able to plae foot ahead of the other independently and hold 30 seconds Standing on One Leg: Able to lift leg independently and hold equal to or more than 3 seconds Total Score: 49 Extremity Assessment  RUE Assessment RUE Assessment: Within Functional Limits LUE Assessment LUE Assessment: Within Functional Limits RLE Assessment RLE Assessment: Within Functional Limits General Strength Comments: Grossly 4+/5 LLE Assessment LLE Assessment: Within Functional Limits General Strength Comments: Grossly 5/5  Skilled Intervention: Pt received sitting upright in recliner, agreeable to therapy. No reports of pain. Brief discussion regarding DC planning, home safety training, DME rec's, and role of f/u therapies. Sit<>stand transfer mod  I with SPC from recliner height. He ambulated from his room to day room gym, >27ft, with supervision and SPC. He performed BERG (see above for details), scoring 49/56 which is a 5 point improvement from evaluation. Also performed TUG x3 trials with SPC, averaging a score of 13.8 seconds which is a 6.2second improvement since date of evaluation. Score >13.5 seconds indicates increased falls risk and falls risk to be addressed at next level of care. Performed car transfer mod I with SPC with car height set to simulate his stationwagon and his son's midsize SUV. Gait training up/down x10 ft ramp with supervision and SPC, increased difficult with descent > ascent. Performed bed mobility in ADL apartment indep with no bed features and performed furniture transfers mod I with SPC. Stair training up/down x12 consecutive steps with CGA and 1 HR with SPC. Step-to pattern and cues needed for cane sequencing. No LOB noted but unsteadiness present. Educated him on importance of having family assist with stairs at home, pt verbalized understanding and intent to comply. Performed furniture transfer and educated patient on events after a fall and fall recovery. He performed floor transfer with supervision. Ambulated back to his room mod I and he ended session seated in recliner with safety belt alarm on and needs in reach. Pt voiced readiness and excitement regarding tomorrow's DC. Reiterated importance of safety, asking for help, and continuation of therapy services to address falls risk.  Patient demonstrates increased fall risk as noted by score of   49/56 on Berg Balance Scale.  (<36= high risk for falls, close to 100%; 37-45 significant >80%; 46-51 moderate >50%; 52-55 lower >25%)  Kamiryn Bezanson P Eiko Mcgowen  PT, DPT 08/12/2020, 10:14 AM

## 2020-08-12 NOTE — Progress Notes (Addendum)
[name]  Subjective/Complaints: Patient seen sitting up in bed this morning working with therapies.  He states he slept well overnight.  He states he had a good weekend.  He is looking forward to discharge tomorrow.  ROS: Denies CP, SOB, N/V/D  Objective: Vital Signs: Blood pressure 131/72, pulse 85, temperature 98 F (36.7 C), resp. rate 20, height 5\' 10"  (1.778 m), weight 102.5 kg, SpO2 96 %. No results found. Recent Labs    08/12/20 0601  WBC 5.9  HGB 13.8  HCT 40.2  PLT 302   Recent Labs    08/12/20 0601  NA 139  K 3.6  CL 107  CO2 24  GLUCOSE 125*  BUN 16  CREATININE 1.22  CALCIUM 8.9    Intake/Output Summary (Last 24 hours) at 08/12/2020 1031 Last data filed at 08/12/2020 0710 Gross per 24 hour  Intake 745 ml  Output 300 ml  Net 445 ml        Physical Exam: BP 131/72 (BP Location: Left Arm)   Pulse 85   Temp 98 F (36.7 C)   Resp 20   Ht 5\' 10"  (1.778 m)   Wt 102.5 kg   SpO2 96%   BMI 32.43 kg/m   Constitutional: No distress . Vital signs reviewed. HENT: Normocephalic.  Atraumatic. Eyes: EOMI. No discharge. Cardiovascular: No JVD.  RRR. Respiratory: Normal effort.  No stridor.  Bilateral clear to auscultation. GI: Non-distended.  BS +. Skin: Warm and dry.  Intact. Psych: Normal mood.  Normal behavior. Musc: Right lower extremity edema with mild tenderness at and distal to the knee, improved Neuro: Alert Motor: 5/5 throughout, slightly weaker on right, however left-handed, stable Able to follow simple motor commands.   Assessment/Plan: 1. Functional deficits which require 3+ hours per day of interdisciplinary therapy in a comprehensive inpatient rehab setting.  Physiatrist is providing close team supervision and 24 hour management of active medical problems listed below.  Physiatrist and rehab team continue to assess barriers to discharge/monitor patient progress toward functional and  medical goals   Care Tool:  Bathing    Body parts bathed by patient: Right arm,Left arm,Chest,Abdomen,Front perineal area,Buttocks,Right upper leg,Left upper leg,Right lower leg,Left lower leg,Face         Bathing assist Assist Level: Set up assist     Upper Body Dressing/Undressing Upper body dressing   What is the patient wearing?: Pull over shirt    Upper body assist Assist Level: Independent with assistive device    Lower Body Dressing/Undressing Lower body dressing      What is the patient wearing?: Pants     Lower body assist Assist for lower body dressing: Supervision/Verbal cueing     Toileting Toileting Toileting Activity did not occur (Clothing management and hygiene only):  (pt uses urinal)  Toileting assist Assist for toileting: Supervision/Verbal cueing Assistive Device Comment: Urinal   Transfers Chair/bed transfer  Transfers assist     Chair/bed transfer assist level: Supervision/Verbal cueing     Locomotion Ambulation   Ambulation assist      Assist level: Minimal Assistance - Patient > 75% Assistive device: Walker-rolling Max distance: 200   Walk 10 feet activity   Assist     Assist level: Minimal Assistance - Patient > 75% Assistive device: No Device   Walk 50 feet activity   Assist    Assist level: Minimal Assistance - Patient > 75% Assistive device: No Device    Walk 150 feet activity  Assist    Assist level: Minimal Assistance - Patient > 75% Assistive device: No Device    Walk 10 feet on uneven surface  activity   Assist     Assist level: Minimal Assistance - Patient > 75% Assistive device: Other (comment) (rail)   Wheelchair     Assist Will patient use wheelchair at discharge?: No             Wheelchair 50 feet with 2 turns activity    Assist            Wheelchair 150 feet activity     Assist           Medical Problem List and Plan: 1. Left handed with balance  impairment secondary to L thalamic hemorrhage  Continue CIR, patient family education 2.  Antithrombotics: -DVT/anticoagulation:  Mechanical: Sequential compression devices, below knee Bilateral lower extremities             -antiplatelet therapy: N/A 3. Right knee contusion/Pain Management:  D/ced meloxicam as SCr trending up.   Continue low dose prednisone with Tylenol #3 prn.    Controlled with meds on 1/31  Monitor with increased exertion 4. Mood: LCSW to follow for for evaluation and support.              -antipsychotic agents: N/A 5. Neuropsych: This patient is not fully capable of making decisions on his own behalf. 6. Skin/Wound Care: routine pressure relief measures.  7. Fluids/Electrolytes/Nutrition: Monitor I/Os. 8. HTN: Monitor BP tid   Vitals:   08/11/20 1917 08/12/20 0459  BP: (!) 141/88 131/72  Pulse: 88 85  Resp: 20 20  Temp: 97.9 F (36.6 C) 98 F (36.7 C)  SpO2: 94% 96%   Relatively controlled on 1/31 9. Hyponatremia: Resolved  10. Steroid induced hyperglyvemia on Prediabetes: Hgb A1c-5.8.   Slightly elevated on 1/31  Monitor increase mobility 11. R knee swelling/effusion- severe DJD  See #3  Improved 12. Urinary frequency  Continue to monitor 13. Hypokalemia improved  Potassium 3.6 on 1/31  Supplemented x1 day on 1/26  Continue to monitor 14. 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 Transaminitis  LFTs elevated, but improving on 1/28, labs pending  Lipitor d/ced, will consider challenging again     LOS: 6 days A FACE TO FACE EVALUATION WAS PERFORMED  Kenneth Harper Lorie Phenix 08/12/2020, 10:31 AM

## 2020-08-12 NOTE — Progress Notes (Addendum)
Occupational Therapy Discharge Summary  Patient Details  Name: Kenneth Harper MRN: [id number] Date of Birth: [birth year]  Today's Date: 08/12/2020 OT Individual Time: 1100-1209 OT Individual Time Calculation (min): 69 min    Patient has met 5 of 8 long term goals due to improved activity tolerance, improved balance, ability to compensate for deficits and improved awareness.  Patient to discharge at overall Supervision level.  Patient's care partner is independent to provide the necessary cognitive assistance at discharge.    Reasons goals not met: Pt requires occasional verbal cues for safety techniques during functional mobility.   Recommendation:  Patient will benefit from ongoing skilled OT services in home health setting to continue to advance functional skills in the area of BADL.   Equipment: single point cane   Reasons for discharge: treatment goals met and discharge from hospital   Skilled Intervention: Pt sitting up in recliner, no c/o pain, agreeable to OT session. Pt ambulated using SPC with distant supervision to retrieve ADL items in preperation for showering.  Pt ambulated to toilet and urinated in standing with distant supervision.  Shower chair transfer completed requiring one CGA due to mild LOB but able to recover using grab bars for balance.  Pt bathed seated with mod I.  Pt completed UB dressing with mod I and LB dressing with supervision.  Pt educated on safety technique of completing grooming in seated position and pt return demo'd with mod I.  Pt then ambulated back to recliner with supervision.  Pts son arrived and pt called wife on phone for dc planning and recommendations caregiver education.  Educated both wife and son regarding pts need for supervision during functional transfers and need for grab bar installation in shower prior to bathing at shower level at home.  Also recommended pts wife continue to manage her own medications due to pts current presentation with  memory impairment and problem solving deficits.  Pts wife and son report good understanding of all education and agreeable with carryover.  Call bell in reach, seat belt alarm on.  Patient/family agrees with progress made and goals achieved: Yes  OT Discharge Precautions/Restrictions  Precautions Precautions: Fall Restrictions Weight Bearing Restrictions: No Pain Pain Assessment Pain Scale: 0-10 Pain Score: 0-No pain ADL ADL Grooming: Setup Where Assessed-Grooming: Sitting at sink Upper Body Bathing: Modified independent Where Assessed-Upper Body Bathing: Shower (seated at shower chair) Lower Body Bathing: Setup Where Assessed-Lower Body Bathing: Shower Upper Body Dressing: Independent Where Assessed-Upper Body Dressing: Sitting at sink Lower Body Dressing: Supervision/safety Where Assessed-Lower Body Dressing: Sitting at sink,Standing at sink Toileting: Modified independent Where Assessed-Toileting: Toilet (using grab bars) Toilet Transfer: Distant supervision Toilet Transfer Method: Counselling psychologist: Grab bars Tub/Shower Transfer: Metallurgist Method: Patent attorney with back Vision Baseline Vision/History: Wears glasses Wears Glasses: Reading only Patient Visual Report: No change from baseline Vision Assessment?: No apparent visual deficits Perception  Perception: Within Functional Limits Praxis Praxis: Intact Cognition Overall Cognitive Status: Impaired/Different from baseline Arousal/Alertness: Awake/alert Orientation Level: Oriented X4 Attention: Sustained;Focused;Selective;Alternating Focused Attention: Appears intact Sustained Attention: Appears intact Selective Attention: Appears intact Alternating Attention: Impaired Alternating Attention Impairment: Functional complex;Verbal complex Divided Attention: Appears intact Memory: Impaired Memory Impairment: Storage deficit;Retrieval  deficit;Decreased recall of new information;Decreased short term memory Decreased Short Term Memory: Functional basic;Verbal basic Awareness: Impaired Awareness Impairment: Anticipatory impairment Problem Solving: Impaired Problem Solving Impairment: Functional complex Safety/Judgment: Appears intact Sensation Sensation Light Touch: Appears Intact Hot/Cold: Appears Intact Proprioception: Appears Intact Stereognosis:  Not tested Coordination Gross Motor Movements are Fluid and Coordinated: Yes Fine Motor Movements are Fluid and Coordinated: Yes Motor  Motor Motor: Within Functional Limits Motor - Discharge Observations: Mild R side weakness, improved since date of evaluation Mobility  Bed Mobility Bed Mobility: Rolling Left;Rolling Right;Supine to Sit;Sit to Supine Rolling Right: Independent Rolling Left: Independent Supine to Sit: Independent Sit to Supine: Independent Transfers Sit to Stand: Independent with assistive device Stand to Sit: Supervision/Verbal cueing  Trunk/Postural Assessment  Cervical Assessment Cervical Assessment: Within Functional Limits Thoracic Assessment Thoracic Assessment: Within Functional Limits Lumbar Assessment Lumbar Assessment: Within Functional Limits Postural Control Postural Control: Within Functional Limits  Balance Balance Balance Assessed: Yes Standardized Balance Assessment Standardized Balance Assessment: Berg Balance Test Berg Balance Test Sit to Stand: Able to stand without using hands and stabilize independently Standing Unsupported: Able to stand safely 2 minutes Sitting with Back Unsupported but Feet Supported on Floor or Stool: Able to sit safely and securely 2 minutes Stand to Sit: Sits safely with minimal use of hands Transfers: Able to transfer safely, minor use of hands Standing Unsupported with Eyes Closed: Able to stand 10 seconds safely Standing Ubsupported with Feet Together: Able to place feet together  independently and stand 1 minute safely From Standing, Reach Forward with Outstretched Arm: Can reach forward >12 cm safely (5") From Standing Position, Pick up Object from Floor: Able to pick up shoe safely and easily From Standing Position, Turn to Look Behind Over each Shoulder: Looks behind one side only/other side shows less weight shift Turn 360 Degrees: Able to turn 360 degrees safely but slowly Standing Unsupported, Alternately Place Feet on Step/Stool: Able to stand independently and safely and complete 8 steps in 20 seconds Standing Unsupported, One Foot in Front: Able to plae foot ahead of the other independently and hold 30 seconds Standing on One Leg: Able to lift leg independently and hold equal to or more than 3 seconds Total Score: 49 Static Sitting Balance Static Sitting - Balance Support: No upper extremity supported Static Sitting - Level of Assistance: 7: Independent Dynamic Sitting Balance Dynamic Sitting - Balance Support: No upper extremity supported Dynamic Sitting - Level of Assistance: 6: Modified independent (Device/Increase time) Static Standing Balance Static Standing - Balance Support: No upper extremity supported Static Standing - Level of Assistance: 6: Modified independent (Device/Increase time) Dynamic Standing Balance Dynamic Standing - Balance Support: No upper extremity supported Dynamic Standing - Level of Assistance: 5: Stand by assistance Extremity/Trunk Assessment RUE Assessment RUE Assessment: Within Functional Limits LUE Assessment LUE Assessment: Within Functional Limits   Anay Rathe L Acheron Sugg 08/12/2020, 12:30 PM

## 2020-08-13 DIAGNOSIS — I10 Essential (primary) hypertension: Secondary | ICD-10-CM

## 2020-08-13 NOTE — Progress Notes (Addendum)
[facility] PHYSICAL MEDICINE & REHABILITATION PROGRESS NOTE  Subjective/Complaints: Patient seen sitting up in bed this morning.  He states he slept well overnight.  He is questions regarding follow-up appointment and return to work.  He is ready for discharge.  ROS: Denies CP, SOB, N/V/D  Objective: Vital Signs: Blood pressure 136/85, pulse 79, temperature 98.3 F (36.8 C), resp. rate 17, height 5\' 10"  (1.778 m), weight 102.5 kg, SpO2 95 %. No results found. Recent Labs    08/12/20 0601  WBC 5.9  HGB 13.8  HCT 40.2  PLT 302   Recent Labs    08/12/20 0601  NA 139  K 3.6  CL 107  CO2 24  GLUCOSE 125*  BUN 16  CREATININE 1.22  CALCIUM 8.9    Intake/Output Summary (Last 24 hours) at 08/13/2020 0837 Last data filed at 08/13/2020 0755 Gross per 24 hour  Intake 840 ml  Output --  Net 840 ml        Physical Exam: BP 136/85   Pulse 79   Temp 98.3 F (36.8 C)   Resp 17   Ht 5\' 10"  (1.778 m)   Wt 102.5 kg   SpO2 95%   BMI 32.43 kg/m   Constitutional: No distress . Vital signs reviewed. HENT: Normocephalic.  Atraumatic. Eyes: EOMI. No discharge. Cardiovascular: No JVD.  RRR. Respiratory: Normal effort.  No stridor.  Bilateral clear to auscultation. GI: Non-distended.  BS +. Skin: Warm and dry.  Intact. Psych: Normal mood.  Normal behavior. Musc: Right lower extremity edema with mild tenderness at and distal to the knee, improved Neuro: Alert Motor: 5/5 throughout, slightly weaker on right, however left-handed, unchanged Able to follow simple motor commands.   Assessment/Plan: 1. Functional deficits which require 3+ hours per day of interdisciplinary therapy in a comprehensive inpatient rehab setting.  Physiatrist is providing close team supervision and 24 hour management of active medical problems listed below.  Physiatrist and rehab team continue to assess barriers to discharge/monitor patient progress toward functional and medical goals   Care  Tool:  Bathing    Body parts bathed by patient: Right arm,Left arm,Chest,Abdomen,Front perineal area,Buttocks,Right upper leg,Left upper leg,Right lower leg,Left lower leg,Face         Bathing assist Assist Level: Supervision/Verbal cueing     Upper Body Dressing/Undressing Upper body dressing   What is the patient wearing?: Pull over shirt    Upper body assist Assist Level: Independent    Lower Body Dressing/Undressing Lower body dressing      What is the patient wearing?: Pants,Underwear/pull up     Lower body assist Assist for lower body dressing: Supervision/Verbal cueing     Toileting Toileting Toileting Activity did not occur (Clothing management and hygiene only):  (pt uses urinal)  Toileting assist Assist for toileting: Independent with assistive device Assistive Device Comment: Urinal   Transfers Chair/bed transfer  Transfers assist     Chair/bed transfer assist level: Independent with assistive device Chair/bed transfer assistive device: Research officer, political party   Ambulation assist      Assist level: Supervision/Verbal cueing Assistive device: Cane-straight Max distance: 423ft   Walk 10 feet activity   Assist     Assist level: Supervision/Verbal cueing Assistive device: Cane-straight   Walk 50 feet activity   Assist    Assist level: Supervision/Verbal cueing Assistive device: Cane-straight    Walk 150 feet activity   Assist    Assist level: Supervision/Verbal cueing Assistive device: Cane-straight    Walk  10 feet on uneven surface  activity   Assist     Assist level: Supervision/Verbal cueing Assistive device: Government social research officer Will patient use wheelchair at discharge?: No             Wheelchair 50 feet with 2 turns activity    Assist            Wheelchair 150 feet activity     Assist           Medical Problem List and Plan: 1. Left handed with balance  impairment secondary to L thalamic hemorrhage  DC today  Will see patient for hospital follow-up in 1 month post-discharge 2.  Antithrombotics: -DVT/anticoagulation:  Mechanical: Sequential compression devices, below knee Bilateral lower extremities             -antiplatelet therapy: N/A 3. Right knee contusion/Pain Management:  D/ced meloxicam as SCr trending up.   Continue low dose prednisone with Tylenol #3 prn.    Controlled with meds on 2/1  Monitor with increased exertion 4. Mood: LCSW to follow for for evaluation and support.              -antipsychotic agents: N/A 5. Neuropsych: This patient is not fully capable of making decisions on his own behalf. 6. Skin/Wound Care: routine pressure relief measures.  7. Fluids/Electrolytes/Nutrition: Monitor I/Os. 8. HTN: Monitor BP tid   Vitals:   08/12/20 1930 08/13/20 0517  BP: 127/77 136/85  Pulse: 70 79  Resp: 17 17  Temp: 98.3 F (36.8 C) 98.3 F (36.8 C)  SpO2: 97% 95%   Relatively controlled on 2/1 9. Hyponatremia: Resolved  10. Steroid induced hyperglyvemia on Prediabetes: Hgb A1c-5.8.   Slightly elevated on 1/31  Monitor increase mobility 11. R knee swelling/effusion- severe DJD  See #3  Improved 12. Urinary frequency  Continue to monitor 13. Hypokalemia improved  Potassium 3.6 on 1/31  Supplemented x1 day on 1/26  Continue to monitor 14. 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 Transaminitis  LFTs elevated, but overall significant improvement  Lipitor d/ced, will resume and recommend follow-up LFTs as outpatient to monitor.  > 30 minutes spent in total in discharge planning between myself and PA regarding aforementioned, as well discussion regarding DME equipment, follow-up appointments, return to work, follow-up therapies, discharge medications, discharge recommendations, answering questions  LOS: 7 days A FACE TO [place] 08/13/2020, 8:37 AM

## 2020-08-13 NOTE — Discharge Instructions (Signed)
Inpatient Rehab Discharge Instructions  LLIAM HOH Discharge date and time: 08/13/20    Activities/Precautions/ Functional Status: Activity: no lifting, driving, or strenuous exercise  till cleared by MD Diet: cardiac diet Wound Care: none needed   Functional status:  ___ No restrictions     ___ Walk up steps independently _X__ 24/7 supervision/assistance   ___ Walk up steps with assistance ___ Intermittent supervision/assistance  ___ Bathe/dress independently ___ Walk with walker     ___ Bathe/dress with assistance ___ Walk Independently    ___ Shower independently _X__ Walk with supervision.     _X__ Shower with assistance ___ No alcohol     ___ Return to work/school ________  Special Instructions:   COMMUNITY REFERRALS UPON DISCHARGE:    Home Health:   PT, OT,SP                  Agency: [facility] Phone:  [phone]  Medical Equipment/Items Ordered: NO NEEDS HAS FROM PREVIOUS ADMITS                                                     STROKE/TIA DISCHARGE INSTRUCTIONS SMOKING Cigarette smoking nearly doubles your risk of having a stroke & is the single most alterable risk factor  If you smoke or have smoked in the last 12 months, you are advised to quit smoking for your health.  Most of the excess cardiovascular risk related to smoking disappears within a year of stopping.  Ask you doctor about anti-smoking medications   Quit Line: 1-800-QUIT NOW  Free Smoking Cessation Classes [phone]  CHOLESTEROL Know your levels; limit fat & cholesterol in your diet  Lipid Panel     Component Value Date/Time   CHOL 123 08/01/2020 0952   TRIG 86 08/01/2020 0952   HDL 34 (L) 08/01/2020 0952   CHOLHDL 3.6 08/01/2020 0952   VLDL 17 08/01/2020 0952   LDLCALC 72 08/01/2020 0952      Many patients benefit from treatment even if their cholesterol is at goal.  Goal: Total Cholesterol (CHOL) less than 160  Goal:  Triglycerides (TRIG) less than 150  Goal:   HDL greater than 40  Goal:  LDL (LDLCALC) less than 100   BLOOD PRESSURE American Stroke Association blood pressure target is less that 120/80 mm/Hg  Your discharge blood pressure is:  BP: 136/85  Monitor your blood pressure  Limit your salt and alcohol intake  Many individuals will require more than one medication for high blood pressure  DIABETES (A1c is a blood sugar average for last 3 months) Goal HGBA1c is under 7% (HBGA1c is blood sugar average for last 3 months)  Diabetes: No known diagnosis of diabetes    Lab Results  Component Value Date   HGBA1C 5.8 (H) 08/01/2020     Your HGBA1c can be lowered with medications, healthy diet, and exercise.  Check your blood sugar as directed by your physician  Call your physician if you experience unexplained or low blood sugars.  PHYSICAL ACTIVITY/REHABILITATION Goal is 30 minutes at least 4 days per week  Activity: No driving, Therapies: see above  Return to work: N/a  Activity decreases your risk of heart attack and stroke and makes your heart stronger.  It helps control your weight and blood pressure; helps you relax and can improve your mood.  Participate in a regular exercise program.  Talk with your doctor about the best form of exercise for you (dancing, walking, swimming, cycling).  DIET/WEIGHT Goal is to maintain a healthy weight  Your discharge diet is:  Diet Order            Diet regular Room service appropriate? Yes with Assist; Fluid consistency: Thin  Diet effective now               liquids Your height is:  Height: 5\' 10"  (177.8 cm) Your current weight is: 225 lbs Your Body Mass Index (BMI) is:  BMI (Calculated): 32.43  Following the type of diet specifically designed for you will help prevent another stroke.  Your goal weight is 174 lbs  Your goal Body Mass Index (BMI) is 19-24.  Healthy food habits can help reduce 3 risk factors for stroke:  High cholesterol, hypertension, and excess weight.  RESOURCES  Stroke/Support Group:  Call [phone]   STROKE EDUCATION PROVIDED/REVIEWED AND GIVEN TO PATIENT Stroke warning signs and symptoms How to activate emergency medical system (call 911). Medications prescribed at discharge. Need for follow-up after discharge. Personal risk factors for stroke. Pneumonia vaccine given:  Flu vaccine given:  My questions have been answered, the writing is legible, and I understand these instructions.  I will adhere to these goals & educational materials that have been provided to me after my discharge from the hospital.     My questions have been answered and I understand these instructions. I will adhere to these goals and the provided educational materials after my discharge from the hospital.  Patient/Caregiver Signature _______________________________ Date __________  Clinician Signature _______________________________________ Date __________  Please bring this form and your medication list with you to all your follow-up doctor's appointments.

## 2020-08-13 NOTE — Progress Notes (Signed)
Patient discharge to home with son and all discharge instructions. All belongings with patient. All questions answered. Patient discharged at 1250.

## 2020-08-13 NOTE — Plan of Care (Signed)
  Problem: Consults Goal: RH STROKE PATIENT EDUCATION Description: See Patient Education module for education specifics  Outcome: Completed/Met   Problem: RH BOWEL ELIMINATION Goal: RH STG MANAGE BOWEL WITH ASSISTANCE Description: STG Manage Bowel with mod I Assistance. Outcome: Completed/Met Goal: RH STG MANAGE BOWEL W/MEDICATION W/ASSISTANCE Description: STG Manage Bowel with Medication with mod I Assistance. Outcome: Completed/Met   Problem: RH BLADDER ELIMINATION Goal: RH STG MANAGE BLADDER WITH ASSISTANCE Description: STG Manage Bladder With mod I Assistance Outcome: Completed/Met   Problem: RH SKIN INTEGRITY Goal: RH STG SKIN FREE OF INFECTION/BREAKDOWN Description: No new skin breakdown this shift Outcome: Completed/Met Goal: RH STG MAINTAIN SKIN INTEGRITY WITH ASSISTANCE Description: STG Maintain Skin Integrity With mod I Assistance. Outcome: Completed/Met Goal: RH STG ABLE TO PERFORM INCISION/WOUND CARE W/ASSISTANCE Description: STG Able To Perform Incision/Wound Care With min Assistance. Outcome: Completed/Met   Problem: RH SAFETY Goal: RH STG ADHERE TO SAFETY PRECAUTIONS W/ASSISTANCE/DEVICE Description: STG Adhere to Safety Precautions With min Assistance/Device. Outcome: Completed/Met   Problem: RH PAIN MANAGEMENT Goal: RH STG PAIN MANAGED AT OR BELOW PT'S PAIN GOAL Description: Less than 3 out of 10 Outcome: Completed/Met   Problem: RH KNOWLEDGE DEFICIT Goal: RH STG INCREASE KNOWLEDGE OF HYPERTENSION Description: Pt will be able to demonstrate understanding of medication regimen, dietary and lifestyle modification to better control blood pressure with mod I assist using handouts and booklets provided.   Outcome: Completed/Met Goal: RH STG INCREASE KNOWLEGDE OF HYPERLIPIDEMIA Description: Patient and family will be able to manage HLD with medications and dietary modifications using handouts and educational materials with cues/reminders  Outcome:  Completed/Met Goal: RH STG INCREASE KNOWLEDGE OF STROKE PROPHYLAXIS Description: Pt will be able to demonstrate understanding of medication regimen, dietary and lifestyle modification to prevent stroke with min assist using handouts and booklets provided.   Outcome: Completed/Met

## 2020-08-14 DIAGNOSIS — G5600 Carpal tunnel syndrome, unspecified upper limb: Secondary | ICD-10-CM | POA: Diagnosis not present

## 2020-08-14 DIAGNOSIS — I69153 Hemiplegia and hemiparesis following nontraumatic intracerebral hemorrhage affecting right non-dominant side: Secondary | ICD-10-CM | POA: Diagnosis not present

## 2020-08-14 DIAGNOSIS — J45909 Unspecified asthma, uncomplicated: Secondary | ICD-10-CM | POA: Diagnosis not present

## 2020-08-14 DIAGNOSIS — M170 Bilateral primary osteoarthritis of knee: Secondary | ICD-10-CM | POA: Diagnosis not present

## 2020-08-14 DIAGNOSIS — K219 Gastro-esophageal reflux disease without esophagitis: Secondary | ICD-10-CM | POA: Diagnosis not present

## 2020-08-14 DIAGNOSIS — G4733 Obstructive sleep apnea (adult) (pediatric): Secondary | ICD-10-CM | POA: Diagnosis not present

## 2020-08-14 DIAGNOSIS — I10 Essential (primary) hypertension: Secondary | ICD-10-CM | POA: Diagnosis not present

## 2020-08-14 DIAGNOSIS — M479 Spondylosis, unspecified: Secondary | ICD-10-CM | POA: Diagnosis not present

## 2020-08-14 DIAGNOSIS — S8001XD Contusion of right knee, subsequent encounter: Secondary | ICD-10-CM | POA: Diagnosis not present

## 2020-08-15 ENCOUNTER — Telehealth: Payer: Self-pay | Admitting: Registered Nurse

## 2020-08-15 DIAGNOSIS — G4733 Obstructive sleep apnea (adult) (pediatric): Secondary | ICD-10-CM | POA: Diagnosis not present

## 2020-08-15 DIAGNOSIS — K219 Gastro-esophageal reflux disease without esophagitis: Secondary | ICD-10-CM | POA: Diagnosis not present

## 2020-08-15 DIAGNOSIS — J45909 Unspecified asthma, uncomplicated: Secondary | ICD-10-CM | POA: Diagnosis not present

## 2020-08-15 DIAGNOSIS — M479 Spondylosis, unspecified: Secondary | ICD-10-CM | POA: Diagnosis not present

## 2020-08-15 DIAGNOSIS — I69153 Hemiplegia and hemiparesis following nontraumatic intracerebral hemorrhage affecting right non-dominant side: Secondary | ICD-10-CM | POA: Diagnosis not present

## 2020-08-15 DIAGNOSIS — S8001XD Contusion of right knee, subsequent encounter: Secondary | ICD-10-CM | POA: Diagnosis not present

## 2020-08-15 DIAGNOSIS — G5600 Carpal tunnel syndrome, unspecified upper limb: Secondary | ICD-10-CM | POA: Diagnosis not present

## 2020-08-15 DIAGNOSIS — M170 Bilateral primary osteoarthritis of knee: Secondary | ICD-10-CM | POA: Diagnosis not present

## 2020-08-15 DIAGNOSIS — I10 Essential (primary) hypertension: Secondary | ICD-10-CM | POA: Diagnosis not present

## 2020-08-15 NOTE — Telephone Encounter (Signed)
Transitional Care call Transitional Questions answerd by [name]   Patient name: Kenneth Harper  DOB: 1941-07-05 1. Are you/is patient experiencing any problems s since coming home? No a. Are there any questions regarding any aspect of care? No 2. Are there any questions regarding medications administration/dosing? No a. Are meds being taken as prescribed? Yes b. "Patient should review meds with caller to confirm" Medication List Reviewed. 3. Have there been any falls? No 4. Has Home Health been to the house and/or have they contacted you? Yes, [facility]  a. If not, have you tried to contact them? NA b. Can we help you contact them? NA 5. Are bowels and bladder emptying properly? Yes a. Are there any unexpected incontinence issues? No b. If applicable, is patient following bowel/bladder programs? NA 6. Any fevers, problems with breathing, unexpected pain? No 7. Are there any skin problems or new areas of breakdown? No 8. Has the patient/family member arranged specialty MD follow up (ie cardiology/neurology/renal/surgical/etc.)?  This provider called [facility], they will call [name] to schedule HFU appointment. He has an appointment scheduled with Dr Lysle Rubens a. Can we help arrange? No 9. Does the patient need any other services or support that we can help arrange? No 10. Are caregivers following through as expected in assisting the patient? Yes 11. Has the patient quit smoking, drinking alcohol, or using drugs as recommended? (                        )  Appointment date/time 08/26/2020  arrival time 10:00 for 10:20, with Bayard Hugger ANP-C. At [facility]

## 2020-08-16 ENCOUNTER — Telehealth: Payer: Self-pay | Admitting: *Deleted

## 2020-08-16 DIAGNOSIS — K219 Gastro-esophageal reflux disease without esophagitis: Secondary | ICD-10-CM | POA: Diagnosis not present

## 2020-08-16 DIAGNOSIS — I10 Essential (primary) hypertension: Secondary | ICD-10-CM | POA: Diagnosis not present

## 2020-08-16 DIAGNOSIS — G5600 Carpal tunnel syndrome, unspecified upper limb: Secondary | ICD-10-CM | POA: Diagnosis not present

## 2020-08-16 DIAGNOSIS — G4733 Obstructive sleep apnea (adult) (pediatric): Secondary | ICD-10-CM | POA: Diagnosis not present

## 2020-08-16 DIAGNOSIS — I69153 Hemiplegia and hemiparesis following nontraumatic intracerebral hemorrhage affecting right non-dominant side: Secondary | ICD-10-CM | POA: Diagnosis not present

## 2020-08-16 DIAGNOSIS — M479 Spondylosis, unspecified: Secondary | ICD-10-CM | POA: Diagnosis not present

## 2020-08-16 DIAGNOSIS — S8001XD Contusion of right knee, subsequent encounter: Secondary | ICD-10-CM | POA: Diagnosis not present

## 2020-08-16 DIAGNOSIS — M170 Bilateral primary osteoarthritis of knee: Secondary | ICD-10-CM | POA: Diagnosis not present

## 2020-08-16 DIAGNOSIS — J45909 Unspecified asthma, uncomplicated: Secondary | ICD-10-CM | POA: Diagnosis not present

## 2020-08-16 NOTE — Telephone Encounter (Signed)
Don OT with Kenneth Harper called for POC 1wk2, 0wk1, 1wk1.  Approval given.

## 2020-08-20 DIAGNOSIS — G4733 Obstructive sleep apnea (adult) (pediatric): Secondary | ICD-10-CM | POA: Diagnosis not present

## 2020-08-20 DIAGNOSIS — I10 Essential (primary) hypertension: Secondary | ICD-10-CM | POA: Diagnosis not present

## 2020-08-20 DIAGNOSIS — J45909 Unspecified asthma, uncomplicated: Secondary | ICD-10-CM | POA: Diagnosis not present

## 2020-08-20 DIAGNOSIS — M170 Bilateral primary osteoarthritis of knee: Secondary | ICD-10-CM | POA: Diagnosis not present

## 2020-08-20 DIAGNOSIS — G5600 Carpal tunnel syndrome, unspecified upper limb: Secondary | ICD-10-CM | POA: Diagnosis not present

## 2020-08-20 DIAGNOSIS — K219 Gastro-esophageal reflux disease without esophagitis: Secondary | ICD-10-CM | POA: Diagnosis not present

## 2020-08-20 DIAGNOSIS — M479 Spondylosis, unspecified: Secondary | ICD-10-CM | POA: Diagnosis not present

## 2020-08-20 DIAGNOSIS — I69153 Hemiplegia and hemiparesis following nontraumatic intracerebral hemorrhage affecting right non-dominant side: Secondary | ICD-10-CM | POA: Diagnosis not present

## 2020-08-20 DIAGNOSIS — S8001XD Contusion of right knee, subsequent encounter: Secondary | ICD-10-CM | POA: Diagnosis not present

## 2020-08-21 DIAGNOSIS — K219 Gastro-esophageal reflux disease without esophagitis: Secondary | ICD-10-CM | POA: Diagnosis not present

## 2020-08-21 DIAGNOSIS — G5600 Carpal tunnel syndrome, unspecified upper limb: Secondary | ICD-10-CM | POA: Diagnosis not present

## 2020-08-21 DIAGNOSIS — M170 Bilateral primary osteoarthritis of knee: Secondary | ICD-10-CM | POA: Diagnosis not present

## 2020-08-21 DIAGNOSIS — I10 Essential (primary) hypertension: Secondary | ICD-10-CM | POA: Diagnosis not present

## 2020-08-21 DIAGNOSIS — M479 Spondylosis, unspecified: Secondary | ICD-10-CM | POA: Diagnosis not present

## 2020-08-21 DIAGNOSIS — I619 Nontraumatic intracerebral hemorrhage, unspecified: Secondary | ICD-10-CM | POA: Diagnosis not present

## 2020-08-21 DIAGNOSIS — I69153 Hemiplegia and hemiparesis following nontraumatic intracerebral hemorrhage affecting right non-dominant side: Secondary | ICD-10-CM | POA: Diagnosis not present

## 2020-08-21 DIAGNOSIS — G4733 Obstructive sleep apnea (adult) (pediatric): Secondary | ICD-10-CM | POA: Diagnosis not present

## 2020-08-21 DIAGNOSIS — S8001XD Contusion of right knee, subsequent encounter: Secondary | ICD-10-CM | POA: Diagnosis not present

## 2020-08-21 DIAGNOSIS — J45909 Unspecified asthma, uncomplicated: Secondary | ICD-10-CM | POA: Diagnosis not present

## 2020-08-22 DIAGNOSIS — I10 Essential (primary) hypertension: Secondary | ICD-10-CM | POA: Diagnosis not present

## 2020-08-22 DIAGNOSIS — K219 Gastro-esophageal reflux disease without esophagitis: Secondary | ICD-10-CM | POA: Diagnosis not present

## 2020-08-22 DIAGNOSIS — S8001XD Contusion of right knee, subsequent encounter: Secondary | ICD-10-CM | POA: Diagnosis not present

## 2020-08-22 DIAGNOSIS — G5600 Carpal tunnel syndrome, unspecified upper limb: Secondary | ICD-10-CM | POA: Diagnosis not present

## 2020-08-22 DIAGNOSIS — I69153 Hemiplegia and hemiparesis following nontraumatic intracerebral hemorrhage affecting right non-dominant side: Secondary | ICD-10-CM | POA: Diagnosis not present

## 2020-08-22 DIAGNOSIS — M479 Spondylosis, unspecified: Secondary | ICD-10-CM | POA: Diagnosis not present

## 2020-08-22 DIAGNOSIS — J45909 Unspecified asthma, uncomplicated: Secondary | ICD-10-CM | POA: Diagnosis not present

## 2020-08-22 DIAGNOSIS — G4733 Obstructive sleep apnea (adult) (pediatric): Secondary | ICD-10-CM | POA: Diagnosis not present

## 2020-08-22 DIAGNOSIS — M170 Bilateral primary osteoarthritis of knee: Secondary | ICD-10-CM | POA: Diagnosis not present

## 2020-08-26 ENCOUNTER — Other Ambulatory Visit: Payer: Self-pay

## 2020-08-26 ENCOUNTER — Encounter: Payer: Medicare HMO | Attending: Registered Nurse | Admitting: Registered Nurse

## 2020-08-26 ENCOUNTER — Encounter: Payer: Self-pay | Admitting: Registered Nurse

## 2020-08-26 VITALS — BP 128/80 | HR 99 | Temp 97.7°F | Ht 70.0 in

## 2020-08-26 DIAGNOSIS — I10 Essential (primary) hypertension: Secondary | ICD-10-CM | POA: Diagnosis not present

## 2020-08-26 DIAGNOSIS — I619 Nontraumatic intracerebral hemorrhage, unspecified: Secondary | ICD-10-CM

## 2020-08-26 DIAGNOSIS — E785 Hyperlipidemia, unspecified: Secondary | ICD-10-CM

## 2020-08-26 NOTE — Progress Notes (Signed)
Subjective:    Patient ID: Kenneth Harper, male    DOB: 1940-08-18, 80 y.o.   MRN: [id number]  HPI : Kenneth Harper is a 80 y.o. male who is here for Transitional Care Visit of his Thalamic Hemorrhage, Essential Hypertension and Dyslipidemia. [name] went to [facility] on 07/31/2020, he presented with complaints of numbness. Neurology following.  CT Head WO Contrast:  IMPRESSION: 6 cc acute hematoma in the left thalamus. Small volume intraventricular extension without hydrocephalus.  MRI Brain:  IMPRESSION: 1. No significant interval change in size and morphology of acute hemorrhage centered at the left thalamus. Mild surrounding vasogenic edema without significant regional mass effect. Associated intraventricular extension without hydrocephalus. 2. No other acute intracranial abnormality. 3. Age-related cerebral atrophy with mild chronic small vessel ischemic disease.  He was admitted to inpatient rehabilitation on 08/06/2020 and discharged home on 08/13/2020. He is receiving Home Health Therapy with [facility]. He denies any pain. He rates his pain 0. Also reports his appetite is fair.   Son in room.  Pain Inventory Average Pain 0 Pain Right Now 0 My pain is no pain  LOCATION OF PAIN  no pain  BOWEL Number of stools per week: 7 Oral laxative use Yes  Type of laxative senna Enema or suppository use No  History of colostomy No  Incontinent No   BLADDER Normal In and out cath, frequency  n/a Able to self cath No  Bladder incontinence No  Frequent urination No  Leakage with coughing No  Difficulty starting stream No  Incomplete bladder emptying No    Mobility walk without assistance walk with assistance use a cane  Function employed # of hrs/week .  Neuro/Psych No problems in this area  Prior Studies Any changes since last visit?  no  Physicians involved in your care Any changes since last visit?  no   Family History  Problem Relation  Age of Onset  . Parkinson's disease Mother   . Heart failure Father   . Diabetes Brother   . Cancer Brother        throat  . Hydrocephalus Brother   . Diabetes Brother   . Migraines Sister    Social History   Socioeconomic History  . Marital status: Married    Spouse name: Not on file  . Number of children: Not on file  . Years of education: Not on file  . Highest education level: Not on file  Occupational History  . Not on file  Tobacco Use  . Smoking status: Former Smoker    Years: 42.00    Types: Cigars    Quit date: 10/22/2010    Years since quitting: 9.8  . Smokeless tobacco: Never Used  Vaping Use  . Vaping Use: Never used  Substance and Sexual Activity  . Alcohol use: No    Alcohol/week: 0.0 standard drinks  . Drug use: No  . Sexual activity: Never    Birth control/protection: Abstinence  Other Topics Concern  . Not on file  Social History Narrative   Lives home with wife.  Works at [employer].  Education HS.  Children 2. Caffeine 2-4 cups daily.     Social Determinants of Health   Financial Resource Strain: Not on file  Food Insecurity: Not on file  Transportation Needs: Not on file  Physical Activity: Not on file  Stress: Not on file  Social Connections: Not on file   Past Surgical History:  Procedure Laterality Date  .  CATARACT EXTRACTION, BILATERAL Bilateral   . COLONOSCOPY WITH PROPOFOL N/A 10/29/2014   Procedure: COLONOSCOPY WITH PROPOFOL;  Surgeon: Garlan Fair, MD;  Location: [facility];  Service: Endoscopy;  Laterality: N/A;  . EYE SURGERY    . LUMBAR LAMINECTOMY/DECOMPRESSION MICRODISCECTOMY N/A 10/01/2015   Procedure: LEFT L2-3 MICRODISCECTOMY;  Surgeon: Jessy Oto, MD;  Location: [facility];  Service: Orthopedics;  Laterality: N/A;   Past Medical History:  Diagnosis Date  . Arthritis    knees and back  . Asthma    childhood  . Carpal tunnel syndrome   . Cataract   . Diverticulitis   . GERD (gastroesophageal reflux  disease)   . Hypercholesterolemia   . Memory loss    Temp 97.7 F (36.5 C)   Ht 5\' 10"  (1.778 m)   BMI 32.43 kg/m   Opioid Risk Score:   Fall Risk Score:  `1  Depression screen PHQ 2/9  Depression screen PHQ 2/9 08/26/2020  Decreased Interest 0  Down, Depressed, Hopeless 0  PHQ - 2 Score 0  Altered sleeping 0  Tired, decreased energy 0  Change in appetite 0  Feeling bad or failure about yourself  0  Trouble concentrating 0  Moving slowly or fidgety/restless 0  Suicidal thoughts 0  PHQ-9 Score 0    Review of Systems  Constitutional: Negative.   HENT: Negative.   Eyes: Negative.   Respiratory: Negative.   Cardiovascular: Negative.   Gastrointestinal: Negative.   Endocrine: Negative.   Genitourinary: Negative.   Musculoskeletal: Negative.   Skin: Negative.   Allergic/Immunologic: Negative.   Neurological: Negative.   Hematological: Negative.   Psychiatric/Behavioral: Negative.   All other systems reviewed and are negative.      Objective:   Physical Exam Vitals and nursing note reviewed.  Constitutional:      Appearance: Normal appearance.  Cardiovascular:     Rate and Rhythm: Normal rate and regular rhythm.     Pulses: Normal pulses.     Heart sounds: Normal heart sounds.  Pulmonary:     Effort: Pulmonary effort is normal.     Breath sounds: Normal breath sounds.  Musculoskeletal:     Cervical back: Normal range of motion and neck supple.     Comments: Normal Muscle Bulk and Muscle Testing Reveals:  Upper Extremities: Full ROM and Muscle Strength 5/5 Lower Extremities: Full ROM and Muscle Strength 5/5 Wearing Bilateral Knee sleeves Arises from Table slowly using cane for support Narrow Based  Gait   Skin:    General: Skin is warm and dry.  Neurological:     Mental Status: He is alert and oriented to person, place, and time.  Psychiatric:        Mood and Affect: Mood normal.        Behavior: Behavior normal.           Assessment & Plan:   1.Thalamic Hemorrhage: Continue Home Health Therapy with [facility]. He has an appointment with [facility].   2.Essential Hypertension: Continue current medication regimen. PCP Following . Continue to monitor.  3. Dyslipidemia: Continue current medication regimen. PCP Following. Continue to Monitor.   F/U with Dr Posey Pronto in 2 weeks.  Mr. Delarocha wants to resume driving, this provider spoke with Dr Posey Pronto regarding the above. He will be scheduled with Dr Posey Pronto in 2 weeks, he verbalizes understanding.

## 2020-08-28 DIAGNOSIS — G5600 Carpal tunnel syndrome, unspecified upper limb: Secondary | ICD-10-CM | POA: Diagnosis not present

## 2020-08-28 DIAGNOSIS — I69153 Hemiplegia and hemiparesis following nontraumatic intracerebral hemorrhage affecting right non-dominant side: Secondary | ICD-10-CM | POA: Diagnosis not present

## 2020-08-28 DIAGNOSIS — J45909 Unspecified asthma, uncomplicated: Secondary | ICD-10-CM | POA: Diagnosis not present

## 2020-08-28 DIAGNOSIS — K219 Gastro-esophageal reflux disease without esophagitis: Secondary | ICD-10-CM | POA: Diagnosis not present

## 2020-08-28 DIAGNOSIS — G4733 Obstructive sleep apnea (adult) (pediatric): Secondary | ICD-10-CM | POA: Diagnosis not present

## 2020-08-28 DIAGNOSIS — M479 Spondylosis, unspecified: Secondary | ICD-10-CM | POA: Diagnosis not present

## 2020-08-28 DIAGNOSIS — M170 Bilateral primary osteoarthritis of knee: Secondary | ICD-10-CM | POA: Diagnosis not present

## 2020-08-28 DIAGNOSIS — I10 Essential (primary) hypertension: Secondary | ICD-10-CM | POA: Diagnosis not present

## 2020-08-28 DIAGNOSIS — S8001XD Contusion of right knee, subsequent encounter: Secondary | ICD-10-CM | POA: Diagnosis not present

## 2020-08-29 DIAGNOSIS — R7303 Prediabetes: Secondary | ICD-10-CM

## 2020-08-29 DIAGNOSIS — E871 Hypo-osmolality and hyponatremia: Secondary | ICD-10-CM

## 2020-08-29 DIAGNOSIS — E7800 Pure hypercholesterolemia, unspecified: Secondary | ICD-10-CM

## 2020-08-29 DIAGNOSIS — S8001XD Contusion of right knee, subsequent encounter: Secondary | ICD-10-CM | POA: Diagnosis not present

## 2020-08-29 DIAGNOSIS — M479 Spondylosis, unspecified: Secondary | ICD-10-CM | POA: Diagnosis not present

## 2020-08-29 DIAGNOSIS — I69153 Hemiplegia and hemiparesis following nontraumatic intracerebral hemorrhage affecting right non-dominant side: Secondary | ICD-10-CM | POA: Diagnosis not present

## 2020-08-29 DIAGNOSIS — K219 Gastro-esophageal reflux disease without esophagitis: Secondary | ICD-10-CM | POA: Diagnosis not present

## 2020-08-29 DIAGNOSIS — G4733 Obstructive sleep apnea (adult) (pediatric): Secondary | ICD-10-CM | POA: Diagnosis not present

## 2020-08-29 DIAGNOSIS — M170 Bilateral primary osteoarthritis of knee: Secondary | ICD-10-CM | POA: Diagnosis not present

## 2020-08-29 DIAGNOSIS — S8010XD Contusion of unspecified lower leg, subsequent encounter: Secondary | ICD-10-CM | POA: Diagnosis not present

## 2020-08-29 DIAGNOSIS — G5600 Carpal tunnel syndrome, unspecified upper limb: Secondary | ICD-10-CM | POA: Diagnosis not present

## 2020-08-29 DIAGNOSIS — Z9181 History of falling: Secondary | ICD-10-CM

## 2020-08-29 DIAGNOSIS — Z87891 Personal history of nicotine dependence: Secondary | ICD-10-CM

## 2020-08-29 DIAGNOSIS — I10 Essential (primary) hypertension: Secondary | ICD-10-CM | POA: Diagnosis not present

## 2020-08-29 DIAGNOSIS — J45909 Unspecified asthma, uncomplicated: Secondary | ICD-10-CM | POA: Diagnosis not present

## 2020-08-30 DIAGNOSIS — M479 Spondylosis, unspecified: Secondary | ICD-10-CM | POA: Diagnosis not present

## 2020-08-30 DIAGNOSIS — I69153 Hemiplegia and hemiparesis following nontraumatic intracerebral hemorrhage affecting right non-dominant side: Secondary | ICD-10-CM | POA: Diagnosis not present

## 2020-08-30 DIAGNOSIS — M170 Bilateral primary osteoarthritis of knee: Secondary | ICD-10-CM | POA: Diagnosis not present

## 2020-08-30 DIAGNOSIS — I10 Essential (primary) hypertension: Secondary | ICD-10-CM | POA: Diagnosis not present

## 2020-08-30 DIAGNOSIS — K219 Gastro-esophageal reflux disease without esophagitis: Secondary | ICD-10-CM | POA: Diagnosis not present

## 2020-08-30 DIAGNOSIS — G5600 Carpal tunnel syndrome, unspecified upper limb: Secondary | ICD-10-CM | POA: Diagnosis not present

## 2020-08-30 DIAGNOSIS — G4733 Obstructive sleep apnea (adult) (pediatric): Secondary | ICD-10-CM | POA: Diagnosis not present

## 2020-08-30 DIAGNOSIS — S8001XD Contusion of right knee, subsequent encounter: Secondary | ICD-10-CM | POA: Diagnosis not present

## 2020-08-30 DIAGNOSIS — J45909 Unspecified asthma, uncomplicated: Secondary | ICD-10-CM | POA: Diagnosis not present

## 2020-09-02 ENCOUNTER — Telehealth: Payer: Self-pay

## 2020-09-02 NOTE — Telephone Encounter (Signed)
Per protocol hospital discharger notes reviewed: Okay given to [facility] with [facility] to provide speech once a week for 4 weeks. To allow Kenneth Harper to regain strength between sessions.  CB# [phone].

## 2020-09-03 ENCOUNTER — Encounter: Payer: Self-pay | Admitting: Diagnostic Neuroimaging

## 2020-09-03 ENCOUNTER — Ambulatory Visit: Payer: Medicare HMO | Admitting: Diagnostic Neuroimaging

## 2020-09-03 VITALS — BP 144/80 | HR 76 | Ht 70.0 in | Wt 223.6 lb

## 2020-09-03 DIAGNOSIS — I619 Nontraumatic intracerebral hemorrhage, unspecified: Secondary | ICD-10-CM

## 2020-09-03 DIAGNOSIS — J45909 Unspecified asthma, uncomplicated: Secondary | ICD-10-CM | POA: Diagnosis not present

## 2020-09-03 DIAGNOSIS — M479 Spondylosis, unspecified: Secondary | ICD-10-CM | POA: Diagnosis not present

## 2020-09-03 DIAGNOSIS — S8001XD Contusion of right knee, subsequent encounter: Secondary | ICD-10-CM | POA: Diagnosis not present

## 2020-09-03 DIAGNOSIS — G5600 Carpal tunnel syndrome, unspecified upper limb: Secondary | ICD-10-CM | POA: Diagnosis not present

## 2020-09-03 DIAGNOSIS — I10 Essential (primary) hypertension: Secondary | ICD-10-CM | POA: Diagnosis not present

## 2020-09-03 DIAGNOSIS — K219 Gastro-esophageal reflux disease without esophagitis: Secondary | ICD-10-CM | POA: Diagnosis not present

## 2020-09-03 DIAGNOSIS — G4733 Obstructive sleep apnea (adult) (pediatric): Secondary | ICD-10-CM | POA: Diagnosis not present

## 2020-09-03 DIAGNOSIS — M170 Bilateral primary osteoarthritis of knee: Secondary | ICD-10-CM | POA: Diagnosis not present

## 2020-09-03 DIAGNOSIS — I69153 Hemiplegia and hemiparesis following nontraumatic intracerebral hemorrhage affecting right non-dominant side: Secondary | ICD-10-CM | POA: Diagnosis not present

## 2020-09-03 NOTE — Patient Instructions (Signed)
-   check CT head

## 2020-09-03 NOTE — Progress Notes (Signed)
[facility]  PATIENT: Kenneth Harper DOB: 1940-08-28  REFERRING CLINICIAN: Tommie Ard HISTORY FROM: patient and daughter REASON FOR VISIT: follow up   HISTORICAL  CHIEF COMPLAINT:  Chief Complaint  Patient presents with  . Stroke    Rm 7 New Pt, hospital FU, dgtr- Cindy  "PT ongoing, see Dr Posey Pronto soon and hope he'll release me"    HISTORY OF PRESENT ILLNESS:   NEW HPI 09/03/20: 80 year old male with memory loss, OSA, presented to hospital on 07/31/2020 for numbness and falling down.  CT scan showed left thalamic anterior cerebral hemorrhage, likely due to hypertensive emergency.  Patient has done well after inpatient rehabilitation.  He is doing some ongoing PT at home.  Blood pressure is better controlled at home.  Overall doing well.  PRIOR HPI (03/28/18): 80 year old male here for evaluation of short-term memory loss.  Patient reports at least 1 to 2 years of gradual onset, fairly stable short-term memory loss.  Sometimes he forgets recent conversations, names of people or other recent events.  Otherwise he is still working and doing well in his job that he has been doing for more than 30 years.  He is able to maintain all activities of daily living.  He also helps take care of his wife who is sick frequently.  No problems with driving, shopping, finances or any other issues.  Patient also has chronic low back pain issues, status post surgery which has significantly helped.  Patient also has long history of restless leg syndrome symptoms.  Patient's wife is noticed some intermittent leg jerks and kicks in his sleep.  Patient has history of chronic insomnia, averaging 4 hours of sleep per night for many years.  In the last 1 year his sleep has slightly improved and now he is averaging 6 to 8 hours of sleep per night.   REVIEW OF SYSTEMS: Full 14 system review of systems performed and negative with exception of: Memory loss restless legs easy bruising cough joint  pain runny nose impotence.  ALLERGIES: No Known Allergies  HOME MEDICATIONS: Outpatient Medications Prior to Visit  Medication Sig Dispense Refill  . acetaminophen (TYLENOL) 325 MG tablet Take 1-2 tablets (325-650 mg total) by mouth every 4 (four) hours as needed for mild pain.    Marland Kitchen amLODipine (NORVASC) 5 MG tablet Take 1 tablet (5 mg total) by mouth daily. 30 tablet 0  . atorvastatin (LIPITOR) 40 MG tablet Take 40 mg by mouth daily.   1  . diclofenac Sodium (VOLTAREN) 1 % GEL Apply 2 g topically 4 (four) times daily. 350 g 0  . senna-docusate (SENOKOT-S) 8.6-50 MG tablet Take 1 tablet by mouth 2 (two) times daily. 630 tablet 0   No facility-administered medications prior to visit.    PAST MEDICAL HISTORY: Past Medical History:  Diagnosis Date  . Arthritis    knees and back  . Asthma    childhood  . Carpal tunnel syndrome   . Cataract   . Diverticulitis   . GERD (gastroesophageal reflux disease)   . Hypercholesterolemia   . Memory loss   . Stroke [facility]) 07/31/2020    PAST SURGICAL HISTORY: Past Surgical History:  Procedure Laterality Date  . BILATERAL CARPAL TUNNEL RELEASE    . CATARACT EXTRACTION, BILATERAL Bilateral   . COLONOSCOPY WITH PROPOFOL N/A 10/29/2014   Procedure: COLONOSCOPY WITH PROPOFOL;  Surgeon: Garlan Fair, MD;  Location: [facility];  Service: Endoscopy;  Laterality: N/A;  . EYE SURGERY    .  LUMBAR LAMINECTOMY/DECOMPRESSION MICRODISCECTOMY N/A 10/01/2015   Procedure: LEFT L2-3 MICRODISCECTOMY;  Surgeon: Jessy Oto, MD;  Location: [place];  Service: Orthopedics;  Laterality: N/A;    FAMILY HISTORY: Family History  Problem Relation Age of Onset  . Parkinson's disease Mother   . Heart failure Father   . Diabetes Brother   . Cancer Brother        throat  . Hydrocephalus Brother   . Diabetes Brother   . Migraines Sister     SOCIAL HISTORY: Social History   Socioeconomic History  . Marital status: Married    Spouse name: Not on file  .  Number of children: 2  . Years of education: 37  . Highest education level: Not on file  Occupational History  . Not on file  Tobacco Use  . Smoking status: Former Smoker    Years: 42.00    Types: Cigars    Quit date: 10/22/2010    Years since quitting: 9.8  . Smokeless tobacco: Never Used  Vaping Use  . Vaping Use: Never used  Substance and Sexual Activity  . Alcohol use: No    Alcohol/week: 0.0 standard drinks  . Drug use: No  . Sexual activity: Never    Birth control/protection: Abstinence  Other Topics Concern  . Not on file  Social History Narrative   Lives home with wife.  Works at [employer].  Education HS.  Children 2.    Caffeine 2-4 cups daily.     Social Determinants of Health   Financial Resource Strain: Not on file  Food Insecurity: Not on file  Transportation Needs: Not on file  Physical Activity: Not on file  Stress: Not on file  Social Connections: Not on file  Intimate Partner Violence: Not on file     PHYSICAL EXAM  GENERAL EXAM/CONSTITUTIONAL: Vitals:  Vitals:   09/03/20 1039  BP: (!) 144/80  Pulse: 76  Weight: 223 lb 9.6 oz (101.4 kg)  Height: 5\' 10"  (1.778 m)   Body mass index is 32.08 kg/m. Wt Readings from Last 3 Encounters:  09/03/20 223 lb 9.6 oz (101.4 kg)  08/06/20 226 lb (102.5 kg)  07/31/20 230 lb (104.3 kg)    Patient is in no distress; well developed, nourished and groomed; neck is supple  CARDIOVASCULAR:  Examination of carotid arteries is normal; no carotid bruits  Regular rate and rhythm, no murmurs  Examination of peripheral vascular system by observation and palpation is normal  EYES:  Ophthalmoscopic exam of optic discs and posterior segments is normal; no papilledema or hemorrhages No exam data present  MUSCULOSKELETAL:  Gait, strength, tone, movements noted in Neurologic exam below  NEUROLOGIC: MENTAL STATUS:  MMSE - Mini Mental State Exam 03/28/2018  Orientation to time 5  Orientation to  Place 5  Registration 3  Attention/ Calculation 5  Recall 2  Language- name 2 objects 2  Language- repeat 1  Language- follow 3 step command 2  Language- read & follow direction 1  Write a sentence 1  Copy design 1  Total score 28    awake, alert, oriented to person, place and time  recent and remote memory intact  normal attention and concentration  language fluent, comprehension intact, naming intact  fund of knowledge appropriate  CRANIAL NERVE:   2nd - no papilledema on fundoscopic exam  2nd, 3rd, 4th, 6th - pupils equal and reactive to light, visual fields full to confrontation, extraocular muscles intact, no nystagmus  5th - facial  sensation symmetric  7th - facial strength symmetric  8th - hearing intact  9th - palate elevates symmetrically, uvula midline  11th - shoulder shrug symmetric  12th - tongue protrusion midline  MOTOR:   normal bulk and tone, full strength in the BUE, BLE  SENSORY:   normal and symmetric to light touch  COORDINATION:   finger-nose-finger, fine finger movements normal  REFLEXES:   deep tendon reflexes trace and symmetric  GAIT/STATION:   narrow based gait; has 4 point cane     DIAGNOSTIC DATA (LABS, IMAGING, TESTING) - I reviewed patient records, labs, notes, testing and imaging myself where available.  Lab Results  Component Value Date   WBC 5.9 08/12/2020   HGB 13.8 08/12/2020   HCT 40.2 08/12/2020   MCV 88.7 08/12/2020   PLT 302 08/12/2020      Component Value Date/Time   NA 139 08/12/2020 0601   K 3.6 08/12/2020 0601   CL 107 08/12/2020 0601   CO2 24 08/12/2020 0601   GLUCOSE 125 (H) 08/12/2020 0601   BUN 16 08/12/2020 0601   CREATININE 1.22 08/12/2020 0601   CALCIUM 8.9 08/12/2020 0601   PROT 6.5 08/12/2020 1042   ALBUMIN 3.1 (L) 08/12/2020 1042   AST 22 08/12/2020 1042   ALT 81 (H) 08/12/2020 1042   ALKPHOS 72 08/12/2020 1042   BILITOT 0.7 08/12/2020 1042   GFRNONAA >60 08/12/2020 0601    GFRAA 45 (L) 04/05/2018 1655   Lab Results  Component Value Date   CHOL 123 08/01/2020   HDL 34 (L) 08/01/2020   LDLCALC 72 08/01/2020   TRIG 86 08/01/2020   CHOLHDL 3.6 08/01/2020   Lab Results  Component Value Date   HGBA1C 5.8 (H) 08/01/2020   Lab Results  Component Value Date   DGUYQIHK74 259 03/28/2018   Lab Results  Component Value Date   TSH 1.920 03/28/2018    08/11/15 MRI lumbar spine [I reviewed images myself and agree with interpretation. -VRP]  - The significant finding in this case is probably a left posterior lateral to foraminal disc herniation at L2-3 likely to compress the left L2 nerve root. - Mild multifactorial stenosis L3-4 because of facet degeneration and hypertrophy, 2 mm of anterolisthesis and bulging of the disc. No visible neural compression. - Chronic degenerative disc disease and degenerative facet disease at L4-5 with narrowing of the lateral recesses and foramina right more than left. - Chronic degenerative disc disease and degenerative facet disease at L5-S1. Some foraminal narrowing on the left because of osteophytic encroachment. - Somewhat hypercellular marrow pattern. This may be an normal variant or could be seen in the setting of anemia or other hematopoietic pathology.  07/31/20 CT head [I reviewed images myself and agree with interpretation. -VRP]  - 6 cc acute hematoma in the left thalamus. Small volume intraventricular extension without hydrocephalus.  07/31/20 MRI brain [I reviewed images myself and agree with interpretation. -VRP]  1. No significant interval change in size and morphology of acute hemorrhage centered at the left thalamus. Mild surrounding vasogenic edema without significant regional mass effect. Associated intraventricular extension without hydrocephalus. 2. No other acute intracranial abnormality. 3. Age-related cerebral atrophy with mild chronic small vessel ischemic disease.    ASSESSMENT AND PLAN  80  y.o. year old male here with:   Dx:  1. Thalamic hemorrhage with stroke [facility])      PLAN:  LEFT THALAMIC ICH (07/31/20, due to hypertension) - repeat CT head to ensure resolution of ICH (and  rule out underlying mass) - continue PT/OT at home - continuie BP control; follow up with PCP  Orders Placed This Encounter  Procedures  . CT HEAD WO CONTRAST   Return for return to PCP, pending if symptoms worsen or fail to improve.    Penni Bombard, MD 01/07/3150, 76:16 AM Certified in Neurology, Neurophysiology and Neuroimaging  [facility] [address], [place], [place] [address] [phone]

## 2020-09-04 ENCOUNTER — Telehealth: Payer: Self-pay | Admitting: Diagnostic Neuroimaging

## 2020-09-04 DIAGNOSIS — K219 Gastro-esophageal reflux disease without esophagitis: Secondary | ICD-10-CM | POA: Diagnosis not present

## 2020-09-04 DIAGNOSIS — I10 Essential (primary) hypertension: Secondary | ICD-10-CM | POA: Diagnosis not present

## 2020-09-04 DIAGNOSIS — M170 Bilateral primary osteoarthritis of knee: Secondary | ICD-10-CM | POA: Diagnosis not present

## 2020-09-04 DIAGNOSIS — S8001XD Contusion of right knee, subsequent encounter: Secondary | ICD-10-CM | POA: Diagnosis not present

## 2020-09-04 DIAGNOSIS — M479 Spondylosis, unspecified: Secondary | ICD-10-CM | POA: Diagnosis not present

## 2020-09-04 DIAGNOSIS — I69153 Hemiplegia and hemiparesis following nontraumatic intracerebral hemorrhage affecting right non-dominant side: Secondary | ICD-10-CM | POA: Diagnosis not present

## 2020-09-04 DIAGNOSIS — J45909 Unspecified asthma, uncomplicated: Secondary | ICD-10-CM | POA: Diagnosis not present

## 2020-09-04 DIAGNOSIS — G5600 Carpal tunnel syndrome, unspecified upper limb: Secondary | ICD-10-CM | POA: Diagnosis not present

## 2020-09-04 DIAGNOSIS — G4733 Obstructive sleep apnea (adult) (pediatric): Secondary | ICD-10-CM | POA: Diagnosis not present

## 2020-09-04 NOTE — Telephone Encounter (Signed)
Mcarthur Rossetti Josem Kaufmann: [phone] (exp. 09/04/20 to 12/04/20) patient is scheduled at GI for 09/16/20.

## 2020-09-04 NOTE — Telephone Encounter (Signed)
[employer] pending. Uploaded notes on the portal.

## 2020-09-05 DIAGNOSIS — J45909 Unspecified asthma, uncomplicated: Secondary | ICD-10-CM | POA: Diagnosis not present

## 2020-09-05 DIAGNOSIS — M479 Spondylosis, unspecified: Secondary | ICD-10-CM | POA: Diagnosis not present

## 2020-09-05 DIAGNOSIS — M170 Bilateral primary osteoarthritis of knee: Secondary | ICD-10-CM | POA: Diagnosis not present

## 2020-09-05 DIAGNOSIS — I69153 Hemiplegia and hemiparesis following nontraumatic intracerebral hemorrhage affecting right non-dominant side: Secondary | ICD-10-CM | POA: Diagnosis not present

## 2020-09-05 DIAGNOSIS — G4733 Obstructive sleep apnea (adult) (pediatric): Secondary | ICD-10-CM | POA: Diagnosis not present

## 2020-09-05 DIAGNOSIS — G5600 Carpal tunnel syndrome, unspecified upper limb: Secondary | ICD-10-CM | POA: Diagnosis not present

## 2020-09-05 DIAGNOSIS — I10 Essential (primary) hypertension: Secondary | ICD-10-CM | POA: Diagnosis not present

## 2020-09-05 DIAGNOSIS — S8001XD Contusion of right knee, subsequent encounter: Secondary | ICD-10-CM | POA: Diagnosis not present

## 2020-09-05 DIAGNOSIS — K219 Gastro-esophageal reflux disease without esophagitis: Secondary | ICD-10-CM | POA: Diagnosis not present

## 2020-09-09 ENCOUNTER — Encounter: Payer: Medicare HMO | Admitting: Physical Medicine & Rehabilitation

## 2020-09-10 DIAGNOSIS — K219 Gastro-esophageal reflux disease without esophagitis: Secondary | ICD-10-CM | POA: Diagnosis not present

## 2020-09-10 DIAGNOSIS — I69153 Hemiplegia and hemiparesis following nontraumatic intracerebral hemorrhage affecting right non-dominant side: Secondary | ICD-10-CM | POA: Diagnosis not present

## 2020-09-10 DIAGNOSIS — J45909 Unspecified asthma, uncomplicated: Secondary | ICD-10-CM | POA: Diagnosis not present

## 2020-09-10 DIAGNOSIS — I10 Essential (primary) hypertension: Secondary | ICD-10-CM | POA: Diagnosis not present

## 2020-09-10 DIAGNOSIS — G4733 Obstructive sleep apnea (adult) (pediatric): Secondary | ICD-10-CM | POA: Diagnosis not present

## 2020-09-10 DIAGNOSIS — M479 Spondylosis, unspecified: Secondary | ICD-10-CM | POA: Diagnosis not present

## 2020-09-10 DIAGNOSIS — S8001XD Contusion of right knee, subsequent encounter: Secondary | ICD-10-CM | POA: Diagnosis not present

## 2020-09-10 DIAGNOSIS — M170 Bilateral primary osteoarthritis of knee: Secondary | ICD-10-CM | POA: Diagnosis not present

## 2020-09-10 DIAGNOSIS — G5600 Carpal tunnel syndrome, unspecified upper limb: Secondary | ICD-10-CM | POA: Diagnosis not present

## 2020-09-11 DIAGNOSIS — K219 Gastro-esophageal reflux disease without esophagitis: Secondary | ICD-10-CM | POA: Diagnosis not present

## 2020-09-11 DIAGNOSIS — I10 Essential (primary) hypertension: Secondary | ICD-10-CM | POA: Diagnosis not present

## 2020-09-11 DIAGNOSIS — M479 Spondylosis, unspecified: Secondary | ICD-10-CM | POA: Diagnosis not present

## 2020-09-11 DIAGNOSIS — I69153 Hemiplegia and hemiparesis following nontraumatic intracerebral hemorrhage affecting right non-dominant side: Secondary | ICD-10-CM | POA: Diagnosis not present

## 2020-09-11 DIAGNOSIS — J45909 Unspecified asthma, uncomplicated: Secondary | ICD-10-CM | POA: Diagnosis not present

## 2020-09-11 DIAGNOSIS — G5600 Carpal tunnel syndrome, unspecified upper limb: Secondary | ICD-10-CM | POA: Diagnosis not present

## 2020-09-11 DIAGNOSIS — G4733 Obstructive sleep apnea (adult) (pediatric): Secondary | ICD-10-CM | POA: Diagnosis not present

## 2020-09-11 DIAGNOSIS — M170 Bilateral primary osteoarthritis of knee: Secondary | ICD-10-CM | POA: Diagnosis not present

## 2020-09-11 DIAGNOSIS — S8001XD Contusion of right knee, subsequent encounter: Secondary | ICD-10-CM | POA: Diagnosis not present

## 2020-09-12 DIAGNOSIS — S8001XD Contusion of right knee, subsequent encounter: Secondary | ICD-10-CM | POA: Diagnosis not present

## 2020-09-12 DIAGNOSIS — M479 Spondylosis, unspecified: Secondary | ICD-10-CM | POA: Diagnosis not present

## 2020-09-12 DIAGNOSIS — G5600 Carpal tunnel syndrome, unspecified upper limb: Secondary | ICD-10-CM | POA: Diagnosis not present

## 2020-09-12 DIAGNOSIS — M170 Bilateral primary osteoarthritis of knee: Secondary | ICD-10-CM | POA: Diagnosis not present

## 2020-09-12 DIAGNOSIS — I69153 Hemiplegia and hemiparesis following nontraumatic intracerebral hemorrhage affecting right non-dominant side: Secondary | ICD-10-CM | POA: Diagnosis not present

## 2020-09-12 DIAGNOSIS — G4733 Obstructive sleep apnea (adult) (pediatric): Secondary | ICD-10-CM | POA: Diagnosis not present

## 2020-09-12 DIAGNOSIS — K219 Gastro-esophageal reflux disease without esophagitis: Secondary | ICD-10-CM | POA: Diagnosis not present

## 2020-09-12 DIAGNOSIS — J45909 Unspecified asthma, uncomplicated: Secondary | ICD-10-CM | POA: Diagnosis not present

## 2020-09-12 DIAGNOSIS — I10 Essential (primary) hypertension: Secondary | ICD-10-CM | POA: Diagnosis not present

## 2020-09-13 DIAGNOSIS — I619 Nontraumatic intracerebral hemorrhage, unspecified: Secondary | ICD-10-CM | POA: Diagnosis not present

## 2020-09-13 DIAGNOSIS — I10 Essential (primary) hypertension: Secondary | ICD-10-CM | POA: Diagnosis not present

## 2020-09-13 DIAGNOSIS — F341 Dysthymic disorder: Secondary | ICD-10-CM | POA: Diagnosis not present

## 2020-09-16 ENCOUNTER — Ambulatory Visit
Admission: RE | Admit: 2020-09-16 | Discharge: 2020-09-16 | Disposition: A | Discharge status: Home / Self Care | Payer: Medicare HMO | Source: Ambulatory Visit | Attending: Diagnostic Neuroimaging | Admitting: Diagnostic Neuroimaging

## 2020-09-16 DIAGNOSIS — I619 Nontraumatic intracerebral hemorrhage, unspecified: Secondary | ICD-10-CM

## 2020-09-17 DIAGNOSIS — I10 Essential (primary) hypertension: Secondary | ICD-10-CM | POA: Diagnosis not present

## 2020-09-17 DIAGNOSIS — S8001XD Contusion of right knee, subsequent encounter: Secondary | ICD-10-CM | POA: Diagnosis not present

## 2020-09-17 DIAGNOSIS — I69153 Hemiplegia and hemiparesis following nontraumatic intracerebral hemorrhage affecting right non-dominant side: Secondary | ICD-10-CM | POA: Diagnosis not present

## 2020-09-17 DIAGNOSIS — K219 Gastro-esophageal reflux disease without esophagitis: Secondary | ICD-10-CM | POA: Diagnosis not present

## 2020-09-17 DIAGNOSIS — M170 Bilateral primary osteoarthritis of knee: Secondary | ICD-10-CM | POA: Diagnosis not present

## 2020-09-17 DIAGNOSIS — J45909 Unspecified asthma, uncomplicated: Secondary | ICD-10-CM | POA: Diagnosis not present

## 2020-09-17 DIAGNOSIS — G4733 Obstructive sleep apnea (adult) (pediatric): Secondary | ICD-10-CM | POA: Diagnosis not present

## 2020-09-17 DIAGNOSIS — M479 Spondylosis, unspecified: Secondary | ICD-10-CM | POA: Diagnosis not present

## 2020-09-17 DIAGNOSIS — G5600 Carpal tunnel syndrome, unspecified upper limb: Secondary | ICD-10-CM | POA: Diagnosis not present

## 2020-09-18 ENCOUNTER — Telehealth: Payer: Self-pay | Admitting: *Deleted

## 2020-09-18 DIAGNOSIS — J45909 Unspecified asthma, uncomplicated: Secondary | ICD-10-CM | POA: Diagnosis not present

## 2020-09-18 DIAGNOSIS — I10 Essential (primary) hypertension: Secondary | ICD-10-CM | POA: Diagnosis not present

## 2020-09-18 DIAGNOSIS — I69153 Hemiplegia and hemiparesis following nontraumatic intracerebral hemorrhage affecting right non-dominant side: Secondary | ICD-10-CM | POA: Diagnosis not present

## 2020-09-18 DIAGNOSIS — M479 Spondylosis, unspecified: Secondary | ICD-10-CM | POA: Diagnosis not present

## 2020-09-18 DIAGNOSIS — M170 Bilateral primary osteoarthritis of knee: Secondary | ICD-10-CM | POA: Diagnosis not present

## 2020-09-18 DIAGNOSIS — G4733 Obstructive sleep apnea (adult) (pediatric): Secondary | ICD-10-CM | POA: Diagnosis not present

## 2020-09-18 DIAGNOSIS — K219 Gastro-esophageal reflux disease without esophagitis: Secondary | ICD-10-CM | POA: Diagnosis not present

## 2020-09-18 DIAGNOSIS — G5600 Carpal tunnel syndrome, unspecified upper limb: Secondary | ICD-10-CM | POA: Diagnosis not present

## 2020-09-18 DIAGNOSIS — S8001XD Contusion of right knee, subsequent encounter: Secondary | ICD-10-CM | POA: Diagnosis not present

## 2020-09-18 NOTE — Telephone Encounter (Signed)
Spoke with patient and informed him the CT head scan showed improved imaging results Advised he continue with Dr Gladstone Lighter plan:  - continue PT/OT at home - continuie BP control; follow up with PCP He asked if he could drive. I advised that Dr Gladstone Lighter office note does not state he shouldn't drive. Patient verbalized understanding, appreciation.

## 2020-09-19 ENCOUNTER — Telehealth: Payer: Self-pay | Admitting: *Deleted

## 2020-09-19 DIAGNOSIS — M479 Spondylosis, unspecified: Secondary | ICD-10-CM | POA: Diagnosis not present

## 2020-09-19 DIAGNOSIS — S8001XD Contusion of right knee, subsequent encounter: Secondary | ICD-10-CM | POA: Diagnosis not present

## 2020-09-19 DIAGNOSIS — I69153 Hemiplegia and hemiparesis following nontraumatic intracerebral hemorrhage affecting right non-dominant side: Secondary | ICD-10-CM | POA: Diagnosis not present

## 2020-09-19 DIAGNOSIS — I10 Essential (primary) hypertension: Secondary | ICD-10-CM | POA: Diagnosis not present

## 2020-09-19 DIAGNOSIS — G4733 Obstructive sleep apnea (adult) (pediatric): Secondary | ICD-10-CM | POA: Diagnosis not present

## 2020-09-19 DIAGNOSIS — G5600 Carpal tunnel syndrome, unspecified upper limb: Secondary | ICD-10-CM | POA: Diagnosis not present

## 2020-09-19 DIAGNOSIS — K219 Gastro-esophageal reflux disease without esophagitis: Secondary | ICD-10-CM | POA: Diagnosis not present

## 2020-09-19 DIAGNOSIS — J45909 Unspecified asthma, uncomplicated: Secondary | ICD-10-CM | POA: Diagnosis not present

## 2020-09-19 DIAGNOSIS — M170 Bilateral primary osteoarthritis of knee: Secondary | ICD-10-CM | POA: Diagnosis not present

## 2020-09-19 NOTE — Telephone Encounter (Signed)
Thank you :)

## 2020-09-19 NOTE — Telephone Encounter (Signed)
[facility] called to let Dr Posey Pronto know Kenneth Harper has met his goals of improved cognition and work finding and they have discharged him from [facility].

## 2020-10-10 ENCOUNTER — Encounter: Payer: Medicare HMO | Admitting: Physical Medicine & Rehabilitation

## 2020-10-22 DIAGNOSIS — Z8673 Personal history of transient ischemic attack (TIA), and cerebral infarction without residual deficits: Secondary | ICD-10-CM | POA: Diagnosis not present

## 2020-10-22 DIAGNOSIS — I10 Essential (primary) hypertension: Secondary | ICD-10-CM | POA: Diagnosis not present

## 2020-10-22 DIAGNOSIS — F341 Dysthymic disorder: Secondary | ICD-10-CM | POA: Diagnosis not present

## 2020-11-06 ENCOUNTER — Encounter: Payer: Self-pay | Admitting: Orthopaedic Surgery

## 2020-11-06 ENCOUNTER — Other Ambulatory Visit: Payer: Self-pay

## 2020-11-06 ENCOUNTER — Telehealth: Payer: Self-pay

## 2020-11-06 ENCOUNTER — Ambulatory Visit: Payer: Medicare HMO | Admitting: Orthopaedic Surgery

## 2020-11-06 VITALS — Ht 70.0 in | Wt 223.0 lb

## 2020-11-06 DIAGNOSIS — M170 Bilateral primary osteoarthritis of knee: Secondary | ICD-10-CM

## 2020-11-06 MED ORDER — LIDOCAINE HCL 1 % IJ SOLN
2.0000 mL | INTRAMUSCULAR | Status: AC | PRN
  Administered 2020-11-06: 2 mL

## 2020-11-06 MED ORDER — BUPIVACAINE HCL 0.25 % IJ SOLN
2.0000 mL | INTRAMUSCULAR | Status: AC | PRN
Start: 2020-11-06 — End: 2020-11-06
  Administered 2020-11-06: 2 mL via INTRA_ARTICULAR

## 2020-11-06 MED ORDER — BUPIVACAINE HCL 0.25 % IJ SOLN
2.0000 mL | INTRAMUSCULAR | Status: AC | PRN
  Administered 2020-11-06: 2 mL via INTRA_ARTICULAR

## 2020-11-06 MED ORDER — METHYLPREDNISOLONE ACETATE 40 MG/ML IJ SUSP
80.0000 mg | INTRAMUSCULAR | Status: AC | PRN
  Administered 2020-11-06: 80 mg via INTRA_ARTICULAR

## 2020-11-06 NOTE — Telephone Encounter (Signed)
Please precert for bilateral visco injections. This is Dr.Whitfield's patient. Thanks!

## 2020-11-06 NOTE — Progress Notes (Signed)
Office Visit Note   Patient: Kenneth Harper           Date of Birth: June 03, 1941           MRN: [id number] Visit Date: 11/06/2020              Requested by: Wenda Low, MD [address] [place],   [address] PCP: Wenda Low, MD   Assessment & Plan: Visit Diagnoses:  1. Bilateral primary osteoarthritis of knee     Plan: Mr. Mahmood has advanced osteoarthritis of both knees.  He has responded to cortisone in the past to some extent.  We had a long discussion regarding his knees and he like to try the cortisone again and then viscosupplementation.  I will inject both knees with cortisone today and then pre-CERT the viscosupplementation.  He had a hemorrhagic thalamic stroke earlier in the year. This patient is diagnosed with osteoarthritis of the knee(s).    Radiographs show evidence of joint space narrowing, osteophytes, subchondral sclerosis and/or subchondral cysts.  This patient has knee pain which interferes with functional and activities of daily living.    This patient has experienced inadequate response, adverse effects and/or intolerance with conservative treatments such as acetaminophen, NSAIDS, topical creams, physical therapy or regular exercise, knee bracing and/or weight loss.   This patient has experienced inadequate response or has a contraindication to intra articular steroid injections for at least 3 months.   This patient is not scheduled to have a total knee replacement within 6 months of starting treatment with viscosupplementation.   Follow-Up Instructions: Return Pre-CERT viscosupplementation.   Orders:  Orders Placed This Encounter  Procedures  . Large Joint Inj: bilateral knee   No orders of the defined types were placed in this encounter.     Procedures: Large Joint Inj: bilateral knee on 11/06/2020 10:49 AM Indications: diagnostic evaluation Details: 25 G 1.5 in needle, anteromedial approach  Arthrogram: No  Medications  (Right): 2 mL lidocaine 1 %; 80 mg methylPREDNISolone acetate 40 MG/ML; 2 mL bupivacaine 0.25 % Medications (Left): 2 mL lidocaine 1 %; 80 mg methylPREDNISolone acetate 40 MG/ML; 2 mL bupivacaine 0.25 % Consent was given by the patient. Immediately prior to procedure a time out was called to verify the correct patient, procedure, equipment, support staff and site/side marked as required. Patient was prepped and draped in the usual sterile fashion.    Had a very mild vasovagal reaction after the injections which she has had in the past.  He was fine after sitting for a while and a glass of cold water   Clinical Data: No additional findings.   Subjective: Chief Complaint  Patient presents with  . Left Knee - Pain  . Right Knee - Pain  Patient presents today for bilateral knee pain. He said that they hurt about the same. He has pain all throughout his knees. He states that he sometimes experiences swelling and giving way. He has had cortisone injections in the past and they help. He wants to get both knees injected today. He is not diabetic.   HPI  Review of Systems   Objective: Vital Signs: Ht 5\' 10"  (1.778 m)   Wt 223 lb (101.2 kg)   BMI 32.00 kg/m   Physical Exam Constitutional:      Appearance: He is well-developed.  Pulmonary:     Effort: Pulmonary effort is normal.  Skin:    General: Skin is warm and dry.  Neurological:  Mental Status: He is alert and oriented to person, place, and time.  Psychiatric:        Behavior: Behavior normal.     Ortho Exam small effusion in both knees.  Predominant medial joint pain.  Some patella crepitation but no pain with patella compression.  Full extension flexed little over 100 degrees without instability.  Specialty Comments:  No specialty comments available.  Imaging: No results found.   PMFS History: Patient Active Problem List   Diagnosis Date Noted  . Essential hypertension 08/13/2020  . Transaminitis   .  Steroid-induced hyperglycemia   . Prediabetes   . Contusion of right knee   . Thalamic hemorrhage with stroke ([place]) 08/06/2020  . Hemorrhagic stroke ([place])   . Memory loss   . Dyslipidemia   . Elevated blood pressure reading   . AKI (acute kidney injury) ([place])   . Primary osteoarthritis of right knee   . ICH (intracerebral hemorrhage) ([place]) 07/31/2020  . Bilateral primary osteoarthritis of knee 08/10/2019  . Carpal tunnel syndrome on both sides 07/28/2018  . Lumbar herniated disc 10/01/2015  . Herniation of lumbar intervertebral disc with radiculopathy 09/30/2015    Class: Acute   Past Medical History:  Diagnosis Date  . Arthritis    knees and back  . Asthma    childhood  . Carpal tunnel syndrome   . Cataract   . Diverticulitis   . GERD (gastroesophageal reflux disease)   . Hypercholesterolemia   . Memory loss   . Stroke [facility]) 07/31/2020    Family History  Problem Relation Age of Onset  . Parkinson's disease Mother   . Heart failure Father   . Diabetes Brother   . Cancer Brother        throat  . Hydrocephalus Brother   . Diabetes Brother   . Migraines Sister     Past Surgical History:  Procedure Laterality Date  . BILATERAL CARPAL TUNNEL RELEASE    . CATARACT EXTRACTION, BILATERAL Bilateral   . COLONOSCOPY WITH PROPOFOL N/A 10/29/2014   Procedure: COLONOSCOPY WITH PROPOFOL;  Surgeon: Garlan Fair, MD;  Location: [facility];  Service: Endoscopy;  Laterality: N/A;  . EYE SURGERY    . LUMBAR LAMINECTOMY/DECOMPRESSION MICRODISCECTOMY N/A 10/01/2015   Procedure: LEFT L2-3 MICRODISCECTOMY;  Surgeon: Jessy Oto, MD;  Location: [place];  Service: Orthopedics;  Laterality: N/A;   Social History   Occupational History  . Not on file  Tobacco Use  . Smoking status: Former Smoker    Years: 42.00    Types: Cigars    Quit date: 10/22/2010    Years since quitting: 10.0  . Smokeless tobacco: Never Used  Vaping Use  . Vaping Use: Never used  Substance and Sexual  Activity  . Alcohol use: No    Alcohol/week: 0.0 standard drinks  . Drug use: No  . Sexual activity: Never    Birth control/protection: Abstinence

## 2020-11-07 ENCOUNTER — Telehealth: Payer: Self-pay | Admitting: Orthopaedic Surgery

## 2020-11-07 NOTE — Telephone Encounter (Signed)
Call pt please he would like a phone call back from the nurse. He has some questions

## 2020-11-07 NOTE — Telephone Encounter (Signed)
Tried to call patient. No answer. Voicemail is full.

## 2020-11-07 NOTE — Telephone Encounter (Signed)
Pt called and states he's gonna clear out his voicemails and would like for you to give hime another call.

## 2020-11-07 NOTE — Telephone Encounter (Signed)
Spoke with patient. He is trying to find a rheumatologist for his wife. She has missed and cancelled appointments due to migraines and now cannot be seen at [facility]. I spoke with Dr.Whitfield and there is nothing he can do unfortunately. I called patient back and left a message that she could try [facility] with Dr.Beekman.

## 2020-11-11 NOTE — Telephone Encounter (Signed)
Noted  

## 2020-11-26 DIAGNOSIS — F341 Dysthymic disorder: Secondary | ICD-10-CM | POA: Diagnosis not present

## 2020-11-26 DIAGNOSIS — Z8673 Personal history of transient ischemic attack (TIA), and cerebral infarction without residual deficits: Secondary | ICD-10-CM | POA: Diagnosis not present

## 2020-11-26 DIAGNOSIS — I10 Essential (primary) hypertension: Secondary | ICD-10-CM | POA: Diagnosis not present

## 2020-12-14 ENCOUNTER — Other Ambulatory Visit: Payer: Self-pay

## 2020-12-14 ENCOUNTER — Encounter (HOSPITAL_COMMUNITY): Payer: Self-pay | Admitting: Emergency Medicine

## 2020-12-14 ENCOUNTER — Ambulatory Visit (HOSPITAL_COMMUNITY)
Admission: EM | Admit: 2020-12-14 | Discharge: 2020-12-14 | Disposition: A | Discharge status: Home / Self Care | Payer: Medicare HMO | Attending: Family Medicine | Admitting: Family Medicine

## 2020-12-14 DIAGNOSIS — J069 Acute upper respiratory infection, unspecified: Secondary | ICD-10-CM | POA: Insufficient documentation

## 2020-12-14 DIAGNOSIS — Z20822 Contact with and (suspected) exposure to covid-19: Secondary | ICD-10-CM | POA: Insufficient documentation

## 2020-12-14 DIAGNOSIS — Z79899 Other long term (current) drug therapy: Secondary | ICD-10-CM | POA: Diagnosis not present

## 2020-12-14 DIAGNOSIS — Z87891 Personal history of nicotine dependence: Secondary | ICD-10-CM | POA: Insufficient documentation

## 2020-12-14 LAB — SARS CORONAVIRUS 2 (TAT 6-24 HRS): SARS Coronavirus 2: NEGATIVE

## 2020-12-14 MED ORDER — BENZONATATE 200 MG PO CAPS: 200.0000 mg | ORAL_CAPSULE | Freq: Three times a day (TID) | ORAL | 0 refills | Status: DC | PRN

## 2020-12-14 MED ORDER — GUAIFENESIN ER 600 MG PO TB12: 600.0000 mg | ORAL_TABLET | Freq: Two times a day (BID) | ORAL | 0 refills | Status: DC | PRN

## 2020-12-14 NOTE — ED Provider Notes (Signed)
[place]    CSN: [id number] Arrival date & time: 12/14/20  1009      History   Chief Complaint Chief Complaint  Patient presents with  . Cough    HPI Kenneth Harper is a 80 y.o. male.   Patient presenting today with 2-day history of cough, sore throat, ear pressure, congestion, malaise.  Denies known fever, chills, body aches, chest pain, shortness of breath, abdominal pain, nausea vomiting or diarrhea.  States his symptoms worsen slightly yesterday so he decided to come in.  Has not tried anything over-the-counter for symptoms thus far.  Past medical history significant for prediabetes, CVA, hypertension but no known pulmonary disease.  No recent sick contacts that he is aware of.    Past Medical History:  Diagnosis Date  . Arthritis    knees and back  . Asthma    childhood  . Carpal tunnel syndrome   . Cataract   . Diverticulitis   . GERD (gastroesophageal reflux disease)   . Hypercholesterolemia   . Memory loss   . Stroke [facility]) 07/31/2020    Patient Active Problem List   Diagnosis Date Noted  . Essential hypertension 08/13/2020  . Transaminitis   . Steroid-induced hyperglycemia   . Prediabetes   . Contusion of right knee   . Thalamic hemorrhage with stroke ([place]) 08/06/2020  . Hemorrhagic stroke ([place])   . Memory loss   . Dyslipidemia   . Elevated blood pressure reading   . AKI (acute kidney injury) ([place])   . Primary osteoarthritis of right knee   . ICH (intracerebral hemorrhage) ([name]) 07/31/2020  . Bilateral primary osteoarthritis of knee 08/10/2019  . Carpal tunnel syndrome on both sides 07/28/2018  . Lumbar herniated disc 10/01/2015  . Herniation of lumbar intervertebral disc with radiculopathy 09/30/2015    Class: Acute    Past Surgical History:  Procedure Laterality Date  . BILATERAL CARPAL TUNNEL RELEASE    . CATARACT EXTRACTION, BILATERAL Bilateral   . COLONOSCOPY WITH PROPOFOL N/A 10/29/2014   Procedure: COLONOSCOPY WITH PROPOFOL;   Surgeon: Garlan Fair, MD;  [facility];  Service: Endoscopy;  Laterality: N/A;  . EYE SURGERY    . LUMBAR LAMINECTOMY/DECOMPRESSION MICRODISCECTOMY N/A 10/01/2015   Procedure: LEFT L2-3 MICRODISCECTOMY;  Surgeon: Jessy Oto, MD;  Location: [facility];  Service: Orthopedics;  Laterality: N/A;       Home Medications    Prior to Admission medications   Medication Sig Start Date End Date Taking? Authorizing Provider  amLODipine (NORVASC) 5 MG tablet Take 1 tablet (5 mg total) by mouth daily. 08/12/20  Yes Love, Ivan Anchors, PA-C  atorvastatin (LIPITOR) 40 MG tablet Take 40 mg by mouth daily.  08/22/14  Yes [provider]  benzonatate (TESSALON) 200 MG capsule Take 1 capsule (200 mg total) by mouth 3 (three) times daily as needed for cough. 12/14/20  Yes Volney American, PA-C  escitalopram (LEXAPRO) 10 MG tablet Take 1 tablet by mouth daily. 11/26/20  Yes [provider]  guaiFENesin (MUCINEX) 600 MG 12 hr tablet Take 1 tablet (600 mg total) by mouth 2 (two) times daily as needed. 12/14/20  Yes Volney American, PA-C  acetaminophen (TYLENOL) 325 MG tablet Take 1-2 tablets (325-650 mg total) by mouth every 4 (four) hours as needed for mild pain. 08/12/20   Love, Ivan Anchors, PA-C  diclofenac Sodium (VOLTAREN) 1 % GEL Apply 2 g topically 4 (four) times daily. 08/12/20   Bary Leriche, PA-C  senna-docusate (SENOKOT-S) 8.6-50 MG tablet Take 1 tablet by mouth 2 (two) times daily. 08/12/20   Bary Leriche, PA-C    Family History Family History  Problem Relation Age of Onset  . Parkinson's disease Mother   . Heart failure Father   . Diabetes Brother   . Cancer Brother        throat  . Hydrocephalus Brother   . Diabetes Brother   . Migraines Sister     Social History Social History   Tobacco Use  . Smoking status: Former Smoker    Years: 42.00    Types: Cigars    Quit date: 10/22/2010    Years since quitting: 10.1  . Smokeless tobacco: Never Used   Vaping Use  . Vaping Use: Never used  Substance Use Topics  . Alcohol use: No    Alcohol/week: 0.0 standard drinks  . Drug use: No     Allergies   Patient has no known allergies.   Review of Systems Review of Systems Per HPI  Physical Exam Triage Vital Signs ED Triage Vitals  Enc Vitals Group     BP 12/14/20 1039 122/75     Pulse Rate 12/14/20 1039 81     Resp 12/14/20 1039 20     Temp 12/14/20 1039 (!) 97.4 F (36.3 C)     Temp Source 12/14/20 1039 Oral     SpO2 12/14/20 1039 97 %     Weight --      Height --      Head Circumference --      Peak Flow --      Pain Score 12/14/20 1035 6     Pain Loc --      Pain Edu? --      Excl. in [place]? --    No data found.  Updated Vital Signs BP 122/75 (BP Location: Left Arm)   Pulse 81   Temp (!) 97.4 F (36.3 C) (Oral)   Resp 20   SpO2 97%   Visual Acuity Right Eye Distance:   Left Eye Distance:   Bilateral Distance:    Right Eye Near:   Left Eye Near:    Bilateral Near:     Physical Exam Vitals and nursing note reviewed.  Constitutional:      Appearance: Normal appearance.  HENT:     Head: Atraumatic.     Ears:     Comments: Moderate cerumen present bilateral EACs, otherwise ear exam benign with intact TMs no evidence of infection    Nose: Rhinorrhea present.     Mouth/Throat:     Mouth: Mucous membranes are moist.     Pharynx: Posterior oropharyngeal erythema present. No oropharyngeal exudate.  Eyes:     Extraocular Movements: Extraocular movements intact.     Conjunctiva/sclera: Conjunctivae normal.  Cardiovascular:     Rate and Rhythm: Normal rate and regular rhythm.  Pulmonary:     Effort: Pulmonary effort is normal. No respiratory distress.     Breath sounds: Normal breath sounds. No wheezing or rales.  Abdominal:     General: Bowel sounds are normal. There is no distension.     Palpations: Abdomen is soft.     Tenderness: There is no abdominal tenderness. There is no guarding.   Musculoskeletal:        General: Normal range of motion.     Cervical back: Normal range of motion and neck supple.  Skin:    General: Skin is warm and dry.  Neurological:     General: No focal deficit present.     Mental Status: He is oriented to person, place, and time.  Psychiatric:        Mood and Affect: Mood normal.        Thought Content: Thought content normal.        Judgment: Judgment normal.      UC Treatments / Results  Labs (all labs ordered are listed, but only abnormal results are displayed) Labs Reviewed  SARS CORONAVIRUS 2 (TAT 6-24 HRS)    EKG   Radiology No results found.  Procedures Procedures (including critical care time)  Medications Ordered in UC Medications - No data to display  Initial Impression / Assessment and Plan / UC Course  I have reviewed the triage vital signs and the nursing notes.  Pertinent labs & imaging results that were available during my care of the patient were reviewed by me and considered in my medical decision making (see chart for details).     Exam and vitals overall reassuring today, COVID PCR pending.  Will treat with Tessalon Perles, plain Mucinex and discussed supportive home care.  Return for acutely worsening symptoms.  Discussed quarantine protocol per CDC guidelines.  Patient verbalizes understanding.  Final Clinical Impressions(s) / UC Diagnoses   Final diagnoses:  Viral URI with cough   Discharge Instructions   None    ED Prescriptions    Medication Sig Dispense Auth. Provider   benzonatate (TESSALON) 200 MG capsule Take 1 capsule (200 mg total) by mouth 3 (three) times daily as needed for cough. 20 capsule Volney American, PA-C   guaiFENesin (MUCINEX) 600 MG 12 hr tablet Take 1 tablet (600 mg total) by mouth 2 (two) times daily as needed. 30 tablet Volney American, [place]     PDMP not reviewed this encounter.   Volney American, [place] 12/14/20 1117

## 2020-12-14 NOTE — ED Triage Notes (Signed)
Onset Thursday night of symptoms.  Symptoms worsened yesterday.  Complains of cough, sore throat, and ear stuffiness

## 2020-12-18 ENCOUNTER — Telehealth: Payer: Self-pay

## 2020-12-18 NOTE — Telephone Encounter (Signed)
VOB has been submitted for Orthovisc, bilateral knee. Pending BV.

## 2020-12-20 ENCOUNTER — Telehealth: Payer: Self-pay

## 2020-12-20 NOTE — Telephone Encounter (Signed)
Approved for Orthovisc series, bilateral knee. [place] Patient will be responsible for 20% OOP. Co-pay of $20.00 per date of service PA Approval# [id number] Valid 12/19/2020- 06/20/2021  Appt. 01/02/2021 with Dr. Durward Fortes

## 2020-12-24 DIAGNOSIS — H6123 Impacted cerumen, bilateral: Secondary | ICD-10-CM | POA: Diagnosis not present

## 2021-01-02 ENCOUNTER — Ambulatory Visit (INDEPENDENT_AMBULATORY_CARE_PROVIDER_SITE_OTHER): Payer: Medicare HMO | Admitting: Orthopaedic Surgery

## 2021-01-02 ENCOUNTER — Other Ambulatory Visit: Payer: Self-pay

## 2021-01-02 ENCOUNTER — Encounter: Payer: Self-pay | Admitting: Orthopaedic Surgery

## 2021-01-02 VITALS — Ht 70.0 in | Wt 223.0 lb

## 2021-01-02 DIAGNOSIS — M170 Bilateral primary osteoarthritis of knee: Secondary | ICD-10-CM

## 2021-01-02 MED ORDER — HYALURONAN 30 MG/2ML IX SOSY
30.0000 mg | PREFILLED_SYRINGE | INTRA_ARTICULAR | Status: AC | PRN
  Administered 2021-01-02: 30 mg via INTRA_ARTICULAR

## 2021-01-02 NOTE — Progress Notes (Signed)
Office Visit Note   Patient: Kenneth Harper           Date of Birth: 11-10-1940           MRN: [id number] Visit Date: 01/02/2021              Requested by: Wenda Low, MD [facility] [address],  [place] [address] PCP: Wenda Low, MD   Assessment & Plan: Visit Diagnoses:  1. Bilateral primary osteoarthritis of knee     Plan: First Orthovisc injection both knees for osteoarthritis.  Will return weekly for the next 2 weeks to complete the series  Follow-Up Instructions: Return in about 1 week (around 01/09/2021).   Orders:  Orders Placed This Encounter  Procedures   Large Joint Inj: bilateral knee   No orders of the defined types were placed in this encounter.     Procedures: Large Joint Inj: bilateral knee on 01/02/2021 1:39 PM Indications: pain and joint swelling Details: 25 G 1.5 in needle, anteromedial approach  Arthrogram: No  Medications (Right): 30 mg Hyaluronan 30 MG/2ML Medications (Left): 30 mg Hyaluronan 30 MG/2ML Outcome: tolerated well, no immediate complications Procedure, treatment alternatives, risks and benefits explained, specific risks discussed. Consent was given by the patient. Immediately prior to procedure a time out was called to verify the correct patient, procedure, equipment, support staff and site/side marked as required. Patient was prepped and draped in the usual sterile fashion.      Clinical Data: No additional findings.   Subjective: Chief Complaint  Patient presents with   Left Knee - Follow-up    Bilateral orthovisc started 01/02/2021   Right Knee - Follow-up  Patient presents today for the first orthovisc injections bilaterally.   HPI  Review of Systems   Objective: Vital Signs: Ht 5\' 10"  (1.778 m)   Wt 223 lb (101.2 kg)   BMI 32.00 kg/m   Physical Exam  Ortho Exam very minimal effusion both knees.  Lacks a few degrees to full extension but flexed over 100 degrees without instability.  Mostly  medial joint pain bilaterally.  Knees were not hot warm or red  Specialty Comments:  No specialty comments available.  Imaging: No results found.   PMFS History: Patient Active Problem List   Diagnosis Date Noted   Essential hypertension 08/13/2020   Transaminitis    Steroid-induced hyperglycemia    Prediabetes    Contusion of right knee    Thalamic hemorrhage with stroke ([place]) 08/06/2020   Hemorrhagic stroke ([facility])    Memory loss    Dyslipidemia    Elevated blood pressure reading    AKI (acute kidney injury) ([place])    Primary osteoarthritis of right knee    ICH (intracerebral hemorrhage) ([place]) 07/31/2020   Bilateral primary osteoarthritis of knee 08/10/2019   Carpal tunnel syndrome on both sides 07/28/2018   Lumbar herniated disc 10/01/2015   Herniation of lumbar intervertebral disc with radiculopathy 09/30/2015    Class: Acute   Past Medical History:  Diagnosis Date   Arthritis    knees and back   Asthma    childhood   Carpal tunnel syndrome    Cataract    Diverticulitis    GERD (gastroesophageal reflux disease)    Hypercholesterolemia    Memory loss    Stroke ([place]) 07/31/2020    Family History  Problem Relation Age of Onset   Parkinson's disease Mother    Heart failure Father    Diabetes Brother  Cancer Brother        throat   Hydrocephalus Brother    Diabetes Brother    Migraines Sister     Past Surgical History:  Procedure Laterality Date   BILATERAL CARPAL TUNNEL RELEASE     CATARACT EXTRACTION, BILATERAL Bilateral    COLONOSCOPY WITH PROPOFOL N/A 10/29/2014   Procedure: COLONOSCOPY WITH PROPOFOL;  Surgeon: Garlan Fair, MD;  [facility];  Service: Endoscopy;  Laterality: N/A;   EYE SURGERY     LUMBAR LAMINECTOMY/DECOMPRESSION MICRODISCECTOMY N/A 10/01/2015   Procedure: LEFT L2-3 MICRODISCECTOMY;  Surgeon: Jessy Oto, MD;  Location: [facility];  Service: Orthopedics;  Laterality: N/A;   Social History   Occupational History   Not  on file  Tobacco Use   Smoking status: Former    Pack years: 0.00    Types: Cigars    Quit date: 10/22/2010    Years since quitting: 10.2   Smokeless tobacco: Never  Vaping Use   Vaping Use: Never used  Substance and Sexual Activity   Alcohol use: No    Alcohol/week: 0.0 standard drinks   Drug use: No   Sexual activity: Never    Birth control/protection: Abstinence

## 2021-01-09 ENCOUNTER — Encounter: Payer: Self-pay | Admitting: Orthopaedic Surgery

## 2021-01-09 ENCOUNTER — Other Ambulatory Visit: Payer: Self-pay

## 2021-01-09 ENCOUNTER — Ambulatory Visit: Payer: Medicare HMO | Admitting: Orthopaedic Surgery

## 2021-01-09 VITALS — Ht 70.0 in | Wt 223.0 lb

## 2021-01-09 DIAGNOSIS — M170 Bilateral primary osteoarthritis of knee: Secondary | ICD-10-CM | POA: Diagnosis not present

## 2021-01-09 MED ORDER — HYALURONAN 30 MG/2ML IX SOSY
30.0000 mg | PREFILLED_SYRINGE | INTRA_ARTICULAR | Status: AC | PRN
  Administered 2021-01-09: 30 mg via INTRA_ARTICULAR

## 2021-01-09 NOTE — Progress Notes (Signed)
Office Visit Note   Patient: Kenneth Harper           Date of Birth: Aug 10, 1940           MRN: [id number] Visit Date: 01/09/2021              Requested by: Wenda Low, MD [address] [place],  [place] [address] PCP: Wenda Low, MD   Assessment & Plan: Visit Diagnoses:  1. Bilateral primary osteoarthritis of knee     Plan: Second Orthovisc injection both knees.  Did well with the first.  Had vasovagal reaction after the first injection but did well today Follow-Up Instructions: Return in about 1 week (around 01/16/2021).   Orders:  Orders Placed This Encounter  Procedures   Large Joint Inj: bilateral knee   No orders of the defined types were placed in this encounter.     Procedures: Large Joint Inj: bilateral knee on 01/09/2021 1:34 PM Indications: pain and joint swelling Details: 25 G 1.5 in needle, anteromedial approach  Arthrogram: No  Medications (Right): 30 mg Hyaluronan 30 MG/2ML Medications (Left): 30 mg Hyaluronan 30 MG/2ML Outcome: tolerated well, no immediate complications Procedure, treatment alternatives, risks and benefits explained, specific risks discussed. Consent was given by the patient. Immediately prior to procedure a time out was called to verify the correct patient, procedure, equipment, support staff and site/side marked as required. Patient was prepped and draped in the usual sterile fashion.      Clinical Data: No additional findings.   Subjective: Chief Complaint  Patient presents with   Right Knee - Follow-up   Left Knee - Follow-up  Patient presents today for the second orthovisc injections bilaterally. He started the series on 01/02/2021. No complications from the first injections and wishes to continue.  HPI  Review of Systems   Objective: Vital Signs: Ht 5\' 10"  (1.778 m)   Wt 223 lb (101.2 kg)   BMI 32.00 kg/m   Physical Exam  Ortho Exam both knees were not hot red warm or swollen and without  effusion.  I proceeded with the Orthovisc injections  Specialty Comments:  No specialty comments available.  Imaging: No results found.   PMFS History: Patient Active Problem List   Diagnosis Date Noted   Essential hypertension 08/13/2020   Transaminitis    Steroid-induced hyperglycemia    Prediabetes    Contusion of right knee    Thalamic hemorrhage with stroke ([place]) 08/06/2020   Hemorrhagic stroke (HCC)    Memory loss    Dyslipidemia    Elevated blood pressure reading    AKI (acute kidney injury) ([place])    Primary osteoarthritis of right knee    ICH (intracerebral hemorrhage) ([place]) 07/31/2020   Bilateral primary osteoarthritis of knee 08/10/2019   Carpal tunnel syndrome on both sides 07/28/2018   Lumbar herniated disc 10/01/2015   Herniation of lumbar intervertebral disc with radiculopathy 09/30/2015    Class: Acute   Past Medical History:  Diagnosis Date   Arthritis    knees and back   Asthma    childhood   Carpal tunnel syndrome    Cataract    Diverticulitis    GERD (gastroesophageal reflux disease)    Hypercholesterolemia    Memory loss    Stroke ([place]) 07/31/2020    Family History  Problem Relation Age of Onset   Parkinson's disease Mother    Heart failure Father    Diabetes Brother    Cancer Brother  throat   Hydrocephalus Brother    Diabetes Brother    Migraines Sister     Past Surgical History:  Procedure Laterality Date   BILATERAL CARPAL TUNNEL RELEASE     CATARACT EXTRACTION, BILATERAL Bilateral    COLONOSCOPY WITH PROPOFOL N/A 10/29/2014   Procedure: COLONOSCOPY WITH PROPOFOL;  Surgeon: Garlan Fair, MD;  [facility];  Service: Endoscopy;  Laterality: N/A;   EYE SURGERY     LUMBAR LAMINECTOMY/DECOMPRESSION MICRODISCECTOMY N/A 10/01/2015   Procedure: LEFT L2-3 MICRODISCECTOMY;  Surgeon: Jessy Oto, MD;  Location: [facility];  Service: Orthopedics;  Laterality: N/A;   Social History   Occupational History   Not on file   Tobacco Use   Smoking status: Former    Pack years: 0.00    Types: Cigars    Quit date: 10/22/2010    Years since quitting: 10.2   Smokeless tobacco: Never  Vaping Use   Vaping Use: Never used  Substance and Sexual Activity   Alcohol use: No    Alcohol/week: 0.0 standard drinks   Drug use: No   Sexual activity: Never    Birth control/protection: Abstinence

## 2021-01-16 ENCOUNTER — Other Ambulatory Visit: Payer: Self-pay

## 2021-01-16 ENCOUNTER — Ambulatory Visit: Payer: Medicare HMO | Admitting: Orthopaedic Surgery

## 2021-01-16 ENCOUNTER — Encounter: Payer: Self-pay | Admitting: Orthopaedic Surgery

## 2021-01-16 DIAGNOSIS — M170 Bilateral primary osteoarthritis of knee: Secondary | ICD-10-CM

## 2021-01-16 MED ORDER — HYALURONAN 30 MG/2ML IX SOSY
30.0000 mg | PREFILLED_SYRINGE | INTRA_ARTICULAR | Status: AC | PRN
  Administered 2021-01-16: 30 mg via INTRA_ARTICULAR

## 2021-01-16 NOTE — Progress Notes (Signed)
Office Visit Note   Patient: Kenneth Harper           Date of Birth: 04-05-1941           MRN: [id number] Visit Date: 01/16/2021              Requested by: Wenda Low, MD [place] [address] [place],  [place] [address] PCP: Wenda Low, MD   Assessment & Plan: Visit Diagnoses:  1. Bilateral primary osteoarthritis of knee     Plan: Third and final Orthovisc injection both knees.  Has done well with the prior 2 and feels like his knees are "much better.  We will plan to see him back as needed  Follow-Up Instructions: Return if symptoms worsen or fail to improve.   Orders:  No orders of the defined types were placed in this encounter.  No orders of the defined types were placed in this encounter.     Procedures: Large Joint Inj: bilateral knee on 01/16/2021 4:13 PM Indications: pain and joint swelling Details: 25 G 1.5 in needle, anteromedial approach  Arthrogram: No  Medications (Right): 30 mg Hyaluronan 30 MG/2ML Medications (Left): 30 mg Hyaluronan 30 MG/2ML Outcome: tolerated well, no immediate complications Procedure, treatment alternatives, risks and benefits explained, specific risks discussed. Consent was given by the patient. Immediately prior to procedure a time out was called to verify the correct patient, procedure, equipment, support staff and site/side marked as required. Patient was prepped and draped in the usual sterile fashion.      Clinical Data: No additional findings.   Subjective: Chief Complaint  Patient presents with   Right Knee - Pain   Left Knee - Pain  Third Orthovisc injection both knees.  No problems with the prior to and feels much better  HPI  Review of Systems   Objective: Vital Signs: There were no vitals taken for this visit.  Physical Exam  Ortho Exam neither knee was hot warm or swollen.  No effusion.  No significant tenderness.  Walks without a limp or ambulatory aid  Specialty Comments:  No specialty  comments available.  Imaging: No results found.   PMFS History: Patient Active Problem List   Diagnosis Date Noted   Essential hypertension 08/13/2020   Transaminitis    Steroid-induced hyperglycemia    Prediabetes    Contusion of right knee    Thalamic hemorrhage with stroke ([place]) 08/06/2020   Hemorrhagic stroke ([facility])    Memory loss    Dyslipidemia    Elevated blood pressure reading    AKI (acute kidney injury) ([place])    Primary osteoarthritis of right knee    ICH (intracerebral hemorrhage) ([place]) 07/31/2020   Bilateral primary osteoarthritis of knee 08/10/2019   Carpal tunnel syndrome on both sides 07/28/2018   Lumbar herniated disc 10/01/2015   Herniation of lumbar intervertebral disc with radiculopathy 09/30/2015    Class: Acute   Past Medical History:  Diagnosis Date   Arthritis    knees and back   Asthma    childhood   Carpal tunnel syndrome    Cataract    Diverticulitis    GERD (gastroesophageal reflux disease)    Hypercholesterolemia    Memory loss    Stroke ([place]) 07/31/2020    Family History  Problem Relation Age of Onset   Parkinson's disease Mother    Heart failure Father    Diabetes Brother    Cancer Brother        throat  Hydrocephalus Brother    Diabetes Brother    Migraines Sister     Past Surgical History:  Procedure Laterality Date   BILATERAL CARPAL TUNNEL RELEASE     CATARACT EXTRACTION, BILATERAL Bilateral    COLONOSCOPY WITH PROPOFOL N/A 10/29/2014   Procedure: COLONOSCOPY WITH PROPOFOL;  Surgeon: Garlan Fair, MD;  [facility];  Service: Endoscopy;  Laterality: N/A;   EYE SURGERY     LUMBAR LAMINECTOMY/DECOMPRESSION MICRODISCECTOMY N/A 10/01/2015   Procedure: LEFT L2-3 MICRODISCECTOMY;  Surgeon: Jessy Oto, MD;  Location: [facility];  Service: Orthopedics;  Laterality: N/A;   Social History   Occupational History   Not on file  Tobacco Use   Smoking status: Former    Pack years: 0.00    Types: Cigars    Quit date:  10/22/2010    Years since quitting: 10.2   Smokeless tobacco: Never  Vaping Use   Vaping Use: Never used  Substance and Sexual Activity   Alcohol use: No    Alcohol/week: 0.0 standard drinks   Drug use: No   Sexual activity: Never    Birth control/protection: Abstinence     [name], MD   Note - This record has been created using Editor, commissioning.  Chart creation errors have been sought, but may not always  have been located. Such creation errors do not reflect on  the standard of medical care.

## 2021-02-13 DIAGNOSIS — F341 Dysthymic disorder: Secondary | ICD-10-CM | POA: Diagnosis not present

## 2021-02-13 DIAGNOSIS — N1831 Chronic kidney disease, stage 3a: Secondary | ICD-10-CM | POA: Diagnosis not present

## 2021-02-13 DIAGNOSIS — M1990 Unspecified osteoarthritis, unspecified site: Secondary | ICD-10-CM | POA: Diagnosis not present

## 2021-02-13 DIAGNOSIS — R7303 Prediabetes: Secondary | ICD-10-CM | POA: Diagnosis not present

## 2021-02-13 DIAGNOSIS — I10 Essential (primary) hypertension: Secondary | ICD-10-CM | POA: Diagnosis not present

## 2021-02-13 DIAGNOSIS — Z0000 Encounter for general adult medical examination without abnormal findings: Secondary | ICD-10-CM | POA: Diagnosis not present

## 2021-02-13 DIAGNOSIS — I619 Nontraumatic intracerebral hemorrhage, unspecified: Secondary | ICD-10-CM | POA: Diagnosis not present

## 2021-02-13 DIAGNOSIS — Z1389 Encounter for screening for other disorder: Secondary | ICD-10-CM | POA: Diagnosis not present

## 2021-02-13 DIAGNOSIS — N400 Enlarged prostate without lower urinary tract symptoms: Secondary | ICD-10-CM | POA: Diagnosis not present

## 2021-02-13 DIAGNOSIS — E7800 Pure hypercholesterolemia, unspecified: Secondary | ICD-10-CM | POA: Diagnosis not present

## 2021-03-26 DIAGNOSIS — R5383 Other fatigue: Secondary | ICD-10-CM | POA: Diagnosis not present

## 2021-03-26 DIAGNOSIS — Z8673 Personal history of transient ischemic attack (TIA), and cerebral infarction without residual deficits: Secondary | ICD-10-CM | POA: Diagnosis not present

## 2021-03-26 DIAGNOSIS — E7800 Pure hypercholesterolemia, unspecified: Secondary | ICD-10-CM | POA: Diagnosis not present

## 2021-03-26 DIAGNOSIS — I10 Essential (primary) hypertension: Secondary | ICD-10-CM | POA: Diagnosis not present

## 2021-03-26 DIAGNOSIS — N1831 Chronic kidney disease, stage 3a: Secondary | ICD-10-CM | POA: Diagnosis not present

## 2021-03-26 DIAGNOSIS — F341 Dysthymic disorder: Secondary | ICD-10-CM | POA: Diagnosis not present

## 2021-03-26 DIAGNOSIS — Z23 Encounter for immunization: Secondary | ICD-10-CM | POA: Diagnosis not present

## 2021-03-26 DIAGNOSIS — R7303 Prediabetes: Secondary | ICD-10-CM | POA: Diagnosis not present

## 2021-05-13 ENCOUNTER — Encounter: Payer: Self-pay | Admitting: Orthopaedic Surgery

## 2021-05-13 ENCOUNTER — Ambulatory Visit: Payer: Medicare HMO | Admitting: Orthopaedic Surgery

## 2021-05-13 ENCOUNTER — Other Ambulatory Visit: Payer: Self-pay

## 2021-05-13 DIAGNOSIS — M25562 Pain in left knee: Secondary | ICD-10-CM | POA: Diagnosis not present

## 2021-05-13 DIAGNOSIS — M170 Bilateral primary osteoarthritis of knee: Secondary | ICD-10-CM

## 2021-05-13 DIAGNOSIS — M25561 Pain in right knee: Secondary | ICD-10-CM

## 2021-05-13 MED ORDER — BUPIVACAINE HCL 0.25 % IJ SOLN
2.0000 mL | INTRAMUSCULAR | Status: AC | PRN
  Administered 2021-05-13: 2 mL via INTRA_ARTICULAR

## 2021-05-13 MED ORDER — LIDOCAINE HCL 1 % IJ SOLN
2.0000 mL | INTRAMUSCULAR | Status: AC | PRN
  Administered 2021-05-13: 2 mL

## 2021-05-13 MED ORDER — METHYLPREDNISOLONE ACETATE 40 MG/ML IJ SUSP
80.0000 mg | INTRAMUSCULAR | Status: AC | PRN
Start: 2021-05-13 — End: 2021-05-13
  Administered 2021-05-13: 80 mg via INTRA_ARTICULAR

## 2021-05-13 NOTE — Progress Notes (Signed)
Office Visit Note   Patient: Kenneth Harper           Date of Birth: 05-May-1941           MRN: [id number] Visit Date: 05/13/2021              Requested by: Wenda Low, MD [facility] [address] [place],  [place] [address] PCP: Wenda Low, MD   Assessment & Plan: Visit Diagnoses:  1. Bilateral primary osteoarthritis of knee     Plan: Mr. Gearheart has been seen in the past for evaluation of bilateral knee pain consistent with osteoarthritis.  Plain films.  He has had a course of Orthovisc with some relief but temporary.  He has had cortisone in the past.  We have had a long discussion regarding his arthritis and different treatment options including total knee replacement.  He is not ready for that yet but would like cortisone today  Follow-Up Instructions: Return if symptoms worsen or fail to improve.   Orders:  No orders of the defined types were placed in this encounter.  No orders of the defined types were placed in this encounter.     Procedures: Large Joint Inj: bilateral knee on 05/13/2021 4:13 PM Indications: diagnostic evaluation Details: 25 G 1.5 in needle, anteromedial approach  Arthrogram: No  Medications (Right): 2 mL lidocaine 1 %; 80 mg methylPREDNISolone acetate 40 MG/ML; 2 mL bupivacaine 0.25 % Medications (Left): 2 mL lidocaine 1 %; 80 mg methylPREDNISolone acetate 40 MG/ML; 2 mL bupivacaine 0.25 % Consent was given by the patient. Immediately prior to procedure a time out was called to verify the correct patient, procedure, equipment, support staff and site/side marked as required. Patient was prepped and draped in the usual sterile fashion.      Clinical Data: No additional findings.   Subjective: Chief Complaint  Patient presents with   Right Knee - Pain   Left Knee - Pain  Patient presents today for bilateral chronic knee pain. He finished orthovisc in July of this year. He states that the orthovisc helped for awhile. He is wanting to  talk about his options for treatment at this point. Both knees hurt equally the same. He is not taking anything for pain.   HPI  Review of Systems   Objective: Vital Signs: There were no vitals taken for this visit.  Physical Exam Constitutional:      Appearance: He is well-developed.  Pulmonary:     Effort: Pulmonary effort is normal.  Skin:    General: Skin is warm and dry.  Neurological:     Mental Status: He is alert and oriented to person, place, and time.  Psychiatric:        Behavior: Behavior normal.    Ortho Exam small effusion both knees.  Mostly medial joint pain.  Full extension of flexed about 95 200 degrees without instability.  Specialty Comments:  No specialty comments available.  Imaging: No results found.   PMFS History: Patient Active Problem List   Diagnosis Date Noted   Essential hypertension 08/13/2020   Transaminitis    Steroid-induced hyperglycemia    Prediabetes    Contusion of right knee    Thalamic hemorrhage with stroke ([facility]) 08/06/2020   Hemorrhagic stroke (HCC)    Memory loss    Dyslipidemia    Elevated blood pressure reading    AKI (acute kidney injury) ([facility])    Primary osteoarthritis of right knee    ICH (intracerebral hemorrhage) ([facility])  07/31/2020   Bilateral primary osteoarthritis of knee 08/10/2019   Carpal tunnel syndrome on both sides 07/28/2018   Lumbar herniated disc 10/01/2015   Herniation of lumbar intervertebral disc with radiculopathy 09/30/2015    Class: Acute   Past Medical History:  Diagnosis Date   Arthritis    knees and back   Asthma    childhood   Carpal tunnel syndrome    Cataract    Diverticulitis    GERD (gastroesophageal reflux disease)    Hypercholesterolemia    Memory loss    Stroke ([facility]) 07/31/2020    Family History  Problem Relation Age of Onset   Parkinson's disease Mother    Heart failure Father    Diabetes Brother    Cancer Brother        throat   Hydrocephalus Brother    Diabetes  Brother    Migraines Sister     Past Surgical History:  Procedure Laterality Date   BILATERAL CARPAL TUNNEL RELEASE     CATARACT EXTRACTION, BILATERAL Bilateral    COLONOSCOPY WITH PROPOFOL N/A 10/29/2014   Procedure: COLONOSCOPY WITH PROPOFOL;  Surgeon: Garlan Fair, MD;  [facility];  Service: Endoscopy;  Laterality: N/A;   EYE SURGERY     LUMBAR LAMINECTOMY/DECOMPRESSION MICRODISCECTOMY N/A 10/01/2015   Procedure: LEFT L2-3 MICRODISCECTOMY;  Surgeon: Jessy Oto, MD;  Location: [facility];  Service: Orthopedics;  Laterality: N/A;   Social History   Occupational History   Not on file  Tobacco Use   Smoking status: Former    Types: Cigars    Quit date: 10/22/2010    Years since quitting: 10.5   Smokeless tobacco: Never  Vaping Use   Vaping Use: Never used  Substance and Sexual Activity   Alcohol use: No    Alcohol/week: 0.0 standard drinks   Drug use: No   Sexual activity: Never    Birth control/protection: Abstinence

## 2021-07-02 ENCOUNTER — Ambulatory Visit: Payer: Medicare HMO | Admitting: Orthopaedic Surgery

## 2021-07-03 ENCOUNTER — Encounter: Payer: Self-pay | Admitting: Orthopaedic Surgery

## 2021-07-03 ENCOUNTER — Ambulatory Visit (INDEPENDENT_AMBULATORY_CARE_PROVIDER_SITE_OTHER): Payer: Medicare HMO | Admitting: Orthopaedic Surgery

## 2021-07-03 ENCOUNTER — Other Ambulatory Visit: Payer: Self-pay

## 2021-07-03 DIAGNOSIS — M1711 Unilateral primary osteoarthritis, right knee: Secondary | ICD-10-CM

## 2021-07-03 DIAGNOSIS — M170 Bilateral primary osteoarthritis of knee: Secondary | ICD-10-CM

## 2021-07-03 MED ORDER — LIDOCAINE HCL 1 % IJ SOLN
2.0000 mL | INTRAMUSCULAR | Status: AC | PRN
Start: 2021-07-03 — End: 2021-07-03
  Administered 2021-07-03: 16:00:00 2 mL

## 2021-07-03 MED ORDER — BUPIVACAINE HCL 0.25 % IJ SOLN
2.0000 mL | INTRAMUSCULAR | Status: AC | PRN
  Administered 2021-07-03: 16:00:00 2 mL via INTRA_ARTICULAR

## 2021-07-03 NOTE — Progress Notes (Signed)
Office Visit Note   Patient: Kenneth Harper           Date of Birth: 10/19/1940           MRN: [id number] Visit Date: 07/03/2021              Requested by: Wenda Low, MD [address] [place],  [place] [address] PCP: Wenda Low, MD   Assessment & Plan: Visit Diagnoses:  1. Bilateral primary osteoarthritis of knee   2. Primary osteoarthritis of right knee     Plan: Kenneth Harper has advanced osteoarthritis of both of his knees.  In the past has had cortisone injections and viscosupplementation with only temporary relief.  He was last injected with cortisone of both knees approximately 2 months ago and notes he is having recurrent symptoms.  Long discussion regarding his knees and the consideration of the knee replacement.  I do not like reinjecting his knees on a frequent basis.  We will reinject today as he is uncomfortable and would like to enjoy the holidays.  Plan to see him back as needed.  Follow-Up Instructions: Return if symptoms worsen or fail to improve.   Orders:  No orders of the defined types were placed in this encounter.  No orders of the defined types were placed in this encounter.     Procedures: Large Joint Inj: bilateral knee on 07/03/2021 3:39 PM Indications: diagnostic evaluation Details: 25 G 1.5 in needle, anteromedial approach  Arthrogram: No  Medications (Right): 2 mL lidocaine 1 %; 2 mL bupivacaine 0.25 % Medications (Left): 2 mL lidocaine 1 %; 2 mL bupivacaine 0.25 %  12 mg betamethasone injected with Marcaine and Xylocaine into both knees along the medial compartment without problem Consent was given by the patient. Immediately prior to procedure a time out was called to verify the correct patient, procedure, equipment, support staff and site/side marked as required. Patient was prepped and draped in the usual sterile fashion.      Clinical Data: No additional findings.   Subjective: Chief Complaint  Patient presents  with   Right Knee - Pain   Left Knee - Pain  Kenneth Harper has advanced, end-stage osteoarthritis of both knees predominantly in the medial compartments.  He has had cortisone in the past as well as viscosupplementation with only temporary relief of his discomfort.  He like to have cortisone in his knees today so that he can enjoy the holidays.  He is aware that definitive treatment would be knee replacement surgery  HPI  Review of Systems   Objective: Vital Signs: There were no vitals taken for this visit.  Physical Exam Constitutional:      Appearance: He is well-developed.  Eyes:     Pupils: Pupils are equal, round, and reactive to light.  Pulmonary:     Effort: Pulmonary effort is normal.  Skin:    General: Skin is warm and dry.  Neurological:     Mental Status: He is alert and oriented to person, place, and time.  Psychiatric:        Behavior: Behavior normal.    Ortho Exam lacked a few degrees to full extension of both of his knees and over 100 degrees of flexion without instability.  Might have very small effusions of both knees with predominantly medial joint pain.  There is some patella crepitation but no particular pain with patella compression.  No popliteal pain or mass.  Increased varus bilaterally  Specialty Comments:  No specialty comments available.  Imaging: No results found.   PMFS History: Patient Active Problem List   Diagnosis Date Noted   Essential hypertension 08/13/2020   Transaminitis    Steroid-induced hyperglycemia    Prediabetes    Contusion of right knee    Thalamic hemorrhage with stroke ([facility]) 08/06/2020   Hemorrhagic stroke (HCC)    Memory loss    Dyslipidemia    Elevated blood pressure reading    AKI (acute kidney injury) ([facility])    Primary osteoarthritis of right knee    ICH (intracerebral hemorrhage) ([facility]) 07/31/2020   Bilateral primary osteoarthritis of knee 08/10/2019   Carpal tunnel syndrome on both sides 07/28/2018   Lumbar  herniated disc 10/01/2015   Herniation of lumbar intervertebral disc with radiculopathy 09/30/2015    Class: Acute   Past Medical History:  Diagnosis Date   Arthritis    knees and back   Asthma    childhood   Carpal tunnel syndrome    Cataract    Diverticulitis    GERD (gastroesophageal reflux disease)    Hypercholesterolemia    Memory loss    Stroke ([facility]) 07/31/2020    Family History  Problem Relation Age of Onset   Parkinson's disease Mother    Heart failure Father    Diabetes Brother    Cancer Brother        throat   Hydrocephalus Brother    Diabetes Brother    Migraines Sister     Past Surgical History:  Procedure Laterality Date   BILATERAL CARPAL TUNNEL RELEASE     CATARACT EXTRACTION, BILATERAL Bilateral    COLONOSCOPY WITH PROPOFOL N/A 10/29/2014   Procedure: COLONOSCOPY WITH PROPOFOL;  Surgeon: Garlan Fair, MD;  [facility];  Service: Endoscopy;  Laterality: N/A;   EYE SURGERY     LUMBAR LAMINECTOMY/DECOMPRESSION MICRODISCECTOMY N/A 10/01/2015   Procedure: LEFT L2-3 MICRODISCECTOMY;  Surgeon: Jessy Oto, MD;  Location: [facility];  Service: Orthopedics;  Laterality: N/A;   Social History   Occupational History   Not on file  Tobacco Use   Smoking status: Former    Types: Cigars    Quit date: 10/22/2010    Years since quitting: 10.7   Smokeless tobacco: Never  Vaping Use   Vaping Use: Never used  Substance and Sexual Activity   Alcohol use: No    Alcohol/week: 0.0 standard drinks   Drug use: No   Sexual activity: Never    Birth control/protection: Abstinence     [name], MD   Note - This record has been created using Editor, commissioning.  Chart creation errors have been sought, but may not always  have been located. Such creation errors do not reflect on  the standard of medical care.

## 2021-07-28 IMAGING — DX DG KNEE 1-2V*R*
2 series · 2 of 2 positions shown · non-contrast
Comparison: 06/21/2018.

CLINICAL DATA: 79-year-old male with history of trauma from a fall
5 days ago complaining of right anterior knee pain.

EXAM:
RIGHT KNEE - 1-2 VIEW

[knee ap]
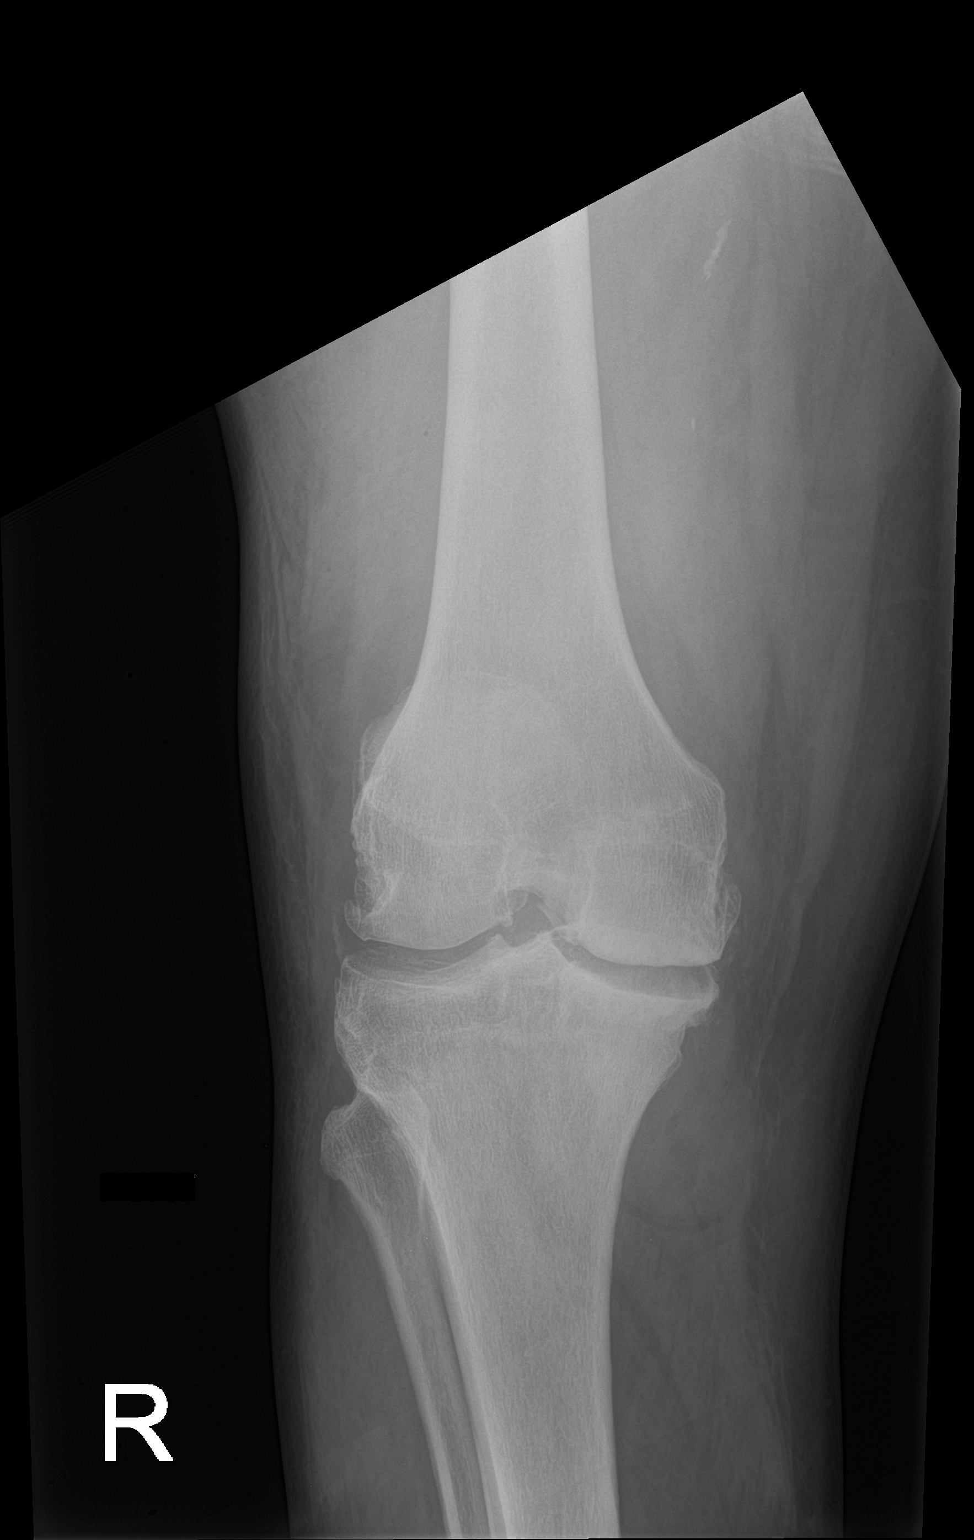

[knee lat]
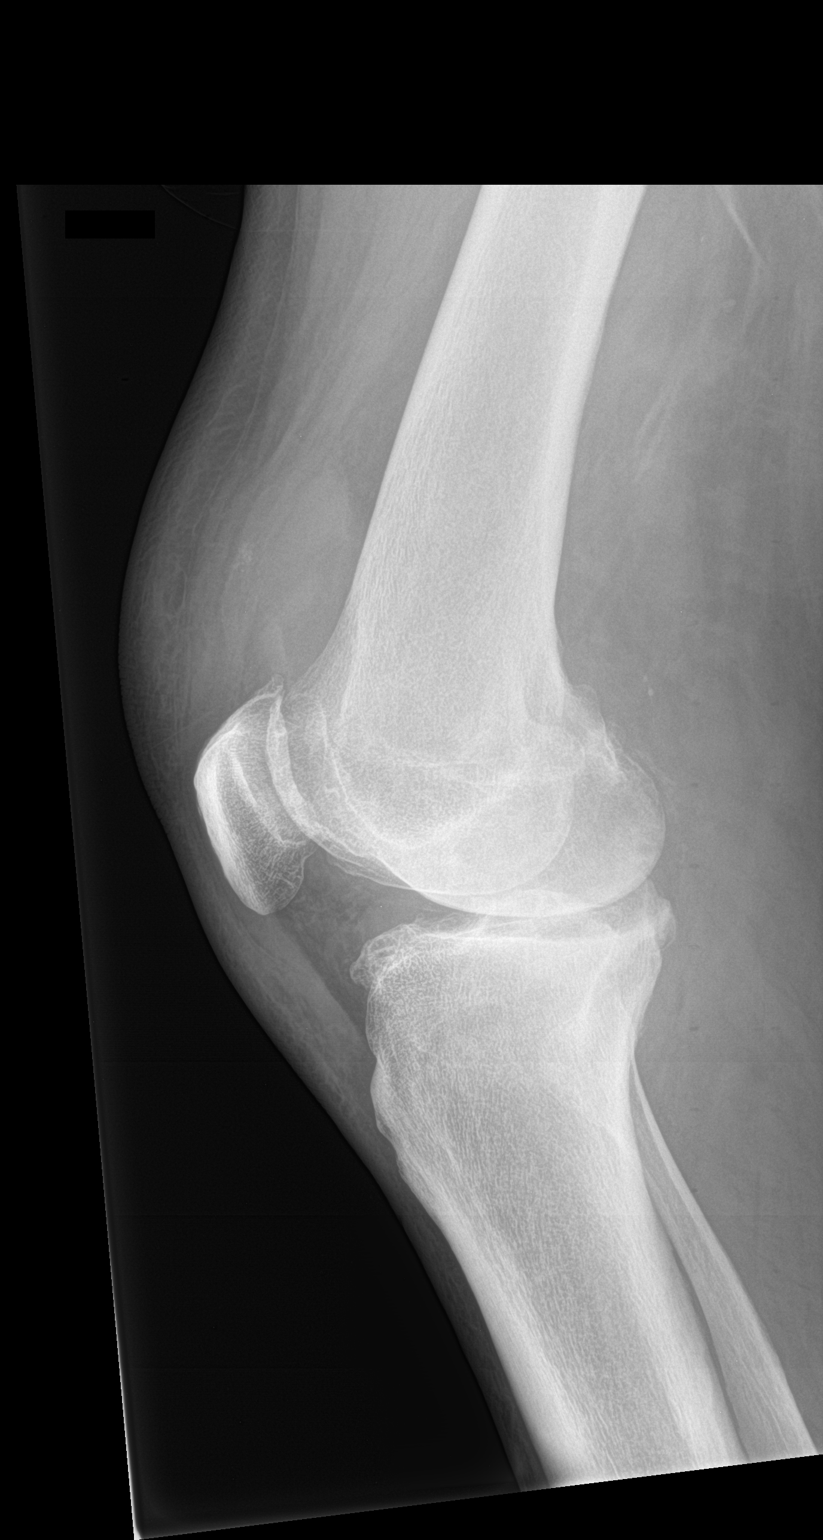

[2 of 2 positions shown; findings below may reference images not displayed]

FINDINGS: No definite acute displaced fracture or dislocation. Severe joint
space narrowing, subchondral sclerosis, subchondral cyst formation
and osteophyte formation is again noted, most pronounced in the
medial and patellofemoral compartments. Large suprapatellar joint
effusion. Vascular calcifications noted in the medial aspect of the
right thigh.
IMPRESSION: 1. No acute displaced fracture or dislocation.
2. Large suprapatellar joint effusion.
3. Severe tricompartmental osteoarthritis, most pronounced in the
medial and patellofemoral compartments.

## 2021-07-29 IMAGING — DX DG CHEST 1V PORT
1 series · 1 of 1 positions shown · non-contrast
Comparison: 04/05/2018

CLINICAL DATA: Headache and fever

EXAM:
PORTABLE CHEST 1 VIEW

[chest]
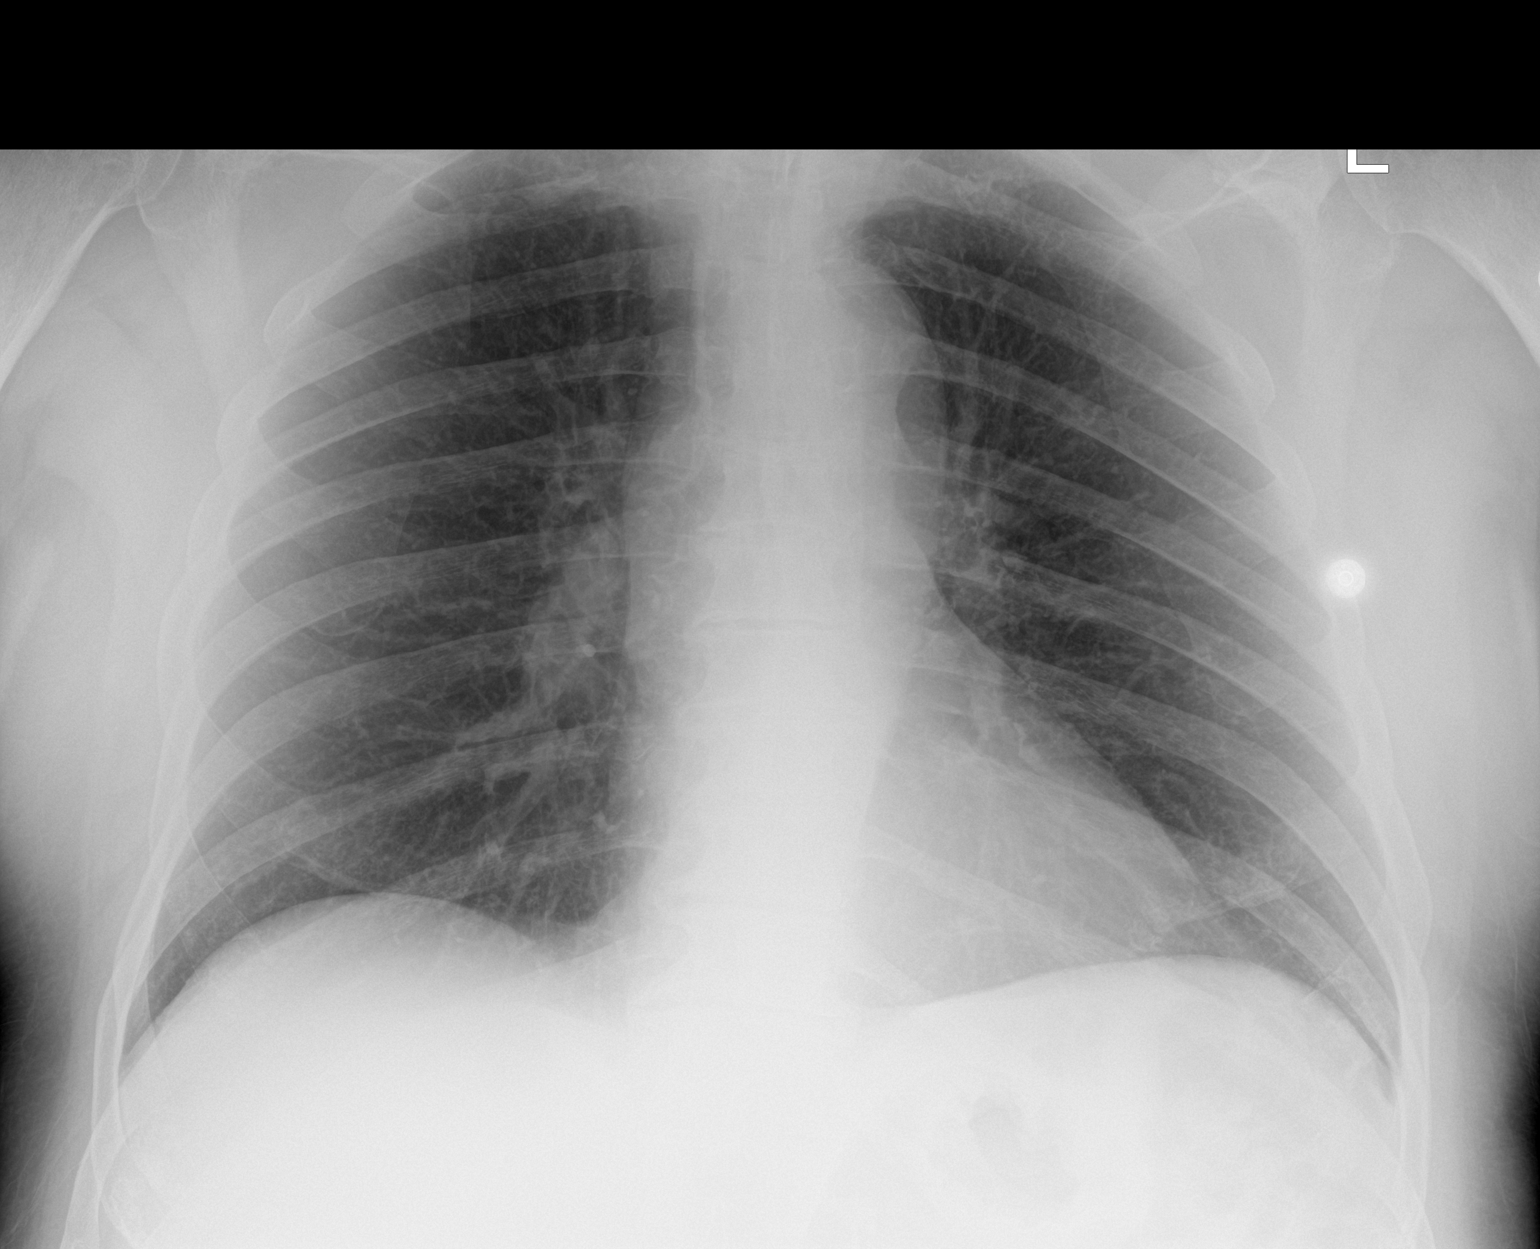

[1 of 1 positions shown; findings below may reference images not displayed]

FINDINGS: The heart size and mediastinal contours are within normal limits.
Both lungs are clear. The visualized skeletal structures are
unremarkable.
IMPRESSION: No active disease.

## 2021-09-25 ENCOUNTER — Ambulatory Visit: Payer: Medicare HMO | Admitting: Orthopaedic Surgery

## 2021-09-25 ENCOUNTER — Other Ambulatory Visit: Payer: Self-pay

## 2021-09-25 ENCOUNTER — Encounter: Payer: Self-pay | Admitting: Orthopaedic Surgery

## 2021-09-25 DIAGNOSIS — M170 Bilateral primary osteoarthritis of knee: Secondary | ICD-10-CM

## 2021-09-25 MED ORDER — LIDOCAINE HCL 1 % IJ SOLN
2.0000 mL | INTRAMUSCULAR | Status: AC | PRN
  Administered 2021-09-25: 2 mL

## 2021-09-25 MED ORDER — BUPIVACAINE HCL 0.25 % IJ SOLN
2.0000 mL | INTRAMUSCULAR | Status: AC | PRN
  Administered 2021-09-25: 2 mL via INTRA_ARTICULAR

## 2021-09-25 MED ORDER — METHYLPREDNISOLONE ACETATE 40 MG/ML IJ SUSP
80.0000 mg | INTRAMUSCULAR | Status: AC | PRN
  Administered 2021-09-25: 80 mg via INTRA_ARTICULAR

## 2021-09-25 NOTE — Progress Notes (Signed)
? ?Office Visit Note ?  ?Patient: Kenneth Harper           ?Date of Birth: 12-30-1940           ?MRN: [id number] ?Visit Date: 09/25/2021 ?             ?Requested by: Wenda Low, MD ?[address] ?[address] ?[place],  [place] [address] ?PCP: Wenda Low, MD ? ? ?Assessment & Plan: ?Visit Diagnoses:  ?1. Bilateral primary osteoarthritis of knee   ? ? ?Plan: Kenneth Harper has advanced osteoarthritis of both knees.  In the past he has responded nicely to intra-articular cortisone injections.  Spent about 3 months since his last injections and he like to have both knees injected today.  This was performed without difficulty and we will plan to see him back as needed.  We have discussed knee replacement but he is really not interested at this point ? ?Follow-Up Instructions: Return if symptoms worsen or fail to improve.  ? ?Orders:  ?No orders of the defined types were placed in this encounter. ? ?No orders of the defined types were placed in this encounter. ? ? ? ? Procedures: ?Large Joint Inj: bilateral knee on 09/25/2021 2:46 PM ?Indications: diagnostic evaluation ?Details: 25 G 1.5 in needle, anteromedial approach ? ?Arthrogram: No ? ?Medications (Right): 2 mL lidocaine 1 %; 80 mg methylPREDNISolone acetate 40 MG/ML; 2 mL bupivacaine 0.25 % ?Medications (Left): 2 mL lidocaine 1 %; 80 mg methylPREDNISolone acetate 40 MG/ML; 2 mL bupivacaine 0.25 % ?Consent was given by the patient. Immediately prior to procedure a time out was called to verify the correct patient, procedure, equipment, support staff and site/side marked as required. Patient was prepped and draped in the usual sterile fashion.  ? ? ? ? ?Clinical Data: ?No additional findings. ? ? ?Subjective: ?Chief Complaint  ?Patient presents with  ? Right Knee - Pain  ? Left Knee - Pain  ?Patient presents today for his chronic bilateral knee pain. He received cortisone injections last on 07/03/2021. He is wanting to get both knees injected again today.   ? ?HPI ? ?Review of Systems ? ? ?Objective: ?Vital Signs: There were no vitals taken for this visit. ? ?Physical Exam ?Constitutional:   ?   Appearance: He is well-developed.  ?Pulmonary:  ?   Effort: Pulmonary effort is normal.  ?Skin: ?   General: Skin is warm and dry.  ?Neurological:  ?   Mental Status: He is alert and oriented to person, place, and time.  ?Psychiatric:     ?   Behavior: Behavior normal.  ? ? ?Ortho Exam knees are hypertrophic.  Mostly medial joint pain pop.  May be a very small effusion bilaterally.  He had full extension flexed over 100 degrees without instability. ? ?Specialty Comments:  ?No specialty comments available. ? ?Imaging: ?No results found. ? ? ?PMFS History: ?Patient Active Problem List  ? Diagnosis Date Noted  ? Essential hypertension 08/13/2020  ? Transaminitis   ? Steroid-induced hyperglycemia   ? Prediabetes   ? Contusion of right knee   ? Thalamic hemorrhage with stroke ([facility]) 08/06/2020  ? Hemorrhagic stroke ([facility])   ? Memory loss   ? Dyslipidemia   ? Elevated blood pressure reading   ? AKI (acute kidney injury) ([facility])   ? Primary osteoarthritis of right knee   ? ICH (intracerebral hemorrhage) ([facility]) 07/31/2020  ? Bilateral primary osteoarthritis of knee 08/10/2019  ? Carpal tunnel syndrome on both sides 07/28/2018  ?  Lumbar herniated disc 10/01/2015  ? Herniation of lumbar intervertebral disc with radiculopathy 09/30/2015  ?  Class: Acute  ? ?Past Medical History:  ?Diagnosis Date  ? Arthritis   ? knees and back  ? Asthma   ? childhood  ? Carpal tunnel syndrome   ? Cataract   ? Diverticulitis   ? GERD (gastroesophageal reflux disease)   ? Hypercholesterolemia   ? Memory loss   ? Stroke ([facility]) 07/31/2020  ?  ?Family History  ?Problem Relation Age of Onset  ? Parkinson's disease Mother   ? Heart failure Father   ? Diabetes Brother   ? Cancer Brother   ?     throat  ? Hydrocephalus Brother   ? Diabetes Brother   ? Migraines Sister   ?  ?Past Surgical History:  ?Procedure Laterality  Date  ? BILATERAL CARPAL TUNNEL RELEASE    ? CATARACT EXTRACTION, BILATERAL Bilateral   ? COLONOSCOPY WITH PROPOFOL N/A 10/29/2014  ? Procedure: COLONOSCOPY WITH PROPOFOL;  Surgeon: Garlan Fair, MD;  [facility];  Service: Endoscopy;  Laterality: N/A;  ? EYE SURGERY    ? LUMBAR LAMINECTOMY/DECOMPRESSION MICRODISCECTOMY N/A 10/01/2015  ? Procedure: LEFT L2-3 MICRODISCECTOMY;  Surgeon: Jessy Oto, MD;  Location: [facility];  Service: Orthopedics;  Laterality: N/A;  ? ?Social History  ? ?Occupational History  ? Not on file  ?Tobacco Use  ? Smoking status: Former  ?  Types: Cigars  ?  Quit date: 10/22/2010  ?  Years since quitting: 10.9  ? Smokeless tobacco: Never  ?Vaping Use  ? Vaping Use: Never used  ?Substance and Sexual Activity  ? Alcohol use: No  ?  Alcohol/week: 0.0 standard drinks  ? Drug use: No  ? Sexual activity: Never  ?  Birth control/protection: Abstinence  ? ? ? ? ? ? ?

## 2022-01-15 ENCOUNTER — Encounter: Payer: Self-pay | Admitting: Orthopaedic Surgery

## 2022-01-15 ENCOUNTER — Ambulatory Visit (INDEPENDENT_AMBULATORY_CARE_PROVIDER_SITE_OTHER): Payer: Medicare HMO | Admitting: Orthopaedic Surgery

## 2022-01-15 DIAGNOSIS — M170 Bilateral primary osteoarthritis of knee: Secondary | ICD-10-CM

## 2022-01-15 MED ORDER — BUPIVACAINE HCL 0.25 % IJ SOLN
2.0000 mL | INTRAMUSCULAR | Status: AC | PRN
  Administered 2022-01-15: 2 mL via INTRA_ARTICULAR

## 2022-01-15 MED ORDER — METHYLPREDNISOLONE ACETATE 40 MG/ML IJ SUSP
80.0000 mg | INTRAMUSCULAR | Status: AC | PRN
  Administered 2022-01-15: 80 mg via INTRA_ARTICULAR

## 2022-01-15 MED ORDER — LIDOCAINE HCL 1 % IJ SOLN
2.0000 mL | INTRAMUSCULAR | Status: AC | PRN
  Administered 2022-01-15: 2 mL

## 2022-01-15 NOTE — Progress Notes (Signed)
Office Visit Note   Patient: Kenneth Harper           Date of Birth: [birth year]           MRN: [id number] Visit Date: 01/15/2022              Requested by: Wenda Low, MD [facility] [address] [place],  [place] [address] PCP: Wenda Low, MD   Assessment & Plan: Visit Diagnoses:  1. Bilateral primary osteoarthritis of knee     Plan: Recurrent symptoms of osteoarthritis both knees.  He has had successful cortisone injections in the past which "always work".  We will reinject both knees with Depo-Medrol today  Follow-Up Instructions: Return if symptoms worsen or fail to improve.   Orders:  No orders of the defined types were placed in this encounter.  No orders of the defined types were placed in this encounter.     Procedures: Large Joint Inj: bilateral knee on 01/15/2022 3:07 PM Indications: diagnostic evaluation Details: 25 G 1.5 in needle, anteromedial approach  Arthrogram: No  Medications (Right): 2 mL lidocaine 1 %; 80 mg methylPREDNISolone acetate 40 MG/ML; 2 mL bupivacaine 0.25 % Medications (Left): 2 mL lidocaine 1 %; 80 mg methylPREDNISolone acetate 40 MG/ML; 2 mL bupivacaine 0.25 % Consent was given by the patient. Immediately prior to procedure a time out was called to verify the correct patient, procedure, equipment, support staff and site/side marked as required. Patient was prepped and draped in the usual sterile fashion.       Clinical Data: No additional findings.   Subjective: No chief complaint on file. Kenneth Harper has a history of bilateral knee osteoarthritis.  He is aware of surgical treatment of knee replacements but would prefer to have injections.  He has had viscosupplementation in the past but finds that the cortisone injections are much more helpful.  He was last seen about 3-1/2 months ago and is having recurrent symptoms of pain but no significant swelling.  He does wear a pullover knee support on both knees  HPI  Review of  Systems   Objective: Vital Signs: There were no vitals taken for this visit.  Physical Exam Constitutional:      Appearance: He is well-developed.  Eyes:     Pupils: Pupils are equal, round, and reactive to light.  Pulmonary:     Effort: Pulmonary effort is normal.  Skin:    General: Skin is warm and dry.  Neurological:     Mental Status: He is alert and oriented to person, place, and time.  Psychiatric:        Behavior: Behavior normal.     Ortho Exam knees were not hot red warm or swollen.  Mostly medial joint pain today there was some patella crepitation but no pain with patella compression.  No popliteal pain or mass.  No calf pain.  Full extension bilaterally and flexed about 100 degrees  Specialty Comments:  No specialty comments available.  Imaging: No results found.   PMFS History: Patient Active Problem List   Diagnosis Date Noted   Essential hypertension 08/13/2020   Transaminitis    Steroid-induced hyperglycemia    Prediabetes    Contusion of right knee    Thalamic hemorrhage with stroke ([facility]) 08/06/2020   Hemorrhagic stroke (HCC)    Memory loss    Dyslipidemia    Elevated blood pressure reading    AKI (acute kidney injury) ([facility])    Primary osteoarthritis of right knee  ICH (intracerebral hemorrhage) ([facility]) 07/31/2020   Bilateral primary osteoarthritis of knee 08/10/2019   Carpal tunnel syndrome on both sides 07/28/2018   Lumbar herniated disc 10/01/2015   Herniation of lumbar intervertebral disc with radiculopathy 09/30/2015    Class: Acute   Past Medical History:  Diagnosis Date   Arthritis    knees and back   Asthma    childhood   Carpal tunnel syndrome    Cataract    Diverticulitis    GERD (gastroesophageal reflux disease)    Hypercholesterolemia    Memory loss    Stroke ([facility]) 07/31/2020    Family History  Problem Relation Age of Onset   Parkinson's disease Mother    Heart failure Father    Diabetes Brother    Cancer Brother         throat   Hydrocephalus Brother    Diabetes Brother    Migraines Sister     Past Surgical History:  Procedure Laterality Date   BILATERAL CARPAL TUNNEL RELEASE     CATARACT EXTRACTION, BILATERAL Bilateral    COLONOSCOPY WITH PROPOFOL N/A 10/29/2014   Procedure: COLONOSCOPY WITH PROPOFOL;  Surgeon: Garlan Fair, MD;  [facility];  Service: Endoscopy;  Laterality: N/A;   EYE SURGERY     LUMBAR LAMINECTOMY/DECOMPRESSION MICRODISCECTOMY N/A 10/01/2015   Procedure: LEFT L2-3 MICRODISCECTOMY;  Surgeon: Jessy Oto, MD;  Location: [facility];  Service: Orthopedics;  Laterality: N/A;   Social History   Occupational History   Not on file  Tobacco Use   Smoking status: Former    Types: Cigars    Quit date: 10/22/2010    Years since quitting: 11.2   Smokeless tobacco: Never  Vaping Use   Vaping Use: Never used  Substance and Sexual Activity   Alcohol use: No    Alcohol/week: 0.0 standard drinks of alcohol   Drug use: No   Sexual activity: Never    Birth control/protection: Abstinence     [name], MD   Note - This record has been created using Editor, commissioning.  Chart creation errors have been sought, but may not always  have been located. Such creation errors do not reflect on  the standard of medical care.

## 2022-01-26 DIAGNOSIS — H6123 Impacted cerumen, bilateral: Secondary | ICD-10-CM | POA: Diagnosis not present

## 2022-01-26 DIAGNOSIS — H61303 Acquired stenosis of external ear canal, unspecified, bilateral: Secondary | ICD-10-CM | POA: Diagnosis not present

## 2022-01-28 DIAGNOSIS — R413 Other amnesia: Secondary | ICD-10-CM | POA: Diagnosis not present

## 2022-01-28 DIAGNOSIS — R4586 Emotional lability: Secondary | ICD-10-CM | POA: Diagnosis not present

## 2022-02-16 DIAGNOSIS — E7800 Pure hypercholesterolemia, unspecified: Secondary | ICD-10-CM | POA: Diagnosis not present

## 2022-02-16 DIAGNOSIS — F341 Dysthymic disorder: Secondary | ICD-10-CM | POA: Diagnosis not present

## 2022-02-16 DIAGNOSIS — R7303 Prediabetes: Secondary | ICD-10-CM | POA: Diagnosis not present

## 2022-02-16 DIAGNOSIS — N400 Enlarged prostate without lower urinary tract symptoms: Secondary | ICD-10-CM | POA: Diagnosis not present

## 2022-02-16 DIAGNOSIS — I10 Essential (primary) hypertension: Secondary | ICD-10-CM | POA: Diagnosis not present

## 2022-02-16 DIAGNOSIS — M109 Gout, unspecified: Secondary | ICD-10-CM | POA: Diagnosis not present

## 2022-02-16 DIAGNOSIS — Z0000 Encounter for general adult medical examination without abnormal findings: Secondary | ICD-10-CM | POA: Diagnosis not present

## 2022-02-16 DIAGNOSIS — N182 Chronic kidney disease, stage 2 (mild): Secondary | ICD-10-CM | POA: Diagnosis not present

## 2022-02-16 DIAGNOSIS — Z8673 Personal history of transient ischemic attack (TIA), and cerebral infarction without residual deficits: Secondary | ICD-10-CM | POA: Diagnosis not present

## 2022-02-16 DIAGNOSIS — Z1331 Encounter for screening for depression: Secondary | ICD-10-CM | POA: Diagnosis not present

## 2022-04-21 ENCOUNTER — Ambulatory Visit (INDEPENDENT_AMBULATORY_CARE_PROVIDER_SITE_OTHER): Payer: Medicare HMO

## 2022-04-21 ENCOUNTER — Ambulatory Visit: Payer: Medicare HMO | Admitting: Orthopaedic Surgery

## 2022-04-21 ENCOUNTER — Encounter: Payer: Self-pay | Admitting: Orthopaedic Surgery

## 2022-04-21 DIAGNOSIS — M25562 Pain in left knee: Secondary | ICD-10-CM

## 2022-04-21 DIAGNOSIS — M170 Bilateral primary osteoarthritis of knee: Secondary | ICD-10-CM

## 2022-04-21 MED ORDER — METHYLPREDNISOLONE ACETATE 40 MG/ML IJ SUSP
80.0000 mg | INTRAMUSCULAR | Status: AC | PRN
  Administered 2022-04-21: 80 mg via INTRA_ARTICULAR

## 2022-04-21 MED ORDER — BUPIVACAINE HCL 0.25 % IJ SOLN
2.0000 mL | INTRAMUSCULAR | Status: AC | PRN
  Administered 2022-04-21: 2 mL via INTRA_ARTICULAR

## 2022-04-21 MED ORDER — LIDOCAINE HCL 1 % IJ SOLN
3.0000 mL | INTRAMUSCULAR | Status: AC | PRN
  Administered 2022-04-21: 3 mL

## 2022-04-21 MED ORDER — LIDOCAINE HCL 1 % IJ SOLN
2.0000 mL | INTRAMUSCULAR | Status: AC | PRN
  Administered 2022-04-21: 2 mL

## 2022-04-21 NOTE — Progress Notes (Signed)
Office Visit Note   Patient: Kenneth Harper           Date of Birth: [birth year]           MRN: [id number] Visit Date: 04/21/2022              Requested by: Wenda Low, MD [address],  [place] [address] PCP: Wenda Low, MD   Assessment & Plan: Visit Diagnoses:  1. Acute pain of left knee   2. Bilateral primary osteoarthritis of knee     Plan: Kenneth Harper is a pleasant 81 year old gentleman who comes in every 3 months for cortisone injections into his bilateral knees.  He has a history of advanced tricompartmental arthritis of both of his knees.  He gets nearly 3 months relief when he has the injections.  He also is here because he slipped at home and hit a decorative piece in his house and had pain and swelling in his left knee.  X-rays were reassuring.  The left knee had an effusion and almost 30 cc of blood-tinged fluid was aspirated.  Both knees were injected with cortisone today  Follow-Up Instructions: Return if symptoms worsen or fail to improve.   Orders:  Orders Placed This Encounter  Procedures   Large Joint Inj: bilateral knee   XR KNEE 3 VIEW LEFT   No orders of the defined types were placed in this encounter.     Procedures: Large Joint Inj: bilateral knee on 04/21/2022 4:38 PM Indications: pain and diagnostic evaluation Details: 25 G 1.5 in needle, anteromedial approach  Arthrogram: No  Medications (Right): 2 mL lidocaine 1 %; 2 mL bupivacaine 0.25 %; 80 mg methylPREDNISolone acetate 40 MG/ML Medications (Left): 3 mL lidocaine 1 %; 80 mg methylPREDNISolone acetate 40 MG/ML Aspirate (Left): 30 mL blood-tinged Outcome: tolerated well, no immediate complications Procedure, treatment alternatives, risks and benefits explained, specific risks discussed. Consent was given by the patient.       Clinical Data: No additional findings.   Subjective: Chief Complaint  Patient presents with   Left Knee - Pain   Right Knee -  Pain    HPI Kenneth Harper comes in today for cortisone injections into both of his knees.  He has been getting these about every 3 months and finds them helpful.  He does get good relief for about 2-1/2 months.  He also comes in today because he slipped and hit a decorative piece in his house and had increased pain and swelling in his left knee. Review of Systems  All other systems reviewed and are negative.    Objective: Vital Signs: There were no vitals taken for this visit.  Physical Exam Constitutional:      Appearance: Normal appearance.  Pulmonary:     Effort: Pulmonary effort is normal.  Neurological:     General: No focal deficit present.     Mental Status: He is alert.     Ortho Exam Left knee: He does have a moderate effusion.  No erythema or warmth.  No particular tenderness over the medial or lateral joint line crepitus with range of motion.  No ecchymosis.  He does have decreased flexion secondary to the effusion Right knee: No effusion erythema or warmth.  Compartments are soft nontender Specialty Comments:  No specialty comments available.  Imaging: XR KNEE 3 VIEW LEFT  Result Date: 04/21/2022 Three-view radiographs of his left knee were reviewed today.  He has tricompartmental advanced arthritic changes with  bone-on-bone arthritis in all 3 compartments he does have varus alignment.  He has subchondral sclerosis.  There are no evidence of any acute fractures    PMFS History: Patient Active Problem List   Diagnosis Date Noted   Essential hypertension 08/13/2020   Transaminitis    Steroid-induced hyperglycemia    Prediabetes    Contusion of right knee    Thalamic hemorrhage with stroke ([place]) 08/06/2020   Hemorrhagic stroke (HCC)    Memory loss    Dyslipidemia    Elevated blood pressure reading    AKI (acute kidney injury) ([place])    Primary osteoarthritis of right knee    ICH (intracerebral hemorrhage) ([facility]) 07/31/2020   Bilateral primary osteoarthritis  of knee 08/10/2019   Carpal tunnel syndrome on both sides 07/28/2018   Lumbar herniated disc 10/01/2015   Herniation of lumbar intervertebral disc with radiculopathy 09/30/2015    Class: Acute   Past Medical History:  Diagnosis Date   Arthritis    knees and back   Asthma    childhood   Carpal tunnel syndrome    Cataract    Diverticulitis    GERD (gastroesophageal reflux disease)    Hypercholesterolemia    Memory loss    Stroke ([facility]) 07/31/2020    Family History  Problem Relation Age of Onset   Parkinson's disease Mother    Heart failure Father    Diabetes Brother    Cancer Brother        throat   Hydrocephalus Brother    Diabetes Brother    Migraines Sister     Past Surgical History:  Procedure Laterality Date   BILATERAL CARPAL TUNNEL RELEASE     CATARACT EXTRACTION, BILATERAL Bilateral    COLONOSCOPY WITH PROPOFOL N/A 10/29/2014   Procedure: COLONOSCOPY WITH PROPOFOL;  Surgeon: Garlan Fair, MD;  Location: [facility];  Service: Endoscopy;  Laterality: N/A;   EYE SURGERY     LUMBAR LAMINECTOMY/DECOMPRESSION MICRODISCECTOMY N/A 10/01/2015   Procedure: LEFT L2-3 MICRODISCECTOMY;  Surgeon: Jessy Oto, MD;  Location: [facility];  Service: Orthopedics;  Laterality: N/A;   Social History   Occupational History   Not on file  Tobacco Use   Smoking status: Former    Types: Cigars    Quit date: 10/22/2010    Years since quitting: 11.5   Smokeless tobacco: Never  Vaping Use   Vaping Use: Never used  Substance and Sexual Activity   Alcohol use: No    Alcohol/week: 0.0 standard drinks of alcohol   Drug use: No   Sexual activity: Never    Birth control/protection: Abstinence

## 2022-04-22 ENCOUNTER — Ambulatory Visit: Payer: Medicare HMO | Admitting: Orthopaedic Surgery

## 2022-06-08 DIAGNOSIS — L821 Other seborrheic keratosis: Secondary | ICD-10-CM | POA: Diagnosis not present

## 2022-07-21 ENCOUNTER — Ambulatory Visit: Payer: Self-pay

## 2022-07-21 ENCOUNTER — Encounter: Payer: Self-pay | Admitting: Sports Medicine

## 2022-07-21 ENCOUNTER — Ambulatory Visit (INDEPENDENT_AMBULATORY_CARE_PROVIDER_SITE_OTHER): Payer: Medicare HMO | Admitting: Sports Medicine

## 2022-07-21 DIAGNOSIS — M170 Bilateral primary osteoarthritis of knee: Secondary | ICD-10-CM | POA: Diagnosis not present

## 2022-07-21 DIAGNOSIS — M25462 Effusion, left knee: Secondary | ICD-10-CM | POA: Diagnosis not present

## 2022-07-21 DIAGNOSIS — M1711 Unilateral primary osteoarthritis, right knee: Secondary | ICD-10-CM | POA: Diagnosis not present

## 2022-07-21 DIAGNOSIS — M25562 Pain in left knee: Secondary | ICD-10-CM

## 2022-07-21 MED ORDER — BUPIVACAINE HCL 0.25 % IJ SOLN
2.0000 mL | INTRAMUSCULAR | Status: AC | PRN
  Administered 2022-07-21: 2 mL via INTRA_ARTICULAR

## 2022-07-21 MED ORDER — METHYLPREDNISOLONE ACETATE 40 MG/ML IJ SUSP
80.0000 mg | INTRAMUSCULAR | Status: AC | PRN
  Administered 2022-07-21: 80 mg via INTRA_ARTICULAR

## 2022-07-21 MED ORDER — LIDOCAINE HCL 1 % IJ SOLN
2.0000 mL | INTRAMUSCULAR | Status: AC | PRN
  Administered 2022-07-21: 2 mL

## 2022-07-21 MED ORDER — LIDOCAINE HCL 1 % IJ SOLN
5.0000 mL | INTRAMUSCULAR | Status: AC | PRN
  Administered 2022-07-21: 5 mL

## 2022-07-21 NOTE — Progress Notes (Signed)
Kenneth Harper - 82 y.o. male MRN [id number]  Date of birth: 1941/04/08  Office Visit Note: Visit Date: 07/21/2022 PCP: Wenda Low, MD Referred by: Wenda Low, MD  Subjective: Chief Complaint  Patient presents with   Right Knee - Pain   Left Knee - Pain   HPI: Kenneth Harper is a pleasant 82 y.o. male who presents today for bilateral knee pain with known osteoarthritis.   Previously a patient of Dr. Durward Fortes. He was last seen on 04/21/22 and had knee aspiration and injections. He gets nearly 3 months of relief from these injections. He is not interested in knee replacement.   Today, the left knee bothers him more so than the right.  Left knee is 1 and he fell onto the months ago and had to strain.  He feels like the left knee is tight and slightly swollen.  Denies any warmth, redness or fever or chills.  Does feel like his previous injections are starting to wear off.  No new injury for either knee.  Pertinent ROS were reviewed with the patient and found to be negative unless otherwise specified above in HPI.   Assessment & Plan: Visit Diagnoses:  1. Bilateral primary osteoarthritis of knee   2. Effusion, left knee   3. Acute pain of left knee    Plan: Evaluated Jaiven's knees and discussed all treatment options such as therapy, oral medication, injection therapy/aspiration, knee replacement.  He notes he has advanced arthritis of both knees, but does not at that time or he could consider placement.  Through shared decision making, elected to proceed with aspiration of his left knee effusion and subsequent injection as well as simple injection of the right knee with corticosteroid, patient tolerated well.  Recommended ice and over-the-counter anti-inflammatories for the next few days for any postinjection pain.  He did bring in his copper fit braces today, recommended he continue the compressive knee sleeves to help with swelling and support.  Return in 3 months unless he needs  something prior.  Follow-up: Return in about 3 months (around 10/20/2022) for for bilateral knees .   Meds & Orders: No orders of the defined types were placed in this encounter.   Orders Placed This Encounter  Procedures   US Guided Needle Placement - No Linked Charges     Procedures: Large Joint Inj: R knee on 07/21/2022 12:03 PM Details: 22 G 1.5 in needle, anteromedial approach Medications: 2 mL lidocaine 1 %; 2 mL bupivacaine 0.25 %; 80 mg methylPREDNISolone acetate 40 MG/ML Outcome: tolerated well, no immediate complications  Knee Injection, Right: After discussion on risks/benefits/indications, informed verbal consent was obtained and a timeout was performed, patient was seated on exam table. The patient's knee was prepped with Betadine and alcohol swab and utilizing anteromedial approach, the patient's knee was injected intraarticularly with 2:2:2 lidocaine 1%:bupivicaine 0.25%:depomedrol. Patient tolerated the procedure well without immediate complications.  Procedure, treatment alternatives, risks and benefits explained, specific risks discussed. Consent was given by the patient. Immediately prior to procedure a time out was called to verify the correct patient, procedure, equipment, support staff and site/side marked as required. Patient was prepped and draped in the usual sterile fashion.    Large Joint Inj: L knee on 07/21/2022 12:04 PM Indications: joint swelling and pain Details: 18 G 1.5 in needle, ultrasound-guided superolateral approach Medications: 5 mL lidocaine 1 %; 2 mL bupivacaine 0.25 %; 80 mg methylPREDNISolone acetate 40 MG/ML Aspirate: 29 mL clear and yellow Outcome: tolerated  well, no immediate complications  US-guided Knee Aspiration, Left: After discussion on risks/benefits/indications was provided, informed verbal consent was obtained and a timeout was performed, patient was lying supine on exam table. The knee was prepped with alcohol swab.  Utilizing  superolateral approach, approximately 5 mL of lidocaine 1% was used for local anesthesia. Then using an 18g needle on 60cc syringe, 29 mL of clear straw-colored fluid was aspirated from the knee. Utilizing the same portal, the knee joint was then injected with 2:2 bupivicaine:depomedrol.  Patient tolerated procedure well without immediate complications.  Procedure, treatment alternatives, risks and benefits explained, specific risks discussed. Consent was given by the patient. Immediately prior to procedure a time out was called to verify the correct patient, procedure, equipment, support staff and site/side marked as required. Patient was prepped and draped in the usual sterile fashion.          Clinical History: No specialty comments available.  He reports that he quit smoking about 11 years ago. His smoking use included cigars. He has never used smokeless tobacco. No results for input(s): "HGBA1C", "LABURIC" in the last 8760 hours.  Objective:   Vital Signs: There were no vitals taken for this visit.  Physical Exam  Gen: Well-appearing, in no acute distress; non-toxic CV: Regular Rate. Well-perfused. Warm.  Resp: Breathing unlabored on room air; no wheezing. Psych: Fluid speech in conversation; appropriate affect; normal thought process Neuro: Sensation intact throughout. No gross coordination deficits.   Ortho Exam - Left knee: There is a moderate effusion of the left knee without warmth or erythema.  Range of motion is restricted from 5-115 degrees compared to 0-120 degrees of both contralateral knee.  Ligamentously intact.  - Right knee: No significant effusion, no warmth or redness.  Range of motion 0-120 degrees.  Ligamentously intact.  5/5 strength in all directions. NVI.  Imaging: No results found.  Past Medical/Family/Surgical/Social History: Medications & Allergies reviewed per EMR, new medications updated. Patient Active Problem List   Diagnosis Date Noted   Essential  hypertension 08/13/2020   Transaminitis    Steroid-induced hyperglycemia    Prediabetes    Contusion of right knee    Thalamic hemorrhage with stroke ([facility]) 08/06/2020   Hemorrhagic stroke (HCC)    Memory loss    Dyslipidemia    Elevated blood pressure reading    AKI (acute kidney injury) ([place])    Primary osteoarthritis of right knee    ICH (intracerebral hemorrhage) ([facility]) 07/31/2020   Bilateral primary osteoarthritis of knee 08/10/2019   Carpal tunnel syndrome on both sides 07/28/2018   Lumbar herniated disc 10/01/2015   Herniation of lumbar intervertebral disc with radiculopathy 09/30/2015    Class: Acute   Past Medical History:  Diagnosis Date   Arthritis    knees and back   Asthma    childhood   Carpal tunnel syndrome    Cataract    Diverticulitis    GERD (gastroesophageal reflux disease)    Hypercholesterolemia    Memory loss    Stroke ([facility]) 07/31/2020   Family History  Problem Relation Age of Onset   Parkinson's disease Mother    Heart failure Father    Diabetes Brother    Cancer Brother        throat   Hydrocephalus Brother    Diabetes Brother    Migraines Sister    Past Surgical History:  Procedure Laterality Date   BILATERAL CARPAL TUNNEL RELEASE     CATARACT EXTRACTION, BILATERAL Bilateral  COLONOSCOPY WITH PROPOFOL N/A 10/29/2014   Procedure: COLONOSCOPY WITH PROPOFOL;  Surgeon: Garlan Fair, MD;  [facility];  Service: Endoscopy;  Laterality: N/A;   EYE SURGERY     LUMBAR LAMINECTOMY/DECOMPRESSION MICRODISCECTOMY N/A 10/01/2015   Procedure: LEFT L2-3 MICRODISCECTOMY;  Surgeon: Jessy Oto, MD;  Location: [facility];  Service: Orthopedics;  Laterality: N/A;   Social History   Occupational History   Not on file  Tobacco Use   Smoking status: Former    Types: Cigars    Quit date: 10/22/2010    Years since quitting: 11.7   Smokeless tobacco: Never  Vaping Use   Vaping Use: Never used  Substance and Sexual Activity   Alcohol use: No     Alcohol/week: 0.0 standard drinks of alcohol   Drug use: No   Sexual activity: Never    Birth control/protection: Abstinence

## 2022-07-21 NOTE — Progress Notes (Signed)
[facility] patient; here today for bilateral knee injections

## 2022-08-19 DIAGNOSIS — N182 Chronic kidney disease, stage 2 (mild): Secondary | ICD-10-CM | POA: Diagnosis not present

## 2022-08-19 DIAGNOSIS — E7800 Pure hypercholesterolemia, unspecified: Secondary | ICD-10-CM | POA: Diagnosis not present

## 2022-08-19 DIAGNOSIS — R413 Other amnesia: Secondary | ICD-10-CM | POA: Diagnosis not present

## 2022-08-19 DIAGNOSIS — I10 Essential (primary) hypertension: Secondary | ICD-10-CM | POA: Diagnosis not present

## 2022-08-19 DIAGNOSIS — Z8673 Personal history of transient ischemic attack (TIA), and cerebral infarction without residual deficits: Secondary | ICD-10-CM | POA: Diagnosis not present

## 2022-08-19 DIAGNOSIS — M109 Gout, unspecified: Secondary | ICD-10-CM | POA: Diagnosis not present

## 2022-08-19 DIAGNOSIS — F341 Dysthymic disorder: Secondary | ICD-10-CM | POA: Diagnosis not present

## 2022-08-19 DIAGNOSIS — R0683 Snoring: Secondary | ICD-10-CM | POA: Diagnosis not present

## 2022-10-13 ENCOUNTER — Encounter: Payer: Self-pay | Admitting: Sports Medicine

## 2022-10-13 ENCOUNTER — Ambulatory Visit: Payer: Medicare HMO | Admitting: Sports Medicine

## 2022-10-13 DIAGNOSIS — M170 Bilateral primary osteoarthritis of knee: Secondary | ICD-10-CM

## 2022-10-13 MED ORDER — METHYLPREDNISOLONE ACETATE 40 MG/ML IJ SUSP
80.0000 mg | INTRAMUSCULAR | Status: AC | PRN
  Administered 2022-10-13: 80 mg via INTRA_ARTICULAR

## 2022-10-13 MED ORDER — LIDOCAINE HCL 1 % IJ SOLN
2.0000 mL | INTRAMUSCULAR | Status: AC | PRN
  Administered 2022-10-13: 2 mL

## 2022-10-13 MED ORDER — BUPIVACAINE HCL 0.25 % IJ SOLN
2.0000 mL | INTRAMUSCULAR | Status: AC | PRN
  Administered 2022-10-13: 2 mL via INTRA_ARTICULAR

## 2022-10-13 NOTE — Progress Notes (Signed)
Kenneth Harper - 82 y.o. male MRN [id number]  Date of birth: October 31, 1940  Office Visit Note: Visit Date: 10/13/2022 PCP: Wenda Low, MD Referred by: Wenda Low, MD  Subjective: Chief Complaint  Patient presents with   Right Knee - Pain   Left Knee - Pain   HPI: Kenneth Harper is a pleasant 82 y.o. male who presents today for bilateral knee pain with known osteoarthritis.  Kenneth Harper has been doing well.  He denies any recurrence of the swelling after we aspirated the left knee 3 months ago.  Denies any new injury.  He is starting to feel the effects wear off of his previous injections.  He continues with his copper fit compression knee sleeves during the day for both knees.  Will use Tylenol and/or topical Voltaren gel for pain control.  Pertinent ROS were reviewed with the patient and found to be negative unless otherwise specified above in HPI.   Assessment & Plan: Visit Diagnoses:  1. Bilateral primary osteoarthritis of knee    Plan: [name] is here with an exacerbation of his chronic bilateral osteoarthritis.  He has done well with intermittent corticosteroid injections, through shared decision making elected to proceed with repeat into both knees today, patient tolerated well.  He will continue with Tylenol and Voltaren gel for pain control.  May use any ice for any postinjection pain.  Commended continue to stay active to help strengthen and support the knees.  He will follow-up in 3 months, may call or return sooner if any issues arise.  Follow-up: Return in about 3 months (around 01/12/2023) for b/l knees.   Meds & Orders: No orders of the defined types were placed in this encounter.   Orders Placed This Encounter  Procedures   Large Joint Inj     Procedures: Large Joint Inj: bilateral knee on 10/13/2022 9:40 AM Indications: pain Details: 22 G 1.5 in needle, anteromedial approach Medications (Right): 2 mL lidocaine 1 %; 2 mL bupivacaine 0.25 %; 80 mg methylPREDNISolone  acetate 40 MG/ML Medications (Left): 2 mL lidocaine 1 %; 2 mL bupivacaine 0.25 %; 80 mg methylPREDNISolone acetate 40 MG/ML Outcome: tolerated well, no immediate complications  Knee Injection, Right: After discussion on risks/benefits/indications, informed verbal consent was obtained and a timeout was performed, patient was seated on exam table. The patient's knee was prepped with Betadine and alcohol swab and utilizing anteromedial approach, the patient's knee was injected intraarticularly with 2:2:2 lidocaine 1%:bupivicaine 0.25%:depomedrol. Patient tolerated the procedure well without immediate complications.  Knee Injection, Left: After discussion on risks/benefits/indications, informed verbal consent was obtained and a timeout was performed, patient was seated on exam table. The patient's knee was prepped with Betadine and alcohol swab and utilizing anteromedial approach, the patient's knee was injected intraarticularly with 2:2:2 lidocaine 1%:bupivicaine 0.25%:depomedrol. Patient tolerated the procedure well without immediate complications.       Procedure, treatment alternatives, risks and benefits explained, specific risks discussed. Consent was given by the patient. Immediately prior to procedure a time out was called to verify the correct patient, procedure, equipment, support staff and site/side marked as required. Patient was prepped and draped in the usual sterile fashion.          Clinical History: No specialty comments available.  He reports that he quit smoking about 11 years ago. His smoking use included cigars. He has never used smokeless tobacco. No results for input(s): "HGBA1C", "LABURIC" in the last 8760 hours.  Objective:    Physical Exam  Gen: Well-appearing,  in no acute distress; non-toxic CV:  Well-perfused. Warm.  Resp: Breathing unlabored on room air; no wheezing. Psych: Fluid speech in conversation; appropriate affect; normal thought process Neuro:  Sensation intact throughout. No gross coordination deficits.   Ortho Exam -Bilateral knees: Trace effusion of the left knee without significant warmth or erythema, no effusion of the right knee.  Range of motion is about 5-115 degrees of the left knee, 0-120 degrees of the right knee.  Ligamentously intact.  Strength 5/5 in all directions.  Imaging:  L-knee Xray 06/2018: Films of the left knee were obtained in several projections standing.   There is near complete absence of the medial joint space with about 3 to 4  degrees of varus.  Subchondral cysts and sclerosis identified medially  with peripheral osteophytes.  Peripheral osteophytes in the lateral  compartment.  No ectopic calcification.  Significant degenerative change  of the patellofemoral joint particularly laterally with significant  narrowing.  Films are consistent with end-stage osteoarthritis   Past Medical/Family/Surgical/Social History: Medications & Allergies reviewed per EMR, new medications updated. Patient Active Problem List   Diagnosis Date Noted   Essential hypertension 08/13/2020   Transaminitis    Steroid-induced hyperglycemia    Prediabetes    Contusion of right knee    Thalamic hemorrhage with stroke 08/06/2020   Hemorrhagic stroke    Memory loss    Dyslipidemia    Elevated blood pressure reading    AKI (acute kidney injury)    Primary osteoarthritis of right knee    ICH (intracerebral hemorrhage) 07/31/2020   Bilateral primary osteoarthritis of knee 08/10/2019   Carpal tunnel syndrome on both sides 07/28/2018   Lumbar herniated disc 10/01/2015   Herniation of lumbar intervertebral disc with radiculopathy 09/30/2015    Class: Acute   Past Medical History:  Diagnosis Date   Arthritis    knees and back   Asthma    childhood   Carpal tunnel syndrome    Cataract    Diverticulitis    GERD (gastroesophageal reflux disease)    Hypercholesterolemia    Memory loss    Stroke ([facility]) 07/31/2020    Family History  Problem Relation Age of Onset   Parkinson's disease Mother    Heart failure Father    Diabetes Brother    Cancer Brother        throat   Hydrocephalus Brother    Diabetes Brother    Migraines Sister    Past Surgical History:  Procedure Laterality Date   BILATERAL CARPAL TUNNEL RELEASE     CATARACT EXTRACTION, BILATERAL Bilateral    COLONOSCOPY WITH PROPOFOL N/A 10/29/2014   Procedure: COLONOSCOPY WITH PROPOFOL;  Surgeon: Garlan Fair, MD;  [facility]: [facility];  Service: Endoscopy;  Laterality: N/A;   EYE SURGERY     LUMBAR LAMINECTOMY/DECOMPRESSION MICRODISCECTOMY N/A 10/01/2015   Procedure: LEFT L2-3 MICRODISCECTOMY;  Surgeon: Jessy Oto, MD;  Location: [facility];  Service: Orthopedics;  Laterality: N/A;   Social History   Occupational History   Not on file  Tobacco Use   Smoking status: Former    Types: Cigars    Quit date: 10/22/2010    Years since quitting: 11.9   Smokeless tobacco: Never  Vaping Use   Vaping Use: Never used  Substance and Sexual Activity   Alcohol use: No    Alcohol/week: 0.0 standard drinks of alcohol   Drug use: No   Sexual activity: Never    Birth control/protection: Abstinence

## 2022-10-13 NOTE — Progress Notes (Signed)
Requesting another injection in both knees

## 2022-10-27 DIAGNOSIS — H6123 Impacted cerumen, bilateral: Secondary | ICD-10-CM | POA: Diagnosis not present

## 2022-10-27 DIAGNOSIS — H61303 Acquired stenosis of external ear canal, unspecified, bilateral: Secondary | ICD-10-CM | POA: Diagnosis not present

## 2022-10-27 DIAGNOSIS — J309 Allergic rhinitis, unspecified: Secondary | ICD-10-CM | POA: Diagnosis not present

## 2022-10-27 DIAGNOSIS — H903 Sensorineural hearing loss, bilateral: Secondary | ICD-10-CM | POA: Diagnosis not present

## 2023-01-18 ENCOUNTER — Ambulatory Visit: Payer: Medicare HMO | Admitting: Sports Medicine

## 2023-01-18 ENCOUNTER — Encounter: Payer: Self-pay | Admitting: Sports Medicine

## 2023-01-18 DIAGNOSIS — M25561 Pain in right knee: Secondary | ICD-10-CM | POA: Diagnosis not present

## 2023-01-18 DIAGNOSIS — M25562 Pain in left knee: Secondary | ICD-10-CM | POA: Diagnosis not present

## 2023-01-18 DIAGNOSIS — G8929 Other chronic pain: Secondary | ICD-10-CM | POA: Diagnosis not present

## 2023-01-18 DIAGNOSIS — M170 Bilateral primary osteoarthritis of knee: Secondary | ICD-10-CM

## 2023-01-18 MED ORDER — BUPIVACAINE HCL 0.25 % IJ SOLN
2.00 mL | INTRAMUSCULAR | Status: AC | PRN
Start: 2023-01-18 — End: 2023-01-18
  Administered 2023-01-18: 2 mL via INTRA_ARTICULAR

## 2023-01-18 MED ORDER — METHYLPREDNISOLONE ACETATE 80 MG/ML IJ SUSP
80.0000 mg | INTRAMUSCULAR | Status: AC | PRN
Start: 2023-01-18 — End: 2023-01-18
  Administered 2023-01-18: 80 mg via INTRA_ARTICULAR

## 2023-01-18 MED ORDER — LIDOCAINE HCL 1 % IJ SOLN
2.0000 mL | INTRAMUSCULAR | Status: AC | PRN
Start: 2023-01-18 — End: 2023-01-18
  Administered 2023-01-18: 2 mL

## 2023-01-18 MED ORDER — METHYLPREDNISOLONE ACETATE 80 MG/ML IJ SUSP
80.00 mg | INTRAMUSCULAR | Status: AC | PRN
Start: 2023-01-18 — End: 2023-01-18
  Administered 2023-01-18: 80 mg via INTRA_ARTICULAR

## 2023-01-18 NOTE — Progress Notes (Signed)
Kenneth Harper - 82 y.o. male MRN [id number]  Date of birth: 10-06-1940  Office Visit Note: Visit Date: 01/18/2023 PCP: Georgann Housekeeper, MD Referred by: Georgann Housekeeper, MD  Subjective: Chief Complaint  Patient presents with   Left Knee - Pain   Right Knee - Pain   HPI: Kenneth Harper is a pleasant 82 y.o. male who presents today for bilateral knee pain with known osteoarthritis.  Last seen a little over 3 months ago. Had bilateral CS injections which lasted for 2.5 months or so and given him good relief.  He is currently in the process of moving homes.  He is doing a lot of steps which do mildly exacerbate his pain.  Has not noticed any reaccumulation of the effusion.  Pertinent ROS were reviewed with the patient and found to be negative unless otherwise specified above in HPI.   Assessment & Plan: Visit Diagnoses:  1. Bilateral primary osteoarthritis of knee   2. Bilateral chronic knee pain    Plan: Kenneth Harper presents with bilateral knee osteoarthritis, here over the last 2 weeks or so has had an exacerbation of his pain.  He has done well with intermittent corticosteroid injections, through shared decision making elected to proceed with repeat injection into both knees today.  He may use Tylenol and/or Voltaren gel for any pain control.  May use ice for any postinjection pain.  Did want him to have about 48 hours of modified activity from his shot and then may return to activity as tolerated.  He will follow-up with me in 3 months, may call or return sooner if any issues arise.  Follow-up: Return in about 3 months (around 04/20/2023) for bilateral knee with injections (30-min).   Meds & Orders: No orders of the defined types were placed in this encounter.   Orders Placed This Encounter  Procedures   Large Joint Inj   Large Joint Inj     Procedures: Large Joint Inj: R knee on 01/18/2023 11:16 AM Indications: pain Details: 22 G 1.5 in needle, anteromedial approach Medications: 2  mL lidocaine 1 %; 2 mL bupivacaine 0.25 %; 80 mg methylPREDNISolone acetate 80 MG/ML Outcome: tolerated well, no immediate complications  Knee Injection, right: After discussion on risks/benefits/indications, informed verbal consent was obtained and a timeout was performed, patient was seated on exam table. The patient's knee was prepped with Betadine and alcohol swab and utilizing anteromedial approach, the patient's knee was injected intraarticularly with 2:2:1 lidocaine 1%:bupivicaine 0.25%:depomedrol. Patient tolerated the procedure well without immediate complications.  Procedure, treatment alternatives, risks and benefits explained, specific risks discussed. Consent was given by the patient. Immediately prior to procedure a time out was called to verify the correct patient, procedure, equipment, support staff and site/side marked as required. Patient was prepped and draped in the usual sterile fashion.    Large Joint Inj: L knee on 01/18/2023 11:17 AM Indications: pain Details: 22 G 1.5 in needle, anteromedial approach Medications: 2 mL lidocaine 1 %; 2 mL bupivacaine 0.25 %; 80 mg methylPREDNISolone acetate 80 MG/ML Outcome: tolerated well, no immediate complications  Knee Injection, left: After discussion on risks/benefits/indications, informed verbal consent was obtained and a timeout was performed, patient was seated on exam table. The patient's knee was prepped with Betadine and alcohol swab and utilizing anteromedial approach, the patient's knee was injected intraarticularly with 2:2:1 lidocaine 1%:bupivicaine 0.25%:depomedrol. Patient tolerated the procedure well without immediate complications.  Procedure, treatment alternatives, risks and benefits explained, specific risks discussed. Consent was given  by the patient. Immediately prior to procedure a time out was called to verify the correct patient, procedure, equipment, support staff and site/side marked as required. Patient was  prepped and draped in the usual sterile fashion.          Clinical History: No specialty comments available.  He reports that he quit smoking about 12 years ago. His smoking use included cigars. He has never used smokeless tobacco. No results for input(s): "HGBA1C", "LABURIC" in the last 8760 hours.  Objective:    Physical Exam  Gen: Well-appearing, in no acute distress; non-toxic CV: Well-perfused. Warm.  Resp: Breathing unlabored on room air; no wheezing. Psych: Fluid speech in conversation; appropriate affect; normal thought process Neuro: Sensation intact throughout. No gross coordination deficits.   Ortho Exam -Bilateral knees: There is trace effusion of bilateral knees without significant warmth, redness or erythema.  Range of motion of the left knee 5-115 degrees, right knee 3-120 degrees.  Strength 5/5 in all directions.  There is bony bossing of the medial joint compartment.  Imaging: No results found.  Past Medical/Family/Surgical/Social History: Medications & Allergies reviewed per EMR, new medications updated. Patient Active Problem List   Diagnosis Date Noted   Essential hypertension 08/13/2020   Transaminitis    Steroid-induced hyperglycemia    Prediabetes    Contusion of right knee    Thalamic hemorrhage with stroke (HCC) 08/06/2020   Hemorrhagic stroke (HCC)    Memory loss    Dyslipidemia    Elevated blood pressure reading    AKI (acute kidney injury) (HCC)    Primary osteoarthritis of right knee    ICH (intracerebral hemorrhage) (HCC) 07/31/2020   Bilateral primary osteoarthritis of knee 08/10/2019   Carpal tunnel syndrome on both sides 07/28/2018   Lumbar herniated disc 10/01/2015   Herniation of lumbar intervertebral disc with radiculopathy 09/30/2015    Class: Acute   Past Medical History:  Diagnosis Date   Arthritis    knees and back   Asthma    childhood   Carpal tunnel syndrome    Cataract    Diverticulitis    GERD (gastroesophageal  reflux disease)    Hypercholesterolemia    Memory loss    Stroke (HCC) 07/31/2020   Family History  Problem Relation Age of Onset   Parkinson's disease Mother    Heart failure Father    Diabetes Brother    Cancer Brother        throat   Hydrocephalus Brother    Diabetes Brother    Migraines Sister    Past Surgical History:  Procedure Laterality Date   BILATERAL CARPAL TUNNEL RELEASE     CATARACT EXTRACTION, BILATERAL Bilateral    COLONOSCOPY WITH PROPOFOL N/A 10/29/2014   Procedure: COLONOSCOPY WITH PROPOFOL;  Surgeon: Charolett Bumpers, MD;  Location: [facility];  Service: Endoscopy;  Laterality: N/A;   EYE SURGERY     LUMBAR LAMINECTOMY/DECOMPRESSION MICRODISCECTOMY N/A 10/01/2015   Procedure: LEFT L2-3 MICRODISCECTOMY;  Surgeon: Kerrin Champagne, MD;  Location: [facility];  Service: Orthopedics;  Laterality: N/A;   Social History   Occupational History   Not on file  Tobacco Use   Smoking status: Former    Types: Cigars    Quit date: 10/22/2010    Years since quitting: 12.2   Smokeless tobacco: Never  Vaping Use   Vaping Use: Never used  Substance and Sexual Activity   Alcohol use: No    Alcohol/week: 0.0 standard drinks of alcohol   Drug use:  No   Sexual activity: Never    Birth control/protection: Abstinence

## 2023-01-18 NOTE — Progress Notes (Signed)
Bilateral knee pain Here for injections today States they usually help for about 10 weeks then slowly wear off

## 2023-04-20 ENCOUNTER — Telehealth: Payer: Self-pay

## 2023-04-20 NOTE — Telephone Encounter (Signed)
Patient called wanting to schedule BIL knee injections. CB # [phone]

## 2023-04-27 ENCOUNTER — Encounter: Payer: Self-pay | Admitting: Sports Medicine

## 2023-04-27 ENCOUNTER — Ambulatory Visit: Payer: Medicare HMO | Admitting: Sports Medicine

## 2023-04-27 DIAGNOSIS — G8929 Other chronic pain: Secondary | ICD-10-CM | POA: Diagnosis not present

## 2023-04-27 DIAGNOSIS — M25561 Pain in right knee: Secondary | ICD-10-CM | POA: Diagnosis not present

## 2023-04-27 DIAGNOSIS — M170 Bilateral primary osteoarthritis of knee: Secondary | ICD-10-CM

## 2023-04-27 DIAGNOSIS — M25562 Pain in left knee: Secondary | ICD-10-CM | POA: Diagnosis not present

## 2023-04-27 MED ORDER — METHYLPREDNISOLONE ACETATE 80 MG/ML IJ SUSP
1.5000 mg | INTRAMUSCULAR | Status: AC | PRN
Start: 2023-04-27 — End: 2023-04-27
  Administered 2023-04-27: 1.5 mg via INTRA_ARTICULAR

## 2023-04-27 MED ORDER — BUPIVACAINE HCL 0.25 % IJ SOLN
2.0000 mL | INTRAMUSCULAR | Status: AC | PRN
Start: 2023-04-27 — End: 2023-04-27
  Administered 2023-04-27: 2 mL via INTRA_ARTICULAR

## 2023-04-27 MED ORDER — LIDOCAINE HCL 1 % IJ SOLN
2.0000 mL | INTRAMUSCULAR | Status: AC | PRN
Start: 2023-04-27 — End: 2023-04-27
  Administered 2023-04-27: 2 mL

## 2023-04-27 NOTE — Progress Notes (Addendum)
Office Visit Note   Patient: Kenneth Harper           Date of Birth: 1940-12-15           MRN: [id number] Visit Date: 04/27/2023              Requested by: Georgann Housekeeper, MD [address] [place],  [place] [address] PCP: Georgann Housekeeper, MD  Medical Resident, Sports Medicine Fellow - Attending Physician Addendum:   I have independently interviewed and examined the patient myself. I have discussed the above with the original author and agree with their documentation. My edits for correction/addition/clarification have been made, see any changes above and below.   In summary, very pleasant 82 year old male who has seen me for the last few years with bilateral osteoarthritis of the knees.  He has advanced tricompartmental change but has done well with intermittent corticosteroid injections, we did repeat these today.  These usually last about 2.5-3 months.  We did discuss potentially trialing a long-acting corticosteroid such as Zilretta in the future to see if this could give him further improvement if he desires.  Discussed activity modification, ice and Tylenol for any postinjection pain.  He will follow-up with me in 3 months as needed, continue to recommend staying active as able.  Madelyn Brunner, DO Primary Care Sports Medicine Physician  [facility] - Orthopedics   Assessment & Plan: Visit Diagnoses:  1. Bilateral primary osteoarthritis of knee   2. Bilateral chronic knee pain    Plan: Patient with a history of bilateral osteoarthritis with exacerbation. Patient at this time has no plans to opt for surgical intervention for his knees and has discussed that he likely will not want to get knee replacements in the future.  Patient has had good relief with steroid injections and has been receiving them approximately every 3 to 4 months.  We did proceed with steroid injections bilaterally into each knee today.  Patient was able to tolerate the procedure well without  any difficulties.  Patient was advised to follow-up again in 3 to 4 months for repeat injections if he so chooses.  Patient is agreeable.  Follow-Up Instructions: Return in about 3 months (around 07/28/2023), or if symptoms worsen or fail to improve.   Orders:  Orders Placed This Encounter  Procedures   Large Joint Inj R   Large Joint Inj L   Meds ordered this encounter  Medications   bupivacaine (MARCAINE) 0.25 % (with pres) injection 2 mL   bupivacaine (MARCAINE) 0.25 % (with pres) injection 2 mL   lidocaine (XYLOCAINE) 1 % (with pres) injection 2 mL   lidocaine (XYLOCAINE) 1 % (with pres) injection 2 mL   methylPREDNISolone acetate (DEPO-MEDROL) injection 1.5 mg   methylPREDNISolone acetate (DEPO-MEDROL) injection 1.5 mg      Procedures: Large Joint Inj R on 04/27/2023 10:19 AM Indications: pain Details: 22 G 1.5 in needle, anteromedial approach Medications: 2 mL lidocaine 1 %; 2 mL bupivacaine 0.25 %; 1.5 mg methylPREDNISolone acetate 80 MG/ML  After discussion on risks/benefits/indications, informed verbal consent was obtained and a timeout was performed, patient was seated on exam table. The patient's knee was prepped with Betadine and alcohol swab and utilizing anteromedial approach, the patient's knee was injected intraarticularly with 2:2:1 lidocaine 1%:bupivicaine 0.25%:depomedrol. Patient tolerated the procedure well without immediate complications.    Large Joint Inj L on 04/27/2023 10:26 AM Indications: pain Details: 22 G 1.5 in needle, anterolateral approach Medications: 2 mL lidocaine  1 %; 2 mL bupivacaine 0.25 %; 1.5 mg methylPREDNISolone acetate 80 MG/ML  After discussion on risks/benefits/indications, informed verbal consent was obtained and a timeout was performed, patient was seated on exam table. The patient's knee was prepped with Betadine and alcohol swab and utilizing anterolateral approach, the patient's knee was injected intraarticularly with 2:2:1  lidocaine 1%:bupivicaine 0.25%:depomedrol. Patient tolerated the procedure well without immediate complications.  Procedure, treatment alternatives, risks and benefits explained, specific risks discussed. Consent was given by the patient. Immediately prior to procedure a time out was called to verify the correct patient, procedure, equipment, support staff and site/side marked as required. Patient was prepped and draped in the usual sterile fashion.       Clinical Data: No additional findings.   Subjective: Chief Complaint  Patient presents with   Right Knee - Pain   Left Knee - Pain    Patient is here for follow-up of bilateral knee pain.  Patient states that his knees for the past 1 week have been bothering him.  Patient usually gets knee injections approximately every 3 to 4 months.  Patient states that he does not want to get a knee replacement and would prefer to just do the injections while he can.  Patient states that he is in the process of moving and that moving is sometimes aggravated his knees little bit.  Patient otherwise is in good spirits and has no other concerns at this time.  Patient is doing well    Review of Systems   Objective:  Physical Exam Knee, Bilateral:. There is some mild swelling of bilateral knees noted, no erythema, no overt bony abnormality.  Range of motion is good bilaterally with flexion and extension.  There is some tenderness to palpation of the suprapatellar tendon bilaterally as well as over the medial lateral joint lines.  No condyle tenderness.  Palpation No asymmetric warmth.  Some mild knee crepitus noted.  Strength is 5 out of 5 with knee flexion and extension.   Provocative Testing:    - Patella:   - Patellar grind/compression: POS   Specialty Comments:  No specialty comments available.  Imaging:  XR KNEE 3 VIEW LEFT Three-view radiographs of his left knee were reviewed today.  He has  tricompartmental advanced arthritic  changes with bone-on-bone arthritis in  all 3 compartments he does have varus alignment.  He has subchondral  sclerosis.  There are no evidence of any acute fractures  Narrative & Impression  CLINICAL DATA:  82 year old male with history of trauma from a fall 5 days ago complaining of right anterior knee pain.   EXAM: RIGHT KNEE - 1-2 VIEW   COMPARISON:  06/21/2018.   FINDINGS: No definite acute displaced fracture or dislocation. Severe joint space narrowing, subchondral sclerosis, subchondral cyst formation and osteophyte formation is again noted, most pronounced in the medial and patellofemoral compartments. Large suprapatellar joint effusion. Vascular calcifications noted in the medial aspect of the right thigh.   IMPRESSION: 1. No acute displaced fracture or dislocation. 2. Large suprapatellar joint effusion. 3. Severe tricompartmental osteoarthritis, most pronounced in the medial and patellofemoral compartments.     Electronically Signed   By: Trudie Reed M.D.   On: 08/03/2020 14:25    PMFS History: Patient Active Problem List   Diagnosis Date Noted   Essential hypertension 08/13/2020   Transaminitis    Steroid-induced hyperglycemia    Prediabetes    Contusion of right knee    Thalamic hemorrhage with stroke (HCC) 08/06/2020  Hemorrhagic stroke (HCC)    Memory loss    Dyslipidemia    Elevated blood pressure reading    AKI (acute kidney injury) (HCC)    Primary osteoarthritis of right knee    ICH (intracerebral hemorrhage) (HCC) 07/31/2020   Bilateral primary osteoarthritis of knee 08/10/2019   Carpal tunnel syndrome on both sides 07/28/2018   Lumbar herniated disc 10/01/2015   Herniation of lumbar intervertebral disc with radiculopathy 09/30/2015    Class: Acute   Past Medical History:  Diagnosis Date   Arthritis    knees and back   Asthma    childhood   Carpal tunnel syndrome    Cataract    Diverticulitis    GERD (gastroesophageal reflux  disease)    Hypercholesterolemia    Memory loss    Stroke (HCC) 07/31/2020    Family History  Problem Relation Age of Onset   Parkinson's disease Mother    Heart failure Father    Diabetes Brother    Cancer Brother        throat   Hydrocephalus Brother    Diabetes Brother    Migraines Sister     Past Surgical History:  Procedure Laterality Date   BILATERAL CARPAL TUNNEL RELEASE     CATARACT EXTRACTION, BILATERAL Bilateral    COLONOSCOPY WITH PROPOFOL N/A 10/29/2014   Procedure: COLONOSCOPY WITH PROPOFOL;  Surgeon: Charolett Bumpers, MD;  Location: [facility];  Service: Endoscopy;  Laterality: N/A;   EYE SURGERY     LUMBAR LAMINECTOMY/DECOMPRESSION MICRODISCECTOMY N/A 10/01/2015   Procedure: LEFT L2-3 MICRODISCECTOMY;  Surgeon: Kerrin Champagne, MD;  Location: [facility];  Service: Orthopedics;  Laterality: N/A;   Social History   Occupational History   Not on file  Tobacco Use   Smoking status: Former    Types: Cigars    Quit date: 10/22/2010    Years since quitting: 12.5   Smokeless tobacco: Never  Vaping Use   Vaping status: Never Used  Substance and Sexual Activity   Alcohol use: No    Alcohol/week: 0.0 standard drinks of alcohol   Drug use: No   Sexual activity: Never    Birth control/protection: Abstinence

## 2023-06-02 DIAGNOSIS — N182 Chronic kidney disease, stage 2 (mild): Secondary | ICD-10-CM | POA: Diagnosis not present

## 2023-06-02 DIAGNOSIS — G319 Degenerative disease of nervous system, unspecified: Secondary | ICD-10-CM | POA: Diagnosis not present

## 2023-06-02 DIAGNOSIS — Z0000 Encounter for general adult medical examination without abnormal findings: Secondary | ICD-10-CM | POA: Diagnosis not present

## 2023-06-02 DIAGNOSIS — M109 Gout, unspecified: Secondary | ICD-10-CM | POA: Diagnosis not present

## 2023-06-02 DIAGNOSIS — R7303 Prediabetes: Secondary | ICD-10-CM | POA: Diagnosis not present

## 2023-06-02 DIAGNOSIS — I10 Essential (primary) hypertension: Secondary | ICD-10-CM | POA: Diagnosis not present

## 2023-06-02 DIAGNOSIS — I77810 Thoracic aortic ectasia: Secondary | ICD-10-CM | POA: Diagnosis not present

## 2023-06-02 DIAGNOSIS — N400 Enlarged prostate without lower urinary tract symptoms: Secondary | ICD-10-CM | POA: Diagnosis not present

## 2023-06-02 DIAGNOSIS — Z23 Encounter for immunization: Secondary | ICD-10-CM | POA: Diagnosis not present

## 2023-06-02 DIAGNOSIS — E7800 Pure hypercholesterolemia, unspecified: Secondary | ICD-10-CM | POA: Diagnosis not present

## 2023-06-02 DIAGNOSIS — Z8673 Personal history of transient ischemic attack (TIA), and cerebral infarction without residual deficits: Secondary | ICD-10-CM | POA: Diagnosis not present

## 2023-07-30 DIAGNOSIS — F0670 Mild neurocognitive disorder due to known physiological condition without behavioral disturbance: Secondary | ICD-10-CM | POA: Diagnosis not present

## 2023-07-30 DIAGNOSIS — G319 Degenerative disease of nervous system, unspecified: Secondary | ICD-10-CM | POA: Diagnosis not present

## 2023-07-30 DIAGNOSIS — G3184 Mild cognitive impairment, so stated: Secondary | ICD-10-CM | POA: Diagnosis not present

## 2023-07-30 DIAGNOSIS — F341 Dysthymic disorder: Secondary | ICD-10-CM | POA: Diagnosis not present

## 2023-08-05 ENCOUNTER — Ambulatory Visit: Payer: Medicare HMO | Admitting: Sports Medicine

## 2023-08-05 ENCOUNTER — Encounter: Payer: Self-pay | Admitting: Sports Medicine

## 2023-08-05 ENCOUNTER — Other Ambulatory Visit: Payer: Self-pay

## 2023-08-05 DIAGNOSIS — M170 Bilateral primary osteoarthritis of knee: Secondary | ICD-10-CM | POA: Diagnosis not present

## 2023-08-05 DIAGNOSIS — G8929 Other chronic pain: Secondary | ICD-10-CM

## 2023-08-05 DIAGNOSIS — M25562 Pain in left knee: Secondary | ICD-10-CM | POA: Diagnosis not present

## 2023-08-05 DIAGNOSIS — M25561 Pain in right knee: Secondary | ICD-10-CM | POA: Diagnosis not present

## 2023-08-05 MED ORDER — LIDOCAINE HCL 1 % IJ SOLN
2.0000 mL | INTRAMUSCULAR | Status: AC | PRN
Start: 2023-08-05 — End: 2023-08-05
  Administered 2023-08-05: 2 mL

## 2023-08-05 MED ORDER — METHYLPREDNISOLONE ACETATE 40 MG/ML IJ SUSP
40.0000 mg | INTRAMUSCULAR | Status: AC | PRN
  Administered 2023-08-05: 40 mg via INTRA_ARTICULAR

## 2023-08-05 MED ORDER — BUPIVACAINE HCL 0.25 % IJ SOLN
2.0000 mL | INTRAMUSCULAR | Status: AC | PRN
Start: 2023-08-05 — End: 2023-08-05
  Administered 2023-08-05: 2 mL via INTRA_ARTICULAR

## 2023-08-05 MED ORDER — LIDOCAINE HCL 1 % IJ SOLN
2.0000 mL | INTRAMUSCULAR | Status: AC | PRN
  Administered 2023-08-05: 2 mL

## 2023-08-05 NOTE — Progress Notes (Signed)
Patient says that his knees started feeling painful again a couple of weeks ago. He says that his pain is the same as it always is, and that they were not bothering him again until recently. He wears braces on each knee and says that those do help some.

## 2023-08-05 NOTE — Progress Notes (Signed)
Kenneth Harper - 83 y.o. male MRN [id number]  Date of birth: [birth year]  Office Visit Note: Visit Date: 08/05/2023 PCP: Georgann Housekeeper, MD Referred by: Georgann Housekeeper, MD  Subjective: Chief Complaint  Patient presents with   Right Knee - Pain   Left Knee - Pain   HPI: Kenneth Harper is a pleasant 83 y.o. male who presents today for bilateral knee osteoarthritis with recent pain.  [name] has had chronic bilateral knee pain with advanced arthritis, his left knee has complete collapse of the tricompartmental joint.  He has continued to get good relief with almost 3 months pain relief from corticosteroid injections.  I last was back in early October and here over the last 1-2 weeks his pain has somewhat returned.  He uses his knee compression sleeves and bracing as well as Tylenol occasionally.  Pertinent ROS were reviewed with the patient and found to be negative unless otherwise specified above in HPI.   Assessment & Plan: Visit Diagnoses:  1. Bilateral primary osteoarthritis of knee   2. Bilateral chronic knee pain    Plan: Impression is acute on chronic bilateral knee pain with advanced osteoarthritis.  We discussed with Kenneth Harper his left knee has bone-on-bone change in all compartments and does have a degree of significant varus with pseudo instability.  For both knees however, he has received nearly 3 months of relief at least with each corticosteroid injection, given this we will hold off on discussion on knee replacement given that he is getting good relief from this.  We did discuss that ultimately if the corticosteroids are not providing good relief, he would be a candidate for knee replacement.  We did proceed with bilateral knee corticosteroid injections today, patient tolerated well.  May use ice/heat and/or Tylenol for any postinjection pain.  Recommended continuing to stay active.  He will follow-up in 3 months, we will obtain new x-rays of the extremity at that  visit.  Follow-up: Return in about 3 months (around 11/03/2023) for for b/l knee OA (repeat Knee XR's) - 30-min poss inj.   Meds & Orders: No orders of the defined types were placed in this encounter.   Orders Placed This Encounter  Procedures   [place] Extrem Low Left Comp     Procedures: Large Joint Inj: R knee on 08/05/2023 11:21 AM Indications: pain Details: 22 G 1.5 in needle, anterolateral approach Medications: 2 mL lidocaine 1 %; 2 mL bupivacaine 0.25 %; 40 mg methylPREDNISolone acetate 40 MG/ML Outcome: tolerated well, no immediate complications  Knee Injection, Right: After discussion on risks/benefits/indications, informed verbal consent was obtained and a timeout was performed, patient was seated on exam table. The patient's knee was prepped with Betadine and alcohol swab and utilizing anterolateral approach, the patient's knee was injected intraarticularly with 2:2:1 lidocaine 1%:bupivicaine 0.25%:depomedrol. Patient tolerated the procedure well without immediate complications.  Procedure, treatment alternatives, risks and benefits explained, specific risks discussed. Consent was given by the patient. Patient was prepped and draped in the usual sterile fashion.    Large Joint Inj: L knee on 08/05/2023 11:21 AM Indications: pain Details: 22 G 1.5 in needle, anterolateral approach Medications: 2 mL lidocaine 1 %; 2 mL bupivacaine 0.25 %; 40 mg methylPREDNISolone acetate 40 MG/ML Outcome: tolerated well, no immediate complications  Knee Injection, Left: After discussion on risks/benefits/indications, informed verbal consent was obtained and a timeout was performed, patient was seated on exam table. The patient's knee was prepped with Betadine and alcohol swab and utilizing anterolateral approach,  the patient's knee was injected intraarticularly with 2:2:1 lidocaine 1%:bupivicaine 0.25%:depomedrol. Patient tolerated the procedure well without immediate complications.  Procedure,  treatment alternatives, risks and benefits explained, specific risks discussed. Consent was given by the patient. Patient was prepped and draped in the usual sterile fashion.          Clinical History: No specialty comments available.  He reports that he quit smoking about 12 years ago. His smoking use included cigars. He has never used smokeless tobacco. No results for input(s): "HGBA1C", "LABURIC" in the last 8760 hours.  Objective:    Physical Exam  Gen: Well-appearing, in no acute distress; non-toxic CV: Well-perfused. Warm.  Resp: Breathing unlabored on room air; no wheezing. Psych: Fluid speech in conversation; appropriate affect; normal thought process  Ortho Exam - Bilateral knees: There is trace effusion of bilateral knees without warmth or redness.  The knees following to a varus fashion there is some pseudo instability of the left greater than right knee with varus and valgus stress testing.  There is notable patellar crepitus and grating through all planes of motion.  The patient walks unassisted.  Imaging:  [place] Extrem Low Left Comp Limited musculoskeletal ultrasound of the left knee, right knee was  performed today.  Evaluation of the suprapatellar pouch shows a trace  effusion in each knee without significant fluid accumulation.  Overlying  patella was seen with cartilage loss and small spurring.  Overlying  quadricep tendon evaluated bilaterally without evidence of tearing or  significant tendinopathy.  *Review of left and right knee x-ray from 04/21/2022 was performed today.  There is complete bone-on-bone tricompartmental change of the left knee, the right knee has severe tricompartmental osteoarthritis as well but not quite as severe.  There is notable subchondral sclerosis.  Past Medical/Family/Surgical/Social History: Medications & Allergies reviewed per EMR, new medications updated. Patient Active Problem List   Diagnosis Date Noted   Essential hypertension  08/13/2020   Transaminitis    Steroid-induced hyperglycemia    Prediabetes    Contusion of right knee    Thalamic hemorrhage with stroke (HCC) 08/06/2020   Hemorrhagic stroke (HCC)    Memory loss    Dyslipidemia    Elevated blood pressure reading    AKI (acute kidney injury) (HCC)    Primary osteoarthritis of right knee    ICH (intracerebral hemorrhage) (HCC) 07/31/2020   Bilateral primary osteoarthritis of knee 08/10/2019   Carpal tunnel syndrome on both sides 07/28/2018   Lumbar herniated disc 10/01/2015   Herniation of lumbar intervertebral disc with radiculopathy 09/30/2015    Class: Acute   Past Medical History:  Diagnosis Date   Arthritis    knees and back   Asthma    childhood   Carpal tunnel syndrome    Cataract    Diverticulitis    GERD (gastroesophageal reflux disease)    Hypercholesterolemia    Memory loss    Stroke (HCC) 07/31/2020   Family History  Problem Relation Age of Onset   Parkinson's disease Mother    Heart failure Father    Diabetes Brother    Cancer Brother        throat   Hydrocephalus Brother    Diabetes Brother    Migraines Sister    Past Surgical History:  Procedure Laterality Date   BILATERAL CARPAL TUNNEL RELEASE     CATARACT EXTRACTION, BILATERAL Bilateral    COLONOSCOPY WITH PROPOFOL N/A 10/29/2014   Procedure: COLONOSCOPY WITH PROPOFOL;  Surgeon: Charolett Bumpers, MD;  Location:  [facility];  Service: Endoscopy;  Laterality: N/A;   EYE SURGERY     LUMBAR LAMINECTOMY/DECOMPRESSION MICRODISCECTOMY N/A 10/01/2015   Procedure: LEFT L2-3 MICRODISCECTOMY;  Surgeon: Kerrin Champagne, MD;  Location: [facility];  Service: Orthopedics;  Laterality: N/A;   Social History   Occupational History   Not on file  Tobacco Use   Smoking status: Former    Types: Cigars    Quit date: 10/22/2010    Years since quitting: 12.7   Smokeless tobacco: Never  Vaping Use   Vaping status: Never Used  Substance and Sexual Activity   Alcohol use: No     Alcohol/week: 0.0 standard drinks of alcohol   Drug use: No   Sexual activity: Never    Birth control/protection: Abstinence

## 2023-08-09 ENCOUNTER — Other Ambulatory Visit: Payer: Self-pay

## 2023-08-09 ENCOUNTER — Telehealth: Payer: Self-pay | Admitting: Sports Medicine

## 2023-08-09 ENCOUNTER — Encounter: Payer: Self-pay | Admitting: Sports Medicine

## 2023-08-09 ENCOUNTER — Ambulatory Visit (INDEPENDENT_AMBULATORY_CARE_PROVIDER_SITE_OTHER): Payer: Medicare HMO | Admitting: Sports Medicine

## 2023-08-09 DIAGNOSIS — M25462 Effusion, left knee: Secondary | ICD-10-CM | POA: Diagnosis not present

## 2023-08-09 DIAGNOSIS — M170 Bilateral primary osteoarthritis of knee: Secondary | ICD-10-CM | POA: Diagnosis not present

## 2023-08-09 MED ORDER — BUPIVACAINE HCL 0.25 % IJ SOLN
2.0000 mL | INTRAMUSCULAR | Status: AC | PRN
  Administered 2023-08-09: 2 mL via INTRA_ARTICULAR

## 2023-08-09 MED ORDER — METHYLPREDNISOLONE ACETATE 40 MG/ML IJ SUSP
40.0000 mg | INTRAMUSCULAR | Status: AC | PRN
  Administered 2023-08-09: 40 mg via INTRA_ARTICULAR

## 2023-08-09 MED ORDER — LIDOCAINE HCL 1 % IJ SOLN
2.0000 mL | INTRAMUSCULAR | Status: AC | PRN
  Administered 2023-08-09: 2 mL

## 2023-08-09 NOTE — Telephone Encounter (Signed)
Called patient and advised about ice for 20 minutes 3x/day, as well as Tylenol. He would still like to be seen. Patient is scheduled for 1:00pm today 08/09/2023.

## 2023-08-09 NOTE — Progress Notes (Signed)
Kenneth Harper - 83 y.o. male MRN [id number]  Date of birth: 06/19/1941  Office Visit Note: Visit Date: 08/09/2023 PCP: Georgann Housekeeper, MD Referred by: Georgann Housekeeper, MD  Subjective: Chief Complaint  Patient presents with   Right Knee - Follow-up   Left Knee - Follow-up   HPI: Kenneth Harper is a pleasant 83 y.o. male who presents today for follow-up of bilateral knee arthritis, but acute swollen left knee.  He has advanced arthritis of both knees.  Back on 08/05/2023 he did have corticosteroid injections into the knees and had done well that day and the following day until Friday night but left knee started to become painful and swollen.  This progressed on Saturday.  He has been taking Tylenol without much relief.  This is making walking difficult.  He denies any fever or chills.  Pertinent ROS were reviewed with the patient and found to be negative unless otherwise specified above in HPI.   Assessment & Plan: Visit Diagnoses:  1. Effusion, left knee   2. Bilateral primary osteoarthritis of knee    Plan: Impression is acute left knee effusion in the setting of bilateral knee OA.  This did flare on him about 36 hours after knee injection.  Cannot rule out steroid flare versus inflammatory arthropathy.  Through shared decision-making, did proceed with ultrasound-guided aspiration and subsequent injection.  We did aspirate about 35 cc of a slightly cloudy, yellow fluid.  This was sent off for synovial fluid labs including cell count with differential, uric acid, crystal analysis and culture.  I will call him with results once these return.  In the interim, I would like him to alternate Tylenol/ibuprofen for the next few days and commit to icing the knee 3 times daily for the next 2 to 3 days.  Strict return precautions were provided.  Follow-up: Return if symptoms worsen or fail to improve, for will call/message with lab results.   Meds & Orders: No orders of the defined types were  placed in this encounter.   Orders Placed This Encounter  Procedures   Large Joint Inj   Anaerobic and Aerobic Culture   US Guided Needle Placement - No Linked Charges   Uric Acid, Synovial Fluid     Procedures: Large Joint Inj: L knee on 08/09/2023 5:26 PM Indications: joint swelling and pain Details: 18 G 1.5 in needle, ultrasound-guided superolateral approach Medications: 2 mL lidocaine 1 %; 2 mL bupivacaine 0.25 %; 40 mg methylPREDNISolone acetate 40 MG/ML Aspirate: 35 mL yellow; sent for lab analysis Outcome: tolerated well, no immediate complications  US-guided Knee Aspiration, Left: After discussion on risks/benefits/indications was provided, informed verbal consent was obtained and a timeout was performed, patient was lying supine on exam table. The knee was prepped with alcohol swab.  Utilizing superolateral approach and under ultrasound guidance, approximately 5 mL of lidocaine 1% was used for local anesthesia. Then using an 18g needle on 60cc syringe via an in-plane US guided approach, approximately 35 mL of slightly cloudy straw-colored fluid was aspirated from the knee. Utilizing the same portal, the knee joint was then injected with 2:2:1 lidocaine:bupivicaine:depomedrol.  Patient tolerated procedure well without immediate complications.  Procedure, treatment alternatives, risks and benefits explained, specific risks discussed. Consent was given by the patient. Immediately prior to procedure a time out was called to verify the correct patient, procedure, equipment, support staff and site/side marked as required. Patient was prepped and draped in the usual sterile fashion.  Clinical History: No specialty comments available.  He reports that he quit smoking about 12 years ago. His smoking use included cigars. He has never used smokeless tobacco. No results for input(s): "HGBA1C", "LABURIC" in the last 8760 hours.  Objective:    Physical Exam  Gen: Well-appearing,  in no acute distress; non-toxic CV: Well-perfused. Warm.  Resp: Breathing unlabored on room air; no wheezing. Psych: Fluid speech in conversation; appropriate affect; normal thought process  Ortho Exam -Bilateral knee: Right knee without effusion, left knee with a moderate effusion and some warmth to the knee, no redness.  There is varus deformity of both knees with notable bony bossing from arthritic change.  Imaging:    - POCUS: Moderate left knee effusion with hyperemia  Past Medical/Family/Surgical/Social History: Medications & Allergies reviewed per EMR, new medications updated. Patient Active Problem List   Diagnosis Date Noted   Essential hypertension 08/13/2020   Transaminitis    Steroid-induced hyperglycemia    Prediabetes    Contusion of right knee    Thalamic hemorrhage with stroke (HCC) 08/06/2020   Hemorrhagic stroke (HCC)    Memory loss    Dyslipidemia    Elevated blood pressure reading    AKI (acute kidney injury) (HCC)    Primary osteoarthritis of right knee    ICH (intracerebral hemorrhage) (HCC) 07/31/2020   Bilateral primary osteoarthritis of knee 08/10/2019   Carpal tunnel syndrome on both sides 07/28/2018   Lumbar herniated disc 10/01/2015   Herniation of lumbar intervertebral disc with radiculopathy 09/30/2015    Class: Acute   Past Medical History:  Diagnosis Date   Arthritis    knees and back   Asthma    childhood   Carpal tunnel syndrome    Cataract    Diverticulitis    GERD (gastroesophageal reflux disease)    Hypercholesterolemia    Memory loss    Stroke (HCC) 07/31/2020   Family History  Problem Relation Age of Onset   Parkinson's disease Mother    Heart failure Father    Diabetes Brother    Cancer Brother        throat   Hydrocephalus Brother    Diabetes Brother    Migraines Sister    Past Surgical History:  Procedure Laterality Date   BILATERAL CARPAL TUNNEL RELEASE     CATARACT EXTRACTION, BILATERAL Bilateral     COLONOSCOPY WITH PROPOFOL N/A 10/29/2014   Procedure: COLONOSCOPY WITH PROPOFOL;  Surgeon: Charolett Bumpers, MD;  Location: [facility];  Service: Endoscopy;  Laterality: N/A;   EYE SURGERY     LUMBAR LAMINECTOMY/DECOMPRESSION MICRODISCECTOMY N/A 10/01/2015   Procedure: LEFT L2-3 MICRODISCECTOMY;  Surgeon: Kerrin Champagne, MD;  Location: [facility];  Service: Orthopedics;  Laterality: N/A;   Social History   Occupational History   Not on file  Tobacco Use   Smoking status: Former    Types: Cigars    Quit date: 10/22/2010    Years since quitting: 12.8   Smokeless tobacco: Never  Vaping Use   Vaping status: Never Used  Substance and Sexual Activity   Alcohol use: No    Alcohol/week: 0.0 standard drinks of alcohol   Drug use: No   Sexual activity: Never    Birth control/protection: Abstinence

## 2023-08-09 NOTE — Addendum Note (Signed)
Addended by: Jamie Kato III on: 08/09/2023 05:36 PM   Modules accepted: Orders

## 2023-08-09 NOTE — Progress Notes (Signed)
Patient says that his knee injections were last Thursday, and then on Friday night his left knee was swollen and too painful to bear weight. He says that the right is not 100%, but is feeling a bit better after the injection. He is concerned that he overdid it on Friday during the day. He says he was unable to do anything on Saturday due to pain in the knee. He has taken Tylenol with no relief and has not tried ice.

## 2023-08-09 NOTE — Telephone Encounter (Signed)
Patient called. Says his knees are swollen. Would like to be worked in today.

## 2023-08-15 LAB — ANAEROBIC AND AEROBIC CULTURE
AER RESULT:: NO GROWTH
MICRO NUMBER:: 16002393
MICRO NUMBER:: 16002394
SPECIMEN QUALITY:: ADEQUATE
SPECIMEN QUALITY:: ADEQUATE

## 2023-08-15 LAB — CELL COUNT + DIFF, W/O CRYST-SYNVL FLD
Basophils, %: 0 %
Eosinophils-Synovial: 1 % (ref 0–2)
Lymphocytes-Synovial Fld: 5 % (ref 0–74)
Monocyte/Macrophage: 4 % (ref 0–69)
Neutrophil, Synovial: 90 % — ABNORMAL HIGH (ref 0–24)
Synoviocytes, %: 0 % (ref 0–15)
WBC, Synovial: 8657 {cells}/uL — ABNORMAL HIGH (ref <150)

## 2023-08-15 LAB — TEST AUTHORIZATION

## 2023-08-15 LAB — SYNOVIAL FLUID, CRYSTAL

## 2023-08-15 LAB — URIC ACID, SYNOVIAL FLUID: Uric Acid, Synovial Fluid: 5.2 mg/dL (ref <8.0)

## 2023-08-15 LAB — EXTRA SPECIMEN

## 2023-08-16 ENCOUNTER — Encounter: Payer: Self-pay | Admitting: Physician Assistant

## 2023-08-16 ENCOUNTER — Ambulatory Visit: Payer: Medicare HMO | Admitting: Physician Assistant

## 2023-08-16 ENCOUNTER — Ambulatory Visit (INDEPENDENT_AMBULATORY_CARE_PROVIDER_SITE_OTHER): Payer: Medicare HMO | Admitting: Physician Assistant

## 2023-08-16 DIAGNOSIS — M1712 Unilateral primary osteoarthritis, left knee: Secondary | ICD-10-CM

## 2023-08-16 MED ORDER — MELOXICAM 7.5 MG PO TABS: 7.5000 mg | ORAL_TABLET | Freq: Every day | ORAL | 0 refills | Status: DC

## 2023-08-16 MED ORDER — COLCHICINE 0.6 MG PO CAPS: 0.6000 mg | ORAL_CAPSULE | Freq: Every day | ORAL | 0 refills | Status: DC

## 2023-08-16 NOTE — Progress Notes (Signed)
Office Visit Note   Patient: Kenneth Harper           Date of Birth: Oct 08, 1940           MRN: [id number] Visit Date: 08/16/2023              Requested by: Georgann Housekeeper, MD [address] [place],  [place] [address] PCP: Georgann Housekeeper, MD  No chief complaint on file.     HPI: Kenneth Harper is a pleasant 83 year old gentleman with a long history of left knee pain.  He was followed by Dr. Cleophas Dunker and then Dr. Shon Baton.  He has gotten aspirations and steroid injections.  He did have a steroid injection with Dr. Shon Baton and a subsequent aspiration because he accumulated fluid approximately 35 cc about a week later.  This was sent to the lab.  Reviewed by Dr. Shon Baton findings consistent with pseudogout.  Assessment & Plan: Visit Diagnoses: Osteoarthritis left knee  Plan: Have spoken with Dr. Shon Baton would recommend a trial on a different anti-inflammatory we will have him stop Advil and Aleve and try him on colchicine 0.6 mg daily.  Will follow-up with me in 2 weeks  Follow-Up Instructions: No follow-ups on file.   Ortho Exam  Patient is alert, oriented, no adenopathy, well-dressed, normal affect, normal respiratory effort. Examination of his left knee he has no warmth no erythema mild effusion compartments are soft and compressible he is neurovascularly intact no cellulitis  Imaging: No results found. No images are attached to the encounter.  Labs: Lab Results  Component Value Date   HGBA1C 5.8 (H) 08/01/2020   REPTSTATUS 08/09/2020 FINAL 08/04/2020   REPTSTATUS 08/09/2020 FINAL 08/04/2020   CULT  08/04/2020    NO GROWTH 5 DAYS Performed at [facility], [address], [place], [place] [address]    CULT  08/04/2020    NO GROWTH 5 DAYS Performed at [facility], [address], [place], [place] [address]      Lab Results  Component Value Date   ALBUMIN 3.1 (L) 08/12/2020   ALBUMIN 2.8 (L) 08/09/2020   ALBUMIN 2.5 (L) 08/08/2020    No results  found for: "MG" No results found for: "VD25OH"  No results found for: "PREALBUMIN"    Latest Ref Rng & Units 08/12/2020    6:01 AM 08/07/2020    5:11 AM 08/02/2020    3:58 PM  CBC EXTENDED  WBC 4.0 - 10.5 K/uL 5.9  5.1  7.4   RBC 4.22 - 5.81 MIL/uL 4.53  4.44  4.93   Hemoglobin 13.0 - 17.0 g/dL 21.3  08.6  57.8   HCT 39.0 - 52.0 % 40.2  39.8  44.4   Platelets 150 - 400 K/uL 302  231  188   NEUT# 1.7 - 7.7 K/uL  2.8  4.9   Lymph# 0.7 - 4.0 K/uL  1.7  1.3      There is no height or weight on file to calculate BMI.  Orders:  No orders of the defined types were placed in this encounter.  Meds ordered this encounter  Medications   meloxicam (MOBIC) 7.5 MG tablet    Sig: Take 1 tablet (7.5 mg total) by mouth daily.    Dispense:  30 tablet    Refill:  0     Procedures: No procedures performed  Clinical Data: No additional findings.  ROS:  All other systems negative, except as noted in the HPI. Review of Systems  Objective: Vital Signs: There were no vitals taken for this visit.  Specialty Comments:  No specialty comments available.  PMFS History: Patient Active Problem List   Diagnosis Date Noted   Essential hypertension 08/13/2020   Transaminitis    Steroid-induced hyperglycemia    Prediabetes    Contusion of right knee    Thalamic hemorrhage with stroke (HCC) 08/06/2020   Hemorrhagic stroke (HCC)    Memory loss    Dyslipidemia    Elevated blood pressure reading    AKI (acute kidney injury) (HCC)    Primary osteoarthritis of right knee    ICH (intracerebral hemorrhage) (HCC) 07/31/2020   Bilateral primary osteoarthritis of knee 08/10/2019   Carpal tunnel syndrome on both sides 07/28/2018   Lumbar herniated disc 10/01/2015   Herniation of lumbar intervertebral disc with radiculopathy 09/30/2015    Class: Acute   Past Medical History:  Diagnosis Date   Arthritis    knees and back   Asthma    childhood   Carpal tunnel syndrome    Cataract     Diverticulitis    GERD (gastroesophageal reflux disease)    Hypercholesterolemia    Memory loss    Stroke (HCC) 07/31/2020    Family History  Problem Relation Age of Onset   Parkinson's disease Mother    Heart failure Father    Diabetes Brother    Cancer Brother        throat   Hydrocephalus Brother    Diabetes Brother    Migraines Sister     Past Surgical History:  Procedure Laterality Date   BILATERAL CARPAL TUNNEL RELEASE     CATARACT EXTRACTION, BILATERAL Bilateral    COLONOSCOPY WITH PROPOFOL N/A 10/29/2014   Procedure: COLONOSCOPY WITH PROPOFOL;  Surgeon: Charolett Bumpers, MD;  Location: [facility];  Service: Endoscopy;  Laterality: N/A;   EYE SURGERY     LUMBAR LAMINECTOMY/DECOMPRESSION MICRODISCECTOMY N/A 10/01/2015   Procedure: LEFT L2-3 MICRODISCECTOMY;  Surgeon: Kerrin Champagne, MD;  Location: [facility];  Service: Orthopedics;  Laterality: N/A;   Social History   Occupational History   Not on file  Tobacco Use   Smoking status: Former    Types: Cigars    Quit date: 10/22/2010    Years since quitting: 12.8   Smokeless tobacco: Never  Vaping Use   Vaping status: Never Used  Substance and Sexual Activity   Alcohol use: No    Alcohol/week: 0.0 standard drinks of alcohol   Drug use: No   Sexual activity: Never    Birth control/protection: Abstinence

## 2023-08-16 NOTE — Addendum Note (Signed)
Addended by: Jamie Kato III on: 08/16/2023 05:28 PM   Modules accepted: Level of Service

## 2023-08-16 NOTE — Progress Notes (Signed)
   Kenneth Harper - 83 y.o. male MRN [id number]  Date of birth: 1941-02-20   Physician Assistant Visit - Attending Physician Addendum:   I have independently interviewed and examined the patient myself. I have discussed the above with the original author and agree with their documentation. My edits for correction/addition/clarification have been made, see any changes above and below.   In summary, very pleasant 83 year old male with ongoing left knee pain and effusion.  Recent aspirate studies from my procedure on 08/09/2023 show signs of pseudogout.  He is able to tolerate NSAIDs.  Given the pseudogout, we will place him on a course of colchicine 0.6 mg daily scheduled for the next 2-3 weeks.  He will continue icing the knee.  He will see [place] persons in follow-up over the next few weeks.  If improving, he will continue this and taper off.  We did discuss given his severe bone-on-bone arthritis, if his pain and/or swelling continues, next step would likely be consideration of total knee arthroplasty. I discussed this in the room with the patient and with [place] persons, all agreeable.  Madelyn Brunner, DO Primary Care Sports Medicine Physician  [facility] [place] [facility]

## 2023-08-17 ENCOUNTER — Ambulatory Visit: Payer: Medicare HMO | Admitting: Physician Assistant

## 2023-08-19 ENCOUNTER — Ambulatory Visit: Payer: Medicare HMO | Admitting: Physician Assistant

## 2023-08-19 ENCOUNTER — Encounter: Payer: Self-pay | Admitting: Physician Assistant

## 2023-08-19 DIAGNOSIS — M1712 Unilateral primary osteoarthritis, left knee: Secondary | ICD-10-CM | POA: Diagnosis not present

## 2023-08-19 MED ORDER — LIDOCAINE HCL 1 % IJ SOLN
3.0000 mL | INTRAMUSCULAR | Status: AC | PRN
  Administered 2023-08-19: 3 mL

## 2023-08-19 NOTE — Progress Notes (Signed)
 Office Visit Note   Patient: Kenneth Harper           Date of Birth: October 26, 1940           MRN: [id number] Visit Date: 08/19/2023              Requested by: Ransom Other, MD [address] [place],  [place] [address] PCP: Ransom Other, MD  Left knee pain    HPI: Pleasant 83 year old gentleman with end-stage arthritis of his left knee.  He has had aspirations and steroid injections with Dr. Burnetta in the past.  Most recent 1 was 08/05/2023.  Since then he keeps having recurrent effusions.  Comes in today requesting aspiration no fever or chills  Assessment & Plan: Visit Diagnoses: Osteoarthritis bilateral knees  Plan: Went forward and aspirated 35 cc of yellow fluid from his left knee.  He felt much better.  Did not inject steroid.  He will make an appointment as per Dr. Burnetta direction with Dr. Vernetta to discuss possible knee replacement surgery  Follow-Up Instructions: No follow-ups on file.   Ortho Exam  Patient is alert, oriented, no adenopathy, well-dressed, normal affect, normal respiratory effort. Left knee no redness no erythema compartments are soft and nontender he does have a moderate effusion and grinding  Imaging: No results found. No images are attached to the encounter.  Labs: Lab Results  Component Value Date   HGBA1C 5.8 (H) 08/01/2020   REPTSTATUS 08/09/2020 FINAL 08/04/2020   REPTSTATUS 08/09/2020 FINAL 08/04/2020   CULT  08/04/2020    NO GROWTH 5 DAYS Performed at [facility], [address], [place], [place] [address]    CULT  08/04/2020    NO GROWTH 5 DAYS Performed at [facility], [address] [place], [place] [address]      Lab Results  Component Value Date   ALBUMIN 3.1 (L) 08/12/2020   ALBUMIN 2.8 (L) 08/09/2020   ALBUMIN 2.5 (L) 08/08/2020    No results found for: MG No results found for: VD25OH  No results found for: PREALBUMIN    Latest Ref Rng & Units 08/12/2020    6:01 AM 08/07/2020     5:11 AM 08/02/2020    3:58 PM  CBC EXTENDED  WBC 4.0 - 10.5 K/uL 5.9  5.1  7.4   RBC 4.22 - 5.81 MIL/uL 4.53  4.44  4.93   Hemoglobin 13.0 - 17.0 g/dL 86.1  87.1  85.3   HCT 39.0 - 52.0 % 40.2  39.8  44.4   Platelets 150 - 400 K/uL 302  231  188   NEUT# 1.7 - 7.7 K/uL  2.8  4.9   Lymph# 0.7 - 4.0 K/uL  1.7  1.3      There is no height or weight on file to calculate BMI.  Orders:  No orders of the defined types were placed in this encounter.  No orders of the defined types were placed in this encounter.    Procedures: Large Joint Inj: L knee on 08/19/2023 11:30 AM Indications: pain and diagnostic evaluation Details: 25 G 1.5 in needle  Arthrogram: No  Medications: 3 mL lidocaine  1 % Outcome: tolerated well, no immediate complications  After verbal consent was obtained 4 cc of lidocaine  plain were injected after prepping with alcohol and Betadine  into the superior lateral pouch of the left knee.  Once adequate analgesia was achieved 35 cc of yellow slightly cloudy fluid was aspirated.  Band-Aid and  compression wrap were applied patient felt much better and ambulated from the clinic Procedure, treatment alternatives, risks and benefits explained, specific risks discussed. Consent was given by the patient.    Clinical Data: No additional findings.  ROS:  All other systems negative, except as noted in the HPI. Review of Systems  Objective: Vital Signs: There were no vitals taken for this visit.  Specialty Comments:  No specialty comments available.  PMFS History: Patient Active Problem List   Diagnosis Date Noted  . Essential hypertension 08/13/2020  . Transaminitis   . Steroid-induced hyperglycemia   . Prediabetes   . Contusion of right knee   . Thalamic hemorrhage with stroke (HCC) 08/06/2020  . Hemorrhagic stroke (HCC)   . Memory loss   . Dyslipidemia   . Elevated blood pressure reading   . AKI (acute kidney injury) (HCC)   . Primary osteoarthritis of  right knee   . ICH (intracerebral hemorrhage) (HCC) 07/31/2020  . Bilateral primary osteoarthritis of knee 08/10/2019  . Carpal tunnel syndrome on both sides 07/28/2018  . Lumbar herniated disc 10/01/2015  . Herniation of lumbar intervertebral disc with radiculopathy 09/30/2015    Class: Acute   Past Medical History:  Diagnosis Date  . Arthritis    knees and back  . Asthma    childhood  . Carpal tunnel syndrome   . Cataract   . Diverticulitis   . GERD (gastroesophageal reflux disease)   . Hypercholesterolemia   . Memory loss   . Stroke [facility]) 07/31/2020    Family History  Problem Relation Age of Onset  . Parkinson's disease Mother   . Heart failure Father   . Diabetes Brother   . Cancer Brother        throat  . Hydrocephalus Brother   . Diabetes Brother   . Migraines Sister     Past Surgical History:  Procedure Laterality Date  . BILATERAL CARPAL TUNNEL RELEASE    . CATARACT EXTRACTION, BILATERAL Bilateral   . COLONOSCOPY WITH PROPOFOL  N/A 10/29/2014   Procedure: COLONOSCOPY WITH PROPOFOL ;  Surgeon: Gladis MARLA Louder, MD;  Location: [facility];  Service: Endoscopy;  Laterality: N/A;  . EYE SURGERY    . LUMBAR LAMINECTOMY/DECOMPRESSION MICRODISCECTOMY N/A 10/01/2015   Procedure: LEFT L2-3 MICRODISCECTOMY;  Surgeon: Lynwood FORBES Better, MD;  Location: [facility] OR;  Service: Orthopedics;  Laterality: N/A;   Social History   Occupational History  . Not on file  Tobacco Use  . Smoking status: Former    Types: Cigars    Quit date: 10/22/2010    Years since quitting: 12.8  . Smokeless tobacco: Never  Vaping Use  . Vaping status: Never Used  Substance and Sexual Activity  . Alcohol use: No    Alcohol/week: 0.0 standard drinks of alcohol  . Drug use: No  . Sexual activity: Never    Birth control/protection: Abstinence

## 2023-08-23 DIAGNOSIS — H6123 Impacted cerumen, bilateral: Secondary | ICD-10-CM | POA: Diagnosis not present

## 2023-08-23 DIAGNOSIS — H61303 Acquired stenosis of external ear canal, unspecified, bilateral: Secondary | ICD-10-CM | POA: Diagnosis not present

## 2023-08-30 ENCOUNTER — Ambulatory Visit (INDEPENDENT_AMBULATORY_CARE_PROVIDER_SITE_OTHER): Payer: Medicare HMO | Admitting: Physician Assistant

## 2023-08-30 ENCOUNTER — Encounter: Payer: Self-pay | Admitting: Physician Assistant

## 2023-08-30 DIAGNOSIS — M1712 Unilateral primary osteoarthritis, left knee: Secondary | ICD-10-CM

## 2023-08-30 NOTE — Progress Notes (Signed)
 Office Visit Note   Patient: Kenneth Harper           Date of Birth: [birth year]           MRN: [id number] Visit Date: 08/30/2023              Requested by: Georgann Housekeeper, MD [address] [place],  [place] [address] PCP: Georgann Housekeeper, MD  Left knee pain    HPI: Patient is a pleasant 83 year old gentleman who is a patient of Dr. Shon Baton.  He has had multiple aspirations of his left knee and has bone-on-bone degenerative changes.  I aspirated his knee 2 weeks ago.  He most recently had steroid would not be eligible for steroid until April.  He does have an appointment scheduled with Dr. Magnus Ivan for February 27 to discuss possible knee replacement..  He has had recurrent accumulation of some of the fluid back.  Wondering if this could be aspirated.  He says all the fluid came back 2 days after the previous aspirati  Assessment & Plan: Visit Diagnoses: Osteoarthritis left knee  Plan: End-stage arthritis left knee with multiple aspirations.  He is not due for another steroid injection.  Nor should I think he have 1 if he is considering knee replacement.  He only had 2 days of relief with the last aspiration.  I have counseled him that I would not recommend aspirating the knee again today.  Especially since he had such quick return of the fluid and he would not be able to receive a steroid.  Will follow-up with Dr. Magnus Ivan at the end of the month  Follow-Up Instructions: No follow-ups on file.   Ortho Exam  Patient is alert, oriented, no adenopathy, well-dressed, normal affect, normal respiratory effort. Examination of his left knee he is neurovascular intact no redness no erythema he has a mild to moderate effusion compartments are soft and compressible no cellulitis neurovascular intact  Imaging: No results found. No images are attached to the encounter.  Labs: Lab Results  Component Value Date   HGBA1C 5.8 (H) 08/01/2020   REPTSTATUS 08/09/2020 FINAL 08/04/2020    REPTSTATUS 08/09/2020 FINAL 08/04/2020   CULT  08/04/2020    NO GROWTH 5 DAYS Performed at [facility], [address], [place], [place] [address]    CULT  08/04/2020    NO GROWTH 5 DAYS Performed at [facility], [address], [place], [place] [address]      Lab Results  Component Value Date   ALBUMIN 3.1 (L) 08/12/2020   ALBUMIN 2.8 (L) 08/09/2020   ALBUMIN 2.5 (L) 08/08/2020    No results found for: "MG" No results found for: "VD25OH"  No results found for: "PREALBUMIN"    Latest Ref Rng & Units 08/12/2020    6:01 AM 08/07/2020    5:11 AM 08/02/2020    3:58 PM  CBC EXTENDED  WBC 4.0 - 10.5 K/uL 5.9  5.1  7.4   RBC 4.22 - 5.81 MIL/uL 4.53  4.44  4.93   Hemoglobin 13.0 - 17.0 g/dL 21.3  08.6  57.8   HCT 39.0 - 52.0 % 40.2  39.8  44.4   Platelets 150 - 400 K/uL 302  231  188   NEUT# 1.7 - 7.7 K/uL  2.8  4.9   Lymph# 0.7 - 4.0 K/uL  1.7  1.3      There is no height or weight on file to calculate BMI.  Orders:  No orders of the defined types were placed in this encounter.  No orders of the defined types were placed in this encounter.    Procedures: No procedures performed  Clinical Data: No additional findings.  ROS:  All other systems negative, except as noted in the HPI. Review of Systems  Objective: Vital Signs: There were no vitals taken for this visit.  Specialty Comments:  No specialty comments available.  PMFS History: Patient Active Problem List   Diagnosis Date Noted   Essential hypertension 08/13/2020   Transaminitis    Steroid-induced hyperglycemia    Prediabetes    Contusion of right knee    Thalamic hemorrhage with stroke (HCC) 08/06/2020   Hemorrhagic stroke (HCC)    Memory loss    Dyslipidemia    Elevated blood pressure reading    AKI (acute kidney injury) (HCC)    Primary osteoarthritis of right knee    ICH (intracerebral hemorrhage) (HCC) 07/31/2020   Bilateral primary osteoarthritis of knee 08/10/2019    Carpal tunnel syndrome on both sides 07/28/2018   Lumbar herniated disc 10/01/2015   Herniation of lumbar intervertebral disc with radiculopathy 09/30/2015    Class: Acute   Past Medical History:  Diagnosis Date   Arthritis    knees and back   Asthma    childhood   Carpal tunnel syndrome    Cataract    Diverticulitis    GERD (gastroesophageal reflux disease)    Hypercholesterolemia    Memory loss    Stroke (HCC) 07/31/2020    Family History  Problem Relation Age of Onset   Parkinson's disease Mother    Heart failure Father    Diabetes Brother    Cancer Brother        throat   Hydrocephalus Brother    Diabetes Brother    Migraines Sister     Past Surgical History:  Procedure Laterality Date   BILATERAL CARPAL TUNNEL RELEASE     CATARACT EXTRACTION, BILATERAL Bilateral    COLONOSCOPY WITH PROPOFOL N/A 10/29/2014   Procedure: COLONOSCOPY WITH PROPOFOL;  Surgeon: Charolett Bumpers, MD;  Location: [facility];  Service: Endoscopy;  Laterality: N/A;   EYE SURGERY     LUMBAR LAMINECTOMY/DECOMPRESSION MICRODISCECTOMY N/A 10/01/2015   Procedure: LEFT L2-3 MICRODISCECTOMY;  Surgeon: Kerrin Champagne, MD;  Location: [facility];  Service: Orthopedics;  Laterality: N/A;   Social History   Occupational History   Not on file  Tobacco Use   Smoking status: Former    Types: Cigars    Quit date: 10/22/2010    Years since quitting: 12.8   Smokeless tobacco: Never  Vaping Use   Vaping status: Never Used  Substance and Sexual Activity   Alcohol use: No    Alcohol/week: 0.0 standard drinks of alcohol   Drug use: No   Sexual activity: Never    Birth control/protection: Abstinence

## 2023-09-09 ENCOUNTER — Other Ambulatory Visit (INDEPENDENT_AMBULATORY_CARE_PROVIDER_SITE_OTHER): Payer: Self-pay

## 2023-09-09 ENCOUNTER — Other Ambulatory Visit: Payer: Self-pay | Admitting: Physician Assistant

## 2023-09-09 ENCOUNTER — Ambulatory Visit (INDEPENDENT_AMBULATORY_CARE_PROVIDER_SITE_OTHER): Payer: Medicare HMO | Admitting: Orthopaedic Surgery

## 2023-09-09 VITALS — Ht 70.0 in | Wt 205.0 lb

## 2023-09-09 DIAGNOSIS — M1712 Unilateral primary osteoarthritis, left knee: Secondary | ICD-10-CM | POA: Diagnosis not present

## 2023-09-09 NOTE — Progress Notes (Signed)
 The patient is a very pleasant 83 year old gentleman that I am seeing for the first time but has been a long established patient of the practice having seen Dr. Cleophas Dunker for many years as well as Dr. Shon Baton.  He comes in to discuss knee replacement surgery for his left knee.  He has had injections in his knees for many years now.  He says got a point where his left knee pain is daily and is definitely affecting his mobility, his quality life and his activities of daily living.  He does ambulate with a cane.  He is not obese.  He is not a diabetic.  I was able to review all of his past medical history and medications within epic.  At this point his left knee pain is 10 out of 10.  Injections have not helped at all anymore.  Examination of both knees shows significant varus malalignment of both knees with the left worse than the right.  He does have a moderate knee joint effusion with his left knee.  He has significant limitations in range of motion of that knee.  An AP and lateral left knee today shows complete loss of the entire joint space with varus malalignment that is significant.  There is no joint space remaining at all with large osteophytes as well.  There arthritis is end-stage and severe.  We had a long and thorough discussion about knee replacement surgery.  He knows this will be difficult given the deformity of his knee but I do feel it is going to help decrease his pain, improve his mobility and improve his quality of life.  We talked about the risks and benefits of surgery and went over his x-rays.  We discussed what to expect from an intraoperative and postoperative standpoint.  All questions and concerns were answered and addressed.  Will work on getting him scheduled for a left total knee arthroplasty in the near future.

## 2023-09-21 ENCOUNTER — Telehealth: Payer: Self-pay

## 2023-09-21 NOTE — Telephone Encounter (Signed)
 I called patient to discuss scheduling left TKA.  Left voice mail for return call.

## 2023-09-23 ENCOUNTER — Telehealth: Payer: Self-pay

## 2023-09-23 NOTE — Telephone Encounter (Signed)
 Returned patient's call to discuss scheduling surgery.  Left voice mail for return call.

## 2023-09-24 ENCOUNTER — Telehealth: Payer: Self-pay

## 2023-09-24 ENCOUNTER — Other Ambulatory Visit: Payer: Self-pay

## 2023-09-24 ENCOUNTER — Ambulatory Visit: Admitting: Sports Medicine

## 2023-09-24 ENCOUNTER — Other Ambulatory Visit (INDEPENDENT_AMBULATORY_CARE_PROVIDER_SITE_OTHER): Payer: Self-pay

## 2023-09-24 ENCOUNTER — Encounter: Payer: Self-pay | Admitting: Sports Medicine

## 2023-09-24 DIAGNOSIS — G8929 Other chronic pain: Secondary | ICD-10-CM | POA: Diagnosis not present

## 2023-09-24 DIAGNOSIS — M25562 Pain in left knee: Secondary | ICD-10-CM

## 2023-09-24 DIAGNOSIS — M170 Bilateral primary osteoarthritis of knee: Secondary | ICD-10-CM

## 2023-09-24 DIAGNOSIS — M11262 Other chondrocalcinosis, left knee: Secondary | ICD-10-CM | POA: Diagnosis not present

## 2023-09-24 DIAGNOSIS — M1712 Unilateral primary osteoarthritis, left knee: Secondary | ICD-10-CM | POA: Diagnosis not present

## 2023-09-24 DIAGNOSIS — M25462 Effusion, left knee: Secondary | ICD-10-CM | POA: Diagnosis not present

## 2023-09-24 MED ORDER — LIDOCAINE HCL 1 % IJ SOLN
4.0000 mL | INTRAMUSCULAR | Status: AC | PRN
  Administered 2023-09-24: 4 mL

## 2023-09-24 MED ORDER — TRAMADOL HCL 50 MG PO TABS: 50.0000 mg | ORAL_TABLET | Freq: Four times a day (QID) | ORAL | 0 refills | Status: DC | PRN

## 2023-09-24 NOTE — Telephone Encounter (Signed)
 Per Dr. Shon Baton request, I called patient again today to discuss scheduling surgery.  Left voice mail message for return call.

## 2023-09-24 NOTE — Progress Notes (Signed)
 Kenneth Harper - 83 y.o. male MRN [id number]  Date of birth: 01/07/1941  Office Visit Note: Visit Date: 09/24/2023 PCP: Georgann Housekeeper, MD Referred by: Georgann Housekeeper, MD  Subjective: Chief Complaint  Patient presents with   Left Knee - Pain   HPI: Kenneth Harper is a pleasant 83 y.o. male who presents today for acute on chronic left knee pain with exacerbation and associated effusion.  I have known Kenneth Harper for the last few years and he does have advanced arthritis of bilateral knees with the left knee being severe bone-on-bone with varus malalignment.  In the past he has gotten good relief from corticosteroid injections every 3 to 6 months, but here recently over the last few months his knee pain has been rather debilitating and he has recurrent effusion.  I did aspirate his knee at the end of January and he had associated pseudogout with crystal arthropathy within the joint.  He did meet with Dr. Magnus Ivan in February and is planning to move forward with a left knee arthroplasty but is working on scheduling currently.  Here over the last few weeks he has had an increase in his knee pain with an exacerbation and feels the knee is very swollen for him.  He uses infrequent ibuprofen or over-the-counter anti-inflammatories as needed but is asking about pain relief prior to having his surgery.  He does ambulate with a cane and uses compression sleeves.  Pertinent ROS were reviewed with the patient and found to be negative unless otherwise specified above in HPI.   Assessment & Plan: Visit Diagnoses:  1. Bilateral primary osteoarthritis of knee   2. Chronic pain of left knee   3. Effusion, left knee   4. Pseudogout of left knee    Plan: Impression is chronic bilateral knee osteoarthritis with left knee severe osteoarthritis and recurrent effusion.  He is dealing with an acute exacerbation of his chronic left knee pain and under ultrasound visualization he does have a large effusion present  today with chondrocalcinosis present within the joint.  Previous lab review shows a history of pseudogout as well.  Given his flare, through shared decision making we did proceed with ultrasound-guided knee aspiration which yielded about 70 cc's of clear, yellow fluid (likely pseudogout effusion).  There was NO corticosteroid administered into the knee joint, given that he likely may proceed with knee replacement in the coming months.  I did give our card for our surgical scheduler for him to contact and schedule this at his leisure.  For his severe pain, I am okay with writing prescription of tramadol 50 mg to use only as needed for breakthrough pain given that he should avoid chronic NSAIDs from his history of stroke.  He is agreeable and understanding to this.  Just continuing his knee compression and applying ice for 15-20 minutes 2-3 times daily to help prevent his effusion reaccumulation.  Follow-up: Return for will setup L-knee TKA with Magnus Ivan [facility])  .   Meds & Orders:  Meds ordered this encounter  Medications   traMADol (ULTRAM) 50 MG tablet    Sig: Take 1 tablet (50 mg total) by mouth every 6 (six) hours as needed for severe pain (pain score 7-10).    Dispense:  30 tablet    Refill:  0    Orders Placed This Encounter  Procedures   Large Joint Inj   [place] Extrem Low Left Ltd   US Guided Needle Placement - No Linked Charges  Procedures: Large Joint Inj: L knee on 09/24/2023 2:15 PM Indications: pain and joint swelling Details: 18 G 1.5 in needle, ultrasound-guided superolateral approach Medications: 4 mL lidocaine 1 % Aspirate: clear and yellow Outcome: tolerated well, no immediate complications  US-guided Knee Aspiration, left: After discussion on risks/benefits/indications was provided, informed verbal consent was obtained and a timeout was performed, patient was lying supine on exam table. The knee was prepped with alcohol swab. Utilizing superolateral approach,  approximately 4 mL of lidocaine 1% was used for local anesthesia. Then using an 18g needle on (2) 35cc syringe, 69 mL of clear straw-colored fluid was aspirated from the knee. There was NO corticosteroid administered due to the fact that he may have surgery. 1cc of toradol was provided for pain control.  Patient tolerated procedure well without immediate complications.  Procedure, treatment alternatives, risks and benefits explained, specific risks discussed. Consent was given by the patient. Immediately prior to procedure a time out was called to verify the correct patient, procedure, equipment, support staff and site/side marked as required. Patient was prepped and draped in the usual sterile fashion.          Clinical History: No specialty comments available.  He reports that he quit smoking about 12 years ago. His smoking use included cigars. He has never used smokeless tobacco. No results for input(s): "HGBA1C", "LABURIC" in the last 8760 hours.  Labs from aspiration 08/09/23 (*I personally reviewed them today)  Component Ref Range & Units (hover) 1 mo ago (08/09/23) 1 mo ago (08/09/23)  Site LEFT KNEE LEFT KNEE  Crystals, Fluid    Comment: . Positive for calcium pyrophosphate crystals . Intracellular  Resulting Agency [facility]         Resulting Agency's Comment  Performing Organization Information:     Site IDGweneth Fritter (CLIA: [id number])     Name: Tawni Millers     Address: [address] [place], [place] [address]     Director: Marica Otter MD  Specimen Collected: 08/09/23 16:22 Last Resulted: 08/15/23 11:04        Component Ref Range & Units (hover) 1 mo ago (08/09/23) 1 mo ago (08/09/23)  Site LEFT KNEE LEFT KNEE  Color, Synovial LIGHT YELLOW Abnormal    Appearance-Synovial CLOUDY Abnormal    WBC, Synovial 8,657 High    Neutrophil, Synovial 90 High    Lymphocytes-Synovial Fld 5    Monocyte/Macrophage 4   Eosinophils-Synovial 1   Basophils, % 0   Synoviocytes, % 0   Resulting Agency [facility] [place] [facility] [place]        Objective:    Physical Exam  Gen: Well-appearing, in no acute distress; non-toxic CV: Well-perfused. Warm.  Resp: Breathing unlabored on room air; no wheezing. Psych: Fluid speech in conversation; appropriate affect; normal thought process  Ortho Exam - Left knee: There is a large effusion on the knee with some warmth, no underlying redness or warmth.  There is limited flexion and extension, range of motion from about 5-115 degrees.  Significant varus malalignment of the left knee greater than right knee.  Imaging: [place] Extrem Low Left Ltd Result Date: 09/24/2023 Limited musculoskeletal ultrasound of the left knee was performed today.  There is a large effusion about the knee joint with mild hyperemia.  The suprapatellar pouch was seen with notable hypoechoic fluid.  There are scattered calcifications that are hyperechoic within the effusion, likely suggestive of chondrocalcinosis.  There is a small spur on the superior aspect  of the patella but otherwise no cortical regularity.  Patellar tendon and overlying quadricep tendon were visualized without tearing.   Previous knee XR 04/2022 and 08/05/2023:  Three-view radiographs of his left knee were reviewed today.  He has  tricompartmental advanced arthritic changes with bone-on-bone arthritis in  all 3 compartments he does have varus alignment.  He has subchondral  sclerosis.  There are no evidence of any acute fractures   Past Medical/Family/Surgical/Social History: Medications & Allergies reviewed per EMR, new medications updated. Patient Active Problem List   Diagnosis Date Noted   Essential hypertension 08/13/2020   Transaminitis    Steroid-induced hyperglycemia    Prediabetes    Contusion of right knee    Thalamic hemorrhage with stroke (HCC) 08/06/2020    Hemorrhagic stroke (HCC)    Memory loss    Dyslipidemia    Elevated blood pressure reading    AKI (acute kidney injury) (HCC)    Primary osteoarthritis of right knee    ICH (intracerebral hemorrhage) (HCC) 07/31/2020   Bilateral primary osteoarthritis of knee 08/10/2019   Carpal tunnel syndrome on both sides 07/28/2018   Lumbar herniated disc 10/01/2015   Herniation of lumbar intervertebral disc with radiculopathy 09/30/2015    Class: Acute   Past Medical History:  Diagnosis Date   Arthritis    knees and back   Asthma    childhood   Carpal tunnel syndrome    Cataract    Diverticulitis    GERD (gastroesophageal reflux disease)    Hypercholesterolemia    Memory loss    Stroke (HCC) 07/31/2020   Family History  Problem Relation Age of Onset   Parkinson's disease Mother    Heart failure Father    Diabetes Brother    Cancer Brother        throat   Hydrocephalus Brother    Diabetes Brother    Migraines Sister    Past Surgical History:  Procedure Laterality Date   BILATERAL CARPAL TUNNEL RELEASE     CATARACT EXTRACTION, BILATERAL Bilateral    COLONOSCOPY WITH PROPOFOL N/A 10/29/2014   Procedure: COLONOSCOPY WITH PROPOFOL;  Surgeon: Charolett Bumpers, MD;  Location: [facility];  Service: Endoscopy;  Laterality: N/A;   EYE SURGERY     LUMBAR LAMINECTOMY/DECOMPRESSION MICRODISCECTOMY N/A 10/01/2015   Procedure: LEFT L2-3 MICRODISCECTOMY;  Surgeon: Kerrin Champagne, MD;  Location: [facility] OR;  Service: Orthopedics;  Laterality: N/A;   Social History   Occupational History   Not on file  Tobacco Use   Smoking status: Former    Types: Cigars    Quit date: 10/22/2010    Years since quitting: 12.9   Smokeless tobacco: Never  Vaping Use   Vaping status: Never Used  Substance and Sexual Activity   Alcohol use: No    Alcohol/week: 0.0 standard drinks of alcohol   Drug use: No   Sexual activity: Never    Birth control/protection: Abstinence

## 2023-09-24 NOTE — Progress Notes (Signed)
 Patient says that over the last couple of weeks he has had pain and grinding in the left knee. He is unsure if it is more fluid, or just from the arthritis. He is still taking OTC medications and wearing his knee sleeves.

## 2023-09-29 DIAGNOSIS — G3184 Mild cognitive impairment, so stated: Secondary | ICD-10-CM | POA: Diagnosis not present

## 2023-09-29 DIAGNOSIS — Z8673 Personal history of transient ischemic attack (TIA), and cerebral infarction without residual deficits: Secondary | ICD-10-CM | POA: Diagnosis not present

## 2023-09-29 DIAGNOSIS — M179 Osteoarthritis of knee, unspecified: Secondary | ICD-10-CM | POA: Diagnosis not present

## 2023-09-29 DIAGNOSIS — N182 Chronic kidney disease, stage 2 (mild): Secondary | ICD-10-CM | POA: Diagnosis not present

## 2023-09-29 DIAGNOSIS — I10 Essential (primary) hypertension: Secondary | ICD-10-CM | POA: Diagnosis not present

## 2023-09-29 DIAGNOSIS — R7303 Prediabetes: Secondary | ICD-10-CM | POA: Diagnosis not present

## 2023-09-29 DIAGNOSIS — F331 Major depressive disorder, recurrent, moderate: Secondary | ICD-10-CM | POA: Diagnosis not present

## 2023-09-29 DIAGNOSIS — E7800 Pure hypercholesterolemia, unspecified: Secondary | ICD-10-CM | POA: Diagnosis not present

## 2023-10-13 ENCOUNTER — Other Ambulatory Visit: Payer: Self-pay | Admitting: Physician Assistant

## 2023-10-14 NOTE — Patient Instructions (Signed)
 DUE TO COVID-19 ONLY TWO VISITORS  (aged 82 and older)  ARE ALLOWED TO COME WITH YOU AND STAY IN THE WAITING ROOM ONLY DURING PRE OP AND PROCEDURE.   **NO VISITORS ARE ALLOWED IN THE SHORT STAY AREA OR RECOVERY ROOM!!**  IF YOU WILL BE ADMITTED INTO THE HOSPITAL YOU ARE ALLOWED ONLY FOUR SUPPORT PEOPLE DURING VISITATION HOURS ONLY (7 AM -8PM)   The support person(s) must pass our screening, gel in and out, and wear a mask at all times, including in the patient's room. Patients must also wear a mask when staff or their support person are in the room. Visitors GUEST BADGE MUST BE WORN VISIBLY  One adult visitor may remain with you overnight and MUST be in the room by 8 P.M.     Your procedure is scheduled on: 10/21/23   Report to [facility] Main Entrance    Report to admitting at : 5:15 AM   Call this number if you have problems the morning of surgery [phone]   Do not eat food :After Midnight.   After Midnight you may have the following liquids until : 4:30 AM DAY OF SURGERY  Water Black Coffee (sugar ok, NO MILK/CREAM OR CREAMERS)  Tea (sugar ok, NO MILK/CREAM OR CREAMERS) regular and decaf                             Plain Jell-O (NO RED)                                           Fruit ices (not with fruit pulp, NO RED)                                     Popsicles (NO RED)                                                                  Juice: apple, WHITE grape, WHITE cranberry Sports drinks like Gatorade (NO RED)   The day of surgery:  Drink ONE (1) Pre-Surgery Clear Ensure at : 4:30 AM the morning of surgery. Drink in one sitting. Do not sip.  This drink was given to you during your hospital  pre-op appointment visit. Nothing else to drink after completing the  Pre-Surgery Clear Ensure or G2.          If you have questions, please contact your surgeon's office.  FOLLOW ANY ADDITIONAL PRE OP INSTRUCTIONS YOU RECEIVED FROM YOUR SURGEON'S OFFICE!!!   Oral  Hygiene is also important to reduce your risk of infection.                                    Remember - BRUSH YOUR TEETH THE MORNING OF SURGERY WITH YOUR REGULAR TOOTHPASTE  DENTURES WILL BE REMOVED PRIOR TO SURGERY PLEASE DO NOT APPLY "Poly grip" OR ADHESIVES!!!   Do NOT smoke after Midnight   Take these medicines the morning of surgery  with A SIP OF WATER: bupropion(Wellbutrin),amlodipine,colchicine.Tylenol as needed.  STOP TAKING all Vitamins, Herbs and supplements 1 week before your surgery.   Bring CPAP mask and tubing day of surgery.                              You may not have any metal on your body including hair pins, jewelry, and body piercing             Do not wear lotions, powders, perfumes/cologne, or deodorant              Men may shave face and neck.   Do not bring valuables to the hospital. [facility] IS NOT             RESPONSIBLE   FOR VALUABLES.   Contacts, glasses, or bridgework may not be worn into surgery.   Bring small overnight bag day of surgery.   DO NOT BRING YOUR HOME MEDICATIONS TO THE HOSPITAL. PHARMACY WILL DISPENSE MEDICATIONS LISTED ON YOUR MEDICATION LIST TO YOU DURING YOUR ADMISSION IN THE HOSPITAL!    Patients discharged on the day of surgery will not be allowed to drive home.  Someone NEEDS to stay with you for the first 24 hours after anesthesia.   Special Instructions: Bring a copy of your healthcare power of attorney and living will documents         the day of surgery if you haven't scanned them before.              Please read over the following fact sheets you were given: IF YOU HAVE QUESTIONS ABOUT YOUR PRE-OP INSTRUCTIONS PLEASE CALL [phone]      Pre-operative 5 CHG Bath Instructions   You can play a key role in reducing the risk of infection after surgery. Your skin needs to be as free of germs as possible. You can reduce the number of germs on your skin by washing with CHG (chlorhexidine gluconate) soap before surgery.  CHG is an antiseptic soap that kills germs and continues to kill germs even after washing.   DO NOT use if you have an allergy to chlorhexidine/CHG or antibacterial soaps. If your skin becomes reddened or irritated, stop using the CHG and notify one of our RNs at [phone].   Please shower with the CHG soap starting 4 days before surgery using the following schedule:     Please keep in mind the following:  DO NOT shave, including legs and underarms, starting the day of your first shower.   You may shave your face at any point before/day of surgery.  Place clean sheets on your bed the day you start using CHG soap. Use a clean washcloth (not used since being washed) for each shower. DO NOT sleep with pets once you start using the CHG.   CHG Shower Instructions:  If you choose to wash your hair and private area, wash first with your normal shampoo/soap.  After you use shampoo/soap, rinse your hair and body thoroughly to remove shampoo/soap residue.  Turn the water OFF and apply about 3 tablespoons (45 ml) of CHG soap to a CLEAN washcloth.  Apply CHG soap ONLY FROM YOUR NECK DOWN TO YOUR TOES (washing for 3-5 minutes)  DO NOT use CHG soap on face, private areas, open wounds, or sores.  Pay special attention to the area where your surgery is being performed.  If you are having back surgery, having  someone wash your back for you may be helpful. Wait 2 minutes after CHG soap is applied, then you may rinse off the CHG soap.  Pat dry with a clean towel  Put on clean clothes/pajamas   If you choose to wear lotion, please use ONLY the CHG-compatible lotions on the back of this paper.     Additional instructions for the day of surgery: DO NOT APPLY any lotions, deodorants, cologne, or perfumes.   Put on clean/comfortable clothes.  Brush your teeth.  Ask your nurse before applying any prescription medications to the skin.   CHG Compatible Lotions   Aveeno Moisturizing lotion  Cetaphil  Moisturizing Cream  Cetaphil Moisturizing Lotion  Clairol Herbal Essence Moisturizing Lotion, Dry Skin  Clairol Herbal Essence Moisturizing Lotion, Extra Dry Skin  Clairol Herbal Essence Moisturizing Lotion, Normal Skin  Curel Age Defying Therapeutic Moisturizing Lotion with Alpha Hydroxy  Curel Extreme Care Body Lotion  Curel Soothing Hands Moisturizing Hand Lotion  Curel Therapeutic Moisturizing Cream, Fragrance-Free  Curel Therapeutic Moisturizing Lotion, Fragrance-Free  Curel Therapeutic Moisturizing Lotion, Original Formula  Eucerin Daily Replenishing Lotion  Eucerin Dry Skin Therapy Plus Alpha Hydroxy Crme  Eucerin Dry Skin Therapy Plus Alpha Hydroxy Lotion  Eucerin Original Crme  Eucerin Original Lotion  Eucerin Plus Crme Eucerin Plus Lotion  Eucerin TriLipid Replenishing Lotion  Keri Anti-Bacterial Hand Lotion  Keri Deep Conditioning Original Lotion Dry Skin Formula Softly Scented  Keri Deep Conditioning Original Lotion, Fragrance Free Sensitive Skin Formula  Keri Lotion Fast Absorbing Fragrance Free Sensitive Skin Formula  Keri Lotion Fast Absorbing Softly Scented Dry Skin Formula  Keri Original Lotion  Keri Skin Renewal Lotion Keri Silky Smooth Lotion  Keri Silky Smooth Sensitive Skin Lotion  Nivea Body Creamy Conditioning Oil  Nivea Body Extra Enriched Lotion  Nivea Body Original Lotion  Nivea Body Sheer Moisturizing Lotion Nivea Crme  Nivea Skin Firming Lotion  NutraDerm 30 Skin Lotion  NutraDerm Skin Lotion  NutraDerm Therapeutic Skin Cream  NutraDerm Therapeutic Skin Lotion  ProShield Protective Hand Cream  Provon moisturizing lotion   Incentive Spirometer  An incentive spirometer is a tool that can help keep your lungs clear and active. This tool measures how well you are filling your lungs with each breath. Taking long deep breaths may help reverse or decrease the chance of developing breathing (pulmonary) problems (especially infection) following: A long  period of time when you are unable to move or be active. BEFORE THE PROCEDURE  If the spirometer includes an indicator to show your best effort, your nurse or respiratory therapist will set it to a desired goal. If possible, sit up straight or lean slightly forward. Try not to slouch. Hold the incentive spirometer in an upright position. INSTRUCTIONS FOR USE  Sit on the edge of your bed if possible, or sit up as far as you can in bed or on a chair. Hold the incentive spirometer in an upright position. Breathe out normally. Place the mouthpiece in your mouth and seal your lips tightly around it. Breathe in slowly and as deeply as possible, raising the piston or the ball toward the top of the column. Hold your breath for 3-5 seconds or for as long as possible. Allow the piston or ball to fall to the bottom of the column. Remove the mouthpiece from your mouth and breathe out normally. Rest for a few seconds and repeat Steps 1 through 7 at least 10 times every 1-2 hours when you are awake. Take your time and  take a few normal breaths between deep breaths. The spirometer may include an indicator to show your best effort. Use the indicator as a goal to work toward during each repetition. After each set of 10 deep breaths, practice coughing to be sure your lungs are clear. If you have an incision (the cut made at the time of surgery), support your incision when coughing by placing a pillow or rolled up towels firmly against it. Once you are able to get out of bed, walk around indoors and cough well. You may stop using the incentive spirometer when instructed by your caregiver.  RISKS AND COMPLICATIONS Take your time so you do not get dizzy or light-headed. If you are in pain, you may need to take or ask for pain medication before doing incentive spirometry. It is harder to take a deep breath if you are having pain. AFTER USE Rest and breathe slowly and easily. It can be helpful to keep track of a log  of your progress. Your caregiver can provide you with a simple table to help with this. If you are using the spirometer at home, follow these instructions: SEEK MEDICAL CARE IF:  You are having difficultly using the spirometer. You have trouble using the spirometer as often as instructed. Your pain medication is not giving enough relief while using the spirometer. You develop fever of 100.5 F (38.1 C) or higher. SEEK IMMEDIATE MEDICAL CARE IF:  You cough up bloody sputum that had not been present before. You develop fever of 102 F (38.9 C) or greater. You develop worsening pain at or near the incision site. MAKE SURE YOU:  Understand these instructions. Will watch your condition. Will get help right away if you are not doing well or get worse. Document Released: 11/09/2006 Document Revised: 09/21/2011 Document Reviewed: 01/10/2007 [facility] Patient Information 2014 [place], [place].   ________________________________________________________________________

## 2023-10-15 ENCOUNTER — Encounter (HOSPITAL_COMMUNITY): Payer: Self-pay

## 2023-10-15 ENCOUNTER — Encounter (HOSPITAL_COMMUNITY)
Admission: RE | Admit: 2023-10-15 | Discharge: 2023-10-15 | Disposition: A | Discharge status: Home / Self Care | Source: Ambulatory Visit | Attending: Orthopaedic Surgery | Admitting: Orthopaedic Surgery

## 2023-10-15 ENCOUNTER — Other Ambulatory Visit: Payer: Self-pay

## 2023-10-15 VITALS — BP 139/88 | HR 93 | Temp 97.7°F | Ht 70.0 in | Wt 190.0 lb

## 2023-10-15 DIAGNOSIS — M1712 Unilateral primary osteoarthritis, left knee: Secondary | ICD-10-CM | POA: Diagnosis not present

## 2023-10-15 DIAGNOSIS — Z01818 Encounter for other preprocedural examination: Secondary | ICD-10-CM | POA: Diagnosis not present

## 2023-10-15 DIAGNOSIS — I10 Essential (primary) hypertension: Secondary | ICD-10-CM | POA: Insufficient documentation

## 2023-10-15 LAB — CBC
HCT: 42 % (ref 39.0–52.0)
Hemoglobin: 12.7 g/dL — ABNORMAL LOW (ref 13.0–17.0)
MCH: 27 pg (ref 26.0–34.0)
MCHC: 30.2 g/dL (ref 30.0–36.0)
MCV: 89.4 fL (ref 80.0–100.0)
Platelets: 266 10*3/uL (ref 150–400)
RBC: 4.7 MIL/uL (ref 4.22–5.81)
RDW: 15.8 % — ABNORMAL HIGH (ref 11.5–15.5)
WBC: 5.5 10*3/uL (ref 4.0–10.5)
nRBC: 0 % (ref 0.0–0.2)

## 2023-10-15 LAB — BASIC METABOLIC PANEL WITH GFR
Anion gap: 10 (ref 5–15)
BUN: 14 mg/dL (ref 8–23)
CO2: 24 mmol/L (ref 22–32)
Calcium: 9.3 mg/dL (ref 8.9–10.3)
Chloride: 104 mmol/L (ref 98–111)
Creatinine, Ser: 1.17 mg/dL (ref 0.61–1.24)
GFR, Estimated: >60 mL/min (ref >60)
Glucose, Bld: 104 mg/dL — ABNORMAL HIGH (ref 70–99)
Potassium: 3.4 mmol/L — ABNORMAL LOW (ref 3.5–5.1)
Sodium: 138 mmol/L (ref 135–145)

## 2023-10-15 LAB — SURGICAL PCR SCREEN
MRSA, PCR: NEGATIVE
Staphylococcus aureus: POSITIVE — AB

## 2023-10-15 NOTE — Progress Notes (Signed)
 For Anesthesia: PCP - Georgann Housekeeper, MD  Cardiologist - N/A  Bowel Prep reminder:  Chest x-ray -  EKG - 10/15/23 Stress Test -  ECHO - 08/02/20 Cardiac Cath -  Pacemaker/ICD device last checked: Pacemaker orders received: Device Rep notified:  Spinal Cord Stimulator:N/A  Sleep Study - N/A CPAP -   Fasting Blood Sugar - N/A Checks Blood Sugar _____ times a day Date and result of last Hgb A1c-  Last dose of GLP1 agonist- N/A GLP1 instructions:   Last dose of SGLT-2 inhibitors- N/A SGLT-2 instructions:   Blood Thinner Instructions:N/A Aspirin Instructions: Last Dose:  Activity level: Can go up a flight of stairs and activities of daily living without stopping and without chest pain and/or shortness of breath   Able to exercise without chest pain and/or shortness of breath    Anesthesia review: Hx: Stroke  Patient denies shortness of breath, fever, cough and chest pain at PAT appointment   Patient verbalized understanding of instructions that were given to them at the PAT appointment. Patient was also instructed that they will need to review over the PAT instructions again at home before surgery.

## 2023-10-15 NOTE — Progress Notes (Signed)
PCR: + STAPH °

## 2023-10-19 ENCOUNTER — Ambulatory Visit: Admitting: Sports Medicine

## 2023-10-20 ENCOUNTER — Telehealth: Payer: Self-pay | Admitting: *Deleted

## 2023-10-20 DIAGNOSIS — M1712 Unilateral primary osteoarthritis, left knee: Secondary | ICD-10-CM | POA: Insufficient documentation

## 2023-10-20 NOTE — Telephone Encounter (Signed)
 Pre-op RNCM call to patient.

## 2023-10-20 NOTE — Care Plan (Signed)
 [facility] RNCM call to patient to discuss his upcoming Left total knee arthroplasty with Dr. Magnus Ivan at [facility] on 10/21/23. He is agreeable to case management. He lives with his wife, who will be assisting him at home. Plan is to return to his home after discharge. He has a RW already. Anticipate HHPT will be needed after a short hospital stay. Referral made to [facility] after choice provided. Reviewed all post op care instructions and questions answered. Will continue to follow for needs.

## 2023-10-20 NOTE — H&P (Signed)
 TOTAL KNEE ADMISSION H&P  Patient is being admitted for left total knee arthroplasty.  Subjective:  Chief Complaint:left knee pain.  HPI: Kenneth Harper, 83 y.o. male, has a history of pain and functional disability in the left knee due to arthritis and has failed non-surgical conservative treatments for greater than 12 weeks to includeNSAID's and/or analgesics, corticosteriod injections, viscosupplementation injections, flexibility and strengthening excercises, use of assistive devices, and activity modification.  Onset of symptoms was gradual, starting many years ago with gradually worsening course since that time. The patient noted no past surgery on the left knee(s).  Patient currently rates pain in the left knee(s) at 10 out of 10 with activity. Patient has night pain, worsening of pain with activity and weight bearing, pain that interferes with activities of daily living, pain with passive range of motion, crepitus, and joint swelling.  Patient has evidence of subchondral sclerosis, periarticular osteophytes, and joint space narrowing by imaging studies. There is no active infection.  Patient Active Problem List   Diagnosis Date Noted   Unilateral primary osteoarthritis, left knee 10/20/2023   Essential hypertension 08/13/2020   Transaminitis    Steroid-induced hyperglycemia    Prediabetes    Contusion of right knee    Thalamic hemorrhage with stroke (HCC) 08/06/2020   Hemorrhagic stroke (HCC)    Memory loss    Dyslipidemia    Elevated blood pressure reading    AKI (acute kidney injury) (HCC)    Primary osteoarthritis of right knee    ICH (intracerebral hemorrhage) (HCC) 07/31/2020   Bilateral primary osteoarthritis of knee 08/10/2019   Carpal tunnel syndrome on both sides 07/28/2018   Lumbar herniated disc 10/01/2015   Herniation of lumbar intervertebral disc with radiculopathy 09/30/2015   Past Medical History:  Diagnosis Date   Arthritis    knees and back   Asthma     childhood   Carpal tunnel syndrome    Cataract    Diverticulitis    GERD (gastroesophageal reflux disease)    Hypercholesterolemia    Memory loss    Stroke (HCC) 07/31/2020    Past Surgical History:  Procedure Laterality Date   BILATERAL CARPAL TUNNEL RELEASE     CATARACT EXTRACTION, BILATERAL Bilateral    COLONOSCOPY WITH PROPOFOL N/A 10/29/2014   Procedure: COLONOSCOPY WITH PROPOFOL;  Surgeon: Charolett Bumpers, MD;  Location: [facility];  Service: Endoscopy;  Laterality: N/A;   EYE SURGERY     LUMBAR LAMINECTOMY/DECOMPRESSION MICRODISCECTOMY N/A 10/01/2015   Procedure: LEFT L2-3 MICRODISCECTOMY;  Surgeon: Kerrin Champagne, MD;  Location: [facility] OR;  Service: Orthopedics;  Laterality: N/A;    No current facility-administered medications for this encounter.   Current Outpatient Medications  Medication Sig Dispense Refill Last Dose/Taking   acetaminophen (TYLENOL) 325 MG tablet Take 1-2 tablets (325-650 mg total) by mouth every 4 (four) hours as needed for mild pain.   Taking As Needed   amLODipine (NORVASC) 5 MG tablet Take 1 tablet (5 mg total) by mouth daily. (Patient taking differently: Take 10 mg by mouth daily.) 30 tablet 0 Taking Differently   atorvastatin (LIPITOR) 40 MG tablet Take 40 mg by mouth daily.   1 Taking   buPROPion (WELLBUTRIN XL) 300 MG 24 hr tablet Take 300 mg by mouth daily.   Taking   Calcium Carbonate-Vitamin D (CALTRATE 600+D PO) Take 1 tablet by mouth daily.   Taking   donepezil (ARICEPT) 10 MG tablet Take 10 mg by mouth at bedtime.   Taking   ibuprofen (  ADVIL) 200 MG tablet Take 400 mg by mouth every 6 (six) hours as needed for mild pain (pain score 1-3) or moderate pain (pain score 4-6).   Taking As Needed   meloxicam (MOBIC) 7.5 MG tablet TAKE 1 TABLET(7.5 MG) BY MOUTH DAILY 30 tablet 0 Taking   Multiple Vitamins-Minerals (MULTIVITAMIN WITH MINERALS) tablet Take 1 tablet by mouth daily. Centrum Silver   Taking   traMADol (ULTRAM) 50 MG tablet Take 1 tablet (50 mg  total) by mouth every 6 (six) hours as needed for severe pain (pain score 7-10). 30 tablet 0 Taking As Needed   Colchicine 0.6 MG CAPS TAKE 1 CAPSULE(0.6 MG) BY MOUTH DAILY 24 capsule 0    No Known Allergies  Social History   Tobacco Use   Smoking status: Former    Types: Cigars    Quit date: 10/22/2010    Years since quitting: 13.0   Smokeless tobacco: Never  Substance Use Topics   Alcohol use: No    Alcohol/week: 0.0 standard drinks of alcohol    Family History  Problem Relation Age of Onset   Parkinson's disease Mother    Heart failure Father    Diabetes Brother    Cancer Brother        throat   Hydrocephalus Brother    Diabetes Brother    Migraines Sister      Review of Systems  Objective:  Physical Exam Vitals reviewed.  Constitutional:      Appearance: Normal appearance. He is normal weight.  HENT:     Head: Normocephalic and atraumatic.  Eyes:     Extraocular Movements: Extraocular movements intact.     Pupils: Pupils are equal, round, and reactive to light.  Cardiovascular:     Rate and Rhythm: Normal rate.  Pulmonary:     Effort: Pulmonary effort is normal.     Breath sounds: Normal breath sounds.  Abdominal:     Palpations: Abdomen is soft.  Musculoskeletal:     Cervical back: Normal range of motion and neck supple.     Left knee: Effusion, bony tenderness and crepitus present. Decreased range of motion. Tenderness present over the medial joint line and lateral joint line. Abnormal alignment and abnormal meniscus.  Neurological:     Mental Status: He is alert and oriented to person, place, and time.  Psychiatric:        Behavior: Behavior normal.     Vital signs in last 24 hours:    Labs:   Estimated body mass index is 27.26 kg/m as calculated from the following:   Height as of 10/15/23: 5\' 10"  (1.778 m).   Weight as of 10/15/23: 86.2 kg.   Imaging Review Plain radiographs demonstrate severe degenerative joint disease of the left knee(s).  The overall alignment issignificant varus. The bone quality appears to be fair for age and reported activity level.      Assessment/Plan:  End stage arthritis, left knee   The patient history, physical examination, clinical judgment of the provider and imaging studies are consistent with end stage degenerative joint disease of the left knee(s) and total knee arthroplasty is deemed medically necessary. The treatment options including medical management, injection therapy arthroscopy and arthroplasty were discussed at length. The risks and benefits of total knee arthroplasty were presented and reviewed. The risks due to aseptic loosening, infection, stiffness, patella tracking problems, thromboembolic complications and other imponderables were discussed. The patient acknowledged the explanation, agreed to proceed with the plan and consent was signed.  Patient is being admitted for inpatient treatment for surgery, pain control, PT, OT, prophylactic antibiotics, VTE prophylaxis, progressive ambulation and ADL's and discharge planning. The patient is planning to be discharged home with home health services

## 2023-10-21 ENCOUNTER — Other Ambulatory Visit: Payer: Self-pay

## 2023-10-21 ENCOUNTER — Ambulatory Visit (HOSPITAL_COMMUNITY): Admitting: Certified Registered Nurse Anesthetist

## 2023-10-21 ENCOUNTER — Encounter (HOSPITAL_COMMUNITY): Payer: Self-pay | Admitting: Orthopaedic Surgery

## 2023-10-21 ENCOUNTER — Encounter (HOSPITAL_COMMUNITY)
Admission: AD | Disposition: A | Discharge status: Home Health | Payer: Self-pay | Source: Home / Self Care | Attending: Orthopaedic Surgery

## 2023-10-21 ENCOUNTER — Observation Stay (HOSPITAL_COMMUNITY)

## 2023-10-21 ENCOUNTER — Inpatient Hospital Stay (HOSPITAL_COMMUNITY)
Admission: AD | Admit: 2023-10-21 | Discharge: 2023-10-24 | DRG: 470 | Disposition: A | Discharge status: Home Health | Attending: Orthopaedic Surgery | Admitting: Orthopaedic Surgery

## 2023-10-21 DIAGNOSIS — Z8249 Family history of ischemic heart disease and other diseases of the circulatory system: Secondary | ICD-10-CM | POA: Diagnosis not present

## 2023-10-21 DIAGNOSIS — Z791 Long term (current) use of non-steroidal anti-inflammatories (NSAID): Secondary | ICD-10-CM

## 2023-10-21 DIAGNOSIS — E785 Hyperlipidemia, unspecified: Secondary | ICD-10-CM

## 2023-10-21 DIAGNOSIS — G8918 Other acute postprocedural pain: Secondary | ICD-10-CM | POA: Diagnosis not present

## 2023-10-21 DIAGNOSIS — I639 Cerebral infarction, unspecified: Secondary | ICD-10-CM | POA: Diagnosis not present

## 2023-10-21 DIAGNOSIS — Z79899 Other long term (current) drug therapy: Secondary | ICD-10-CM | POA: Diagnosis not present

## 2023-10-21 DIAGNOSIS — Z833 Family history of diabetes mellitus: Secondary | ICD-10-CM

## 2023-10-21 DIAGNOSIS — E7800 Pure hypercholesterolemia, unspecified: Secondary | ICD-10-CM | POA: Diagnosis present

## 2023-10-21 DIAGNOSIS — M1712 Unilateral primary osteoarthritis, left knee: Secondary | ICD-10-CM | POA: Diagnosis present

## 2023-10-21 DIAGNOSIS — Z820 Family history of epilepsy and other diseases of the nervous system: Secondary | ICD-10-CM | POA: Diagnosis not present

## 2023-10-21 DIAGNOSIS — R7303 Prediabetes: Secondary | ICD-10-CM | POA: Diagnosis present

## 2023-10-21 DIAGNOSIS — I10 Essential (primary) hypertension: Secondary | ICD-10-CM

## 2023-10-21 DIAGNOSIS — R609 Edema, unspecified: Secondary | ICD-10-CM | POA: Diagnosis not present

## 2023-10-21 DIAGNOSIS — K219 Gastro-esophageal reflux disease without esophagitis: Secondary | ICD-10-CM | POA: Diagnosis present

## 2023-10-21 DIAGNOSIS — Z8673 Personal history of transient ischemic attack (TIA), and cerebral infarction without residual deficits: Secondary | ICD-10-CM | POA: Diagnosis not present

## 2023-10-21 DIAGNOSIS — Z96652 Presence of left artificial knee joint: Secondary | ICD-10-CM | POA: Diagnosis not present

## 2023-10-21 DIAGNOSIS — Z471 Aftercare following joint replacement surgery: Secondary | ICD-10-CM | POA: Diagnosis not present

## 2023-10-21 DIAGNOSIS — Z87891 Personal history of nicotine dependence: Secondary | ICD-10-CM

## 2023-10-21 HISTORY — PX: TOTAL KNEE ARTHROPLASTY: SHX125

## 2023-10-21 SURGERY — ARTHROPLASTY, KNEE, TOTAL
Anesthesia: Spinal | Site: Knee | Laterality: Left

## 2023-10-21 MED ORDER — ONDANSETRON HCL 4 MG/2ML IJ SOLN
4.0000 mg | Freq: Four times a day (QID) | INTRAMUSCULAR | Status: DC | PRN
  Administered 2023-10-22: 4 mg via INTRAVENOUS
  Filled 2023-10-21: qty 2

## 2023-10-21 MED ORDER — ONDANSETRON HCL 4 MG/2ML IJ SOLN
INTRAMUSCULAR | Status: DC | PRN
  Administered 2023-10-21: 4 mg via INTRAVENOUS

## 2023-10-21 MED ORDER — FENTANYL CITRATE (PF) 100 MCG/2ML IJ SOLN
INTRAMUSCULAR | Status: DC | PRN
  Administered 2023-10-21 (×2): 50 ug via INTRAVENOUS

## 2023-10-21 MED ORDER — BUPIVACAINE-EPINEPHRINE 0.25% -1:200000 IJ SOLN
INTRAMUSCULAR | Status: DC | PRN
  Administered 2023-10-21: 30 mL

## 2023-10-21 MED ORDER — PHENYLEPHRINE HCL (PRESSORS) 10 MG/ML IV SOLN
INTRAVENOUS | Status: AC
  Filled 2023-10-21: qty 1

## 2023-10-21 MED ORDER — ACETAMINOPHEN 325 MG PO TABS
325.0000 mg | ORAL_TABLET | Freq: Four times a day (QID) | ORAL | Status: DC | PRN
  Administered 2023-10-23 – 2023-10-24 (×2): 650 mg via ORAL
  Filled 2023-10-21 (×3): qty 2

## 2023-10-21 MED ORDER — BUPROPION HCL ER (XL) 300 MG PO TB24
300.0000 mg | ORAL_TABLET | Freq: Every day | ORAL | Status: DC
  Administered 2023-10-21 – 2023-10-24 (×4): 300 mg via ORAL
  Filled 2023-10-21 (×4): qty 1

## 2023-10-21 MED ORDER — SODIUM CHLORIDE 0.9 % IR SOLN
Status: DC | PRN
  Administered 2023-10-21: 1000 mL

## 2023-10-21 MED ORDER — PROPOFOL 1000 MG/100ML IV EMUL
INTRAVENOUS | Status: AC
  Filled 2023-10-21: qty 100

## 2023-10-21 MED ORDER — DROPERIDOL 2.5 MG/ML IJ SOLN: 0.6250 mg | Freq: Once | INTRAMUSCULAR | Status: DC | PRN

## 2023-10-21 MED ORDER — MENTHOL 3 MG MT LOZG: 1.0000 | LOZENGE | OROMUCOSAL | Status: DC | PRN

## 2023-10-21 MED ORDER — BUPIVACAINE-EPINEPHRINE (PF) 0.25% -1:200000 IJ SOLN
INTRAMUSCULAR | Status: AC
Start: 2023-10-21 — End: ?
  Filled 2023-10-21: qty 30

## 2023-10-21 MED ORDER — CEFAZOLIN SODIUM-DEXTROSE 2-4 GM/100ML-% IV SOLN
2.0000 g | Freq: Four times a day (QID) | INTRAVENOUS | Status: AC
  Administered 2023-10-21 (×2): 2 g via INTRAVENOUS
  Filled 2023-10-21 (×2): qty 100

## 2023-10-21 MED ORDER — CHLORHEXIDINE GLUCONATE 4 % EX SOLN: 1.0000 | CUTANEOUS | 1 refills | Status: AC

## 2023-10-21 MED ORDER — CHLORHEXIDINE GLUCONATE 0.12 % MT SOLN
15.0000 mL | Freq: Once | OROMUCOSAL | Status: AC
  Administered 2023-10-21: 15 mL via OROMUCOSAL

## 2023-10-21 MED ORDER — DOCUSATE SODIUM 100 MG PO CAPS
100.0000 mg | ORAL_CAPSULE | Freq: Two times a day (BID) | ORAL | Status: DC
  Administered 2023-10-21 – 2023-10-24 (×7): 100 mg via ORAL
  Filled 2023-10-21 (×7): qty 1

## 2023-10-21 MED ORDER — OXYCODONE HCL 5 MG PO TABS: 5.0000 mg | ORAL_TABLET | Freq: Once | ORAL | Status: DC | PRN

## 2023-10-21 MED ORDER — DIPHENHYDRAMINE HCL 12.5 MG/5ML PO ELIX: 12.5000 mg | ORAL_SOLUTION | ORAL | Status: DC | PRN

## 2023-10-21 MED ORDER — ROPIVACAINE HCL 5 MG/ML IJ SOLN
INTRAMUSCULAR | Status: DC | PRN
  Administered 2023-10-21: 25 mL via PERINEURAL

## 2023-10-21 MED ORDER — STERILE WATER FOR IRRIGATION IR SOLN
Status: DC | PRN
  Administered 2023-10-21: 1000 mL

## 2023-10-21 MED ORDER — ALUM & MAG HYDROXIDE-SIMETH 200-200-20 MG/5ML PO SUSP: 30.0000 mL | ORAL | Status: DC | PRN

## 2023-10-21 MED ORDER — HYDROMORPHONE HCL 1 MG/ML IJ SOLN: 0.5000 mg | INTRAMUSCULAR | Status: DC | PRN

## 2023-10-21 MED ORDER — TRANEXAMIC ACID-NACL 1000-0.7 MG/100ML-% IV SOLN
1000.0000 mg | INTRAVENOUS | Status: AC
  Administered 2023-10-21: 1000 mg via INTRAVENOUS
  Filled 2023-10-21: qty 100

## 2023-10-21 MED ORDER — MUPIROCIN 2 % EX OINT: 1.0000 | TOPICAL_OINTMENT | Freq: Two times a day (BID) | CUTANEOUS | 0 refills | Status: AC

## 2023-10-21 MED ORDER — SODIUM CHLORIDE 0.9 % IV SOLN: INTRAVENOUS | Status: AC

## 2023-10-21 MED ORDER — POVIDONE-IODINE 10 % EX SWAB: 2.0000 | Freq: Once | CUTANEOUS | Status: DC

## 2023-10-21 MED ORDER — AMLODIPINE BESYLATE 10 MG PO TABS
10.0000 mg | ORAL_TABLET | Freq: Every day | ORAL | Status: DC
  Administered 2023-10-21 – 2023-10-24 (×4): 10 mg via ORAL
  Filled 2023-10-21 (×4): qty 1

## 2023-10-21 MED ORDER — PHENYLEPHRINE HCL-NACL 20-0.9 MG/250ML-% IV SOLN
INTRAVENOUS | Status: DC | PRN
  Administered 2023-10-21: 40 ug/min via INTRAVENOUS

## 2023-10-21 MED ORDER — PHENOL 1.4 % MT LIQD: 1.0000 | OROMUCOSAL | Status: DC | PRN

## 2023-10-21 MED ORDER — CLONIDINE HCL (ANALGESIA) 100 MCG/ML EP SOLN
EPIDURAL | Status: DC | PRN
  Administered 2023-10-21: 50 ug

## 2023-10-21 MED ORDER — LACTATED RINGERS IV SOLN: INTRAVENOUS | Status: DC

## 2023-10-21 MED ORDER — METHOCARBAMOL 1000 MG/10ML IJ SOLN: 500.0000 mg | Freq: Four times a day (QID) | INTRAMUSCULAR | Status: DC | PRN

## 2023-10-21 MED ORDER — COLCHICINE 0.6 MG PO TABS
0.6000 mg | ORAL_TABLET | Freq: Every day | ORAL | Status: DC
  Administered 2023-10-21 – 2023-10-24 (×4): 0.6 mg via ORAL
  Filled 2023-10-21 (×4): qty 1

## 2023-10-21 MED ORDER — FENTANYL CITRATE PF 50 MCG/ML IJ SOSY: 25.0000 ug | PREFILLED_SYRINGE | INTRAMUSCULAR | Status: DC | PRN

## 2023-10-21 MED ORDER — METHOCARBAMOL 500 MG PO TABS
500.0000 mg | ORAL_TABLET | Freq: Four times a day (QID) | ORAL | Status: DC | PRN
  Administered 2023-10-21 – 2023-10-22 (×3): 500 mg via ORAL
  Filled 2023-10-21 (×3): qty 1

## 2023-10-21 MED ORDER — OXYCODONE HCL 5 MG PO TABS
5.0000 mg | ORAL_TABLET | ORAL | Status: DC | PRN
  Administered 2023-10-21 (×2): 10 mg via ORAL
  Filled 2023-10-21 (×2): qty 2

## 2023-10-21 MED ORDER — ORAL CARE MOUTH RINSE: 15.0000 mL | Freq: Once | OROMUCOSAL | Status: AC

## 2023-10-21 MED ORDER — PANTOPRAZOLE SODIUM 40 MG PO TBEC
40.0000 mg | DELAYED_RELEASE_TABLET | Freq: Every day | ORAL | Status: DC
  Administered 2023-10-21 – 2023-10-24 (×4): 40 mg via ORAL
  Filled 2023-10-21 (×4): qty 1

## 2023-10-21 MED ORDER — OXYCODONE HCL 5 MG/5ML PO SOLN: 5.0000 mg | Freq: Once | ORAL | Status: DC | PRN

## 2023-10-21 MED ORDER — DEXAMETHASONE SODIUM PHOSPHATE 10 MG/ML IJ SOLN
INTRAMUSCULAR | Status: DC | PRN
  Administered 2023-10-21: 10 mg via INTRAVENOUS

## 2023-10-21 MED ORDER — DONEPEZIL HCL 10 MG PO TABS
10.0000 mg | ORAL_TABLET | Freq: Every day | ORAL | Status: DC
  Administered 2023-10-21 – 2023-10-23 (×3): 10 mg via ORAL
  Filled 2023-10-21 (×3): qty 1

## 2023-10-21 MED ORDER — ONDANSETRON HCL 4 MG PO TABS: 4.0000 mg | ORAL_TABLET | Freq: Four times a day (QID) | ORAL | Status: DC | PRN

## 2023-10-21 MED ORDER — ASPIRIN 81 MG PO CHEW
81.0000 mg | CHEWABLE_TABLET | Freq: Two times a day (BID) | ORAL | Status: DC
  Administered 2023-10-21 – 2023-10-24 (×6): 81 mg via ORAL
  Filled 2023-10-21 (×6): qty 1

## 2023-10-21 MED ORDER — OXYCODONE HCL 5 MG PO TABS
10.0000 mg | ORAL_TABLET | ORAL | Status: DC | PRN
  Administered 2023-10-22 (×2): 10 mg via ORAL
  Filled 2023-10-21 (×2): qty 2

## 2023-10-21 MED ORDER — PROPOFOL 500 MG/50ML IV EMUL
INTRAVENOUS | Status: DC | PRN
  Administered 2023-10-21: 75 ug/kg/min via INTRAVENOUS
  Administered 2023-10-21: 40 mg via INTRAVENOUS
  Administered 2023-10-21: 20 mg via INTRAVENOUS

## 2023-10-21 MED ORDER — CEFAZOLIN SODIUM-DEXTROSE 2-4 GM/100ML-% IV SOLN
2.0000 g | INTRAVENOUS | Status: AC
  Administered 2023-10-21: 2 g via INTRAVENOUS
  Filled 2023-10-21: qty 100

## 2023-10-21 MED ORDER — FENTANYL CITRATE (PF) 100 MCG/2ML IJ SOLN
INTRAMUSCULAR | Status: AC
  Filled 2023-10-21: qty 2

## 2023-10-21 MED ORDER — PROPOFOL 500 MG/50ML IV EMUL: INTRAVENOUS | Status: DC | PRN

## 2023-10-21 MED ORDER — METOCLOPRAMIDE HCL 5 MG/ML IJ SOLN: 5.0000 mg | Freq: Three times a day (TID) | INTRAMUSCULAR | Status: DC | PRN

## 2023-10-21 MED ORDER — METOCLOPRAMIDE HCL 5 MG PO TABS: 5.0000 mg | ORAL_TABLET | Freq: Three times a day (TID) | ORAL | Status: DC | PRN

## 2023-10-21 MED ORDER — 0.9 % SODIUM CHLORIDE (POUR BTL) OPTIME
TOPICAL | Status: DC | PRN
  Administered 2023-10-21: 1000 mL

## 2023-10-21 SURGICAL SUPPLY — 57 items
BAG COUNTER SPONGE SURGICOUNT (BAG) IMPLANT
BAG ZIPLOCK 12X15 (MISCELLANEOUS) ×1 IMPLANT
BENZOIN TINCTURE PRP APPL 2/3 (GAUZE/BANDAGES/DRESSINGS) IMPLANT
BLADE SAG 18X100X1.27 (BLADE) ×1 IMPLANT
BLADE SAW SGTL 11.0X1.19X90.0M (BLADE) IMPLANT
BLADE SURG SZ10 CARB STEEL (BLADE) IMPLANT
BNDG ELASTIC 6INX 5YD STR LF (GAUZE/BANDAGES/DRESSINGS) ×2 IMPLANT
BOWL SMART MIX CTS (DISPOSABLE) IMPLANT
CEMENT BONE R 1X40 (Cement) IMPLANT
COMP TIB KNEE PS G 0D LT (Joint) ×1 IMPLANT
COMPONENT TIB KNEE PS G 0D LT (Joint) IMPLANT
COOLER ICEMAN CLASSIC (MISCELLANEOUS) ×1 IMPLANT
COVER SURGICAL LIGHT HANDLE (MISCELLANEOUS) ×1 IMPLANT
CUFF TRNQT CYL 34X4.125X (TOURNIQUET CUFF) ×1 IMPLANT
DRAPE INCISE IOBAN 66X45 STRL (DRAPES) ×1 IMPLANT
DRAPE U-SHAPE 47X51 STRL (DRAPES) ×1 IMPLANT
DURAPREP 26ML APPLICATOR (WOUND CARE) ×1 IMPLANT
ELECT BLADE TIP CTD 4 INCH (ELECTRODE) ×1 IMPLANT
ELECT PENCIL ROCKER SW 15FT (MISCELLANEOUS) ×1 IMPLANT
ELECT REM PT RETURN 15FT ADLT (MISCELLANEOUS) ×1 IMPLANT
FEMUR CMT CCR STD SZ8 L KNEE (Knees) ×1 IMPLANT
FEMUR CMTD CCR STD SZ8 L KNEE (Knees) IMPLANT
GAUZE PAD ABD 8X10 STRL (GAUZE/BANDAGES/DRESSINGS) ×2 IMPLANT
GAUZE SPONGE 4X4 12PLY STRL (GAUZE/BANDAGES/DRESSINGS) ×1 IMPLANT
GAUZE XEROFORM 1X8 LF (GAUZE/BANDAGES/DRESSINGS) IMPLANT
GLOVE BIO SURGEON STRL SZ7.5 (GLOVE) ×1 IMPLANT
GLOVE BIOGEL PI IND STRL 8 (GLOVE) ×2 IMPLANT
GLOVE ECLIPSE 8.0 STRL XLNG CF (GLOVE) ×1 IMPLANT
GOWN STRL REUS W/ TWL XL LVL3 (GOWN DISPOSABLE) ×2 IMPLANT
HOLDER FOLEY CATH W/STRAP (MISCELLANEOUS) IMPLANT
IMMOBILIZER KNEE 20 (SOFTGOODS) ×1 IMPLANT
IMMOBILIZER KNEE 20 THIGH 36 (SOFTGOODS) ×1 IMPLANT
KIT TURNOVER KIT A (KITS) IMPLANT
LINER TIB POST GH/6-9 14 LT (Liner) IMPLANT
MANIFOLD NEPTUNE II (INSTRUMENTS) ×1 IMPLANT
NS IRRIG 1000ML POUR BTL (IV SOLUTION) ×1 IMPLANT
PACK TOTAL KNEE CUSTOM (KITS) ×1 IMPLANT
PAD COLD SHLDR WRAP-ON (PAD) ×1 IMPLANT
PADDING CAST COTTON 6X4 STRL (CAST SUPPLIES) ×2 IMPLANT
PIN DRILL HDLS TROCAR 75 4PK (PIN) IMPLANT
PROTECTOR NERVE ULNAR (MISCELLANEOUS) ×1 IMPLANT
SCREW FEMALE HEX FIX 25X2.5 (ORTHOPEDIC DISPOSABLE SUPPLIES) IMPLANT
SCREW HEADED 33MM KNEE (MISCELLANEOUS) IMPLANT
SET HNDPC FAN SPRY TIP SCT (DISPOSABLE) ×1 IMPLANT
SET PAD KNEE POSITIONER (MISCELLANEOUS) ×1 IMPLANT
SPIKE FLUID TRANSFER (MISCELLANEOUS) IMPLANT
STAPLER SKIN PROX WIDE 3.9 (STAPLE) IMPLANT
STEM POLY PAT PLY 32M KNEE (Knees) IMPLANT
STRIP CLOSURE SKIN 1/2X4 (GAUZE/BANDAGES/DRESSINGS) IMPLANT
SUT MNCRL AB 4-0 PS2 18 (SUTURE) IMPLANT
SUT VIC AB 0 CT1 36 (SUTURE) ×2 IMPLANT
SUT VIC AB 1 CT1 36 (SUTURE) ×2 IMPLANT
SUT VIC AB 2-0 CT1 TAPERPNT 27 (SUTURE) ×2 IMPLANT
TOWEL GREEN STERILE FF (TOWEL DISPOSABLE) ×1 IMPLANT
TRAY FOLEY MTR SLVR 16FR STAT (SET/KITS/TRAYS/PACK) IMPLANT
WATER STERILE IRR 1000ML POUR (IV SOLUTION) ×2 IMPLANT
YANKAUER SUCT BULB TIP NO VENT (SUCTIONS) ×1 IMPLANT

## 2023-10-21 NOTE — Op Note (Signed)
 Operative Note  Date of operation: 10/21/2023 Preoperative diagnosis: Severe primary osteoarthritis left knee Postoperative diagnosis: Same  Procedure: Left cemented total knee arthroplasty  Implants: Biomet/Zimmer persona cemented knee system Implant Name Type Inv. Item Serial No. Manufacturer Lot No. LRB No. Used Action  CEMENT BONE R 1X40 - [id number] Cement CEMENT BONE R 1X40  ZIMMER RECON(ORTH,TRAU,BIO,SG) [id number] Left 2 Implanted  STEM POLY PAT PLY 38M KNEE - [id number] Knees STEM POLY PAT PLY 38M KNEE  ZIMMER RECON(ORTH,TRAU,BIO,SG) [id number] Left 1 Implanted  LEFT 14mm PS Articular Surface Knees   ZIMMER RECON(ORTH,TRAU,BIO,SG) [id number] Left 1 Implanted  FEMUR CMT CCR STD SZ8 L KNEE - [id number] Knees FEMUR CMT CCR STD SZ8 L KNEE  ZIMMER RECON(ORTH,TRAU,BIO,SG) [id number] Left 1 Implanted  COMP TIB KNEE PS G 0D LT - [id number] Joint COMP TIB KNEE PS G 0D LT  ZIMMER RECON(ORTH,TRAU,BIO,SG) [id number] Left 1 Implanted   Surgeon: Vanita Panda. Magnus Ivan, MD Assistant: Rexene Edison, PA-C  Anesthesia: #1 left lower extremity adductor canal block, #2 spinal, #3 local Tourniquet time: Less than 1 hour EBL: Less than 100 cc Antibiotics: IV Ancef Locations: None  Indications: The patient is a 83 year old active gentleman who has been dealing with debilitating arthritis of his left knee for many many years now.  He has tried and failed all forms conservative treatment.  He has severe and stage arthritis with deformity of that left knee.  At this point he wishes to proceed with a knee replacement given his daily left knee pain and the detrimental effect this is having on his mobility, his quality of life and his actives daily living.  We did discuss the risk of acute blood loss anemia, nerve or vessel injury, fracture, infection, implant failure, DVT, knee instability and wound healing issues.  He understands that our goals are hopefully decreased pain, improved mobility, and improved quality of  life.  Procedure description: After informed consent was obtained and the appropriate left knee was marked, the patient had a left lower extremity adductor canal block by anesthesia in the holding room.  The patient was then brought to the operating room and set up on the operating table where spinal anesthesia was obtained.  He was then laid in supine position on the operating table and a Foley catheter was placed.  A nonsterile tractors placed around his upper left thigh and his left thigh, knee, leg and ankle were prepped and draped with DuraPrep and sterile drapes including a sterile stockinette.  A timeout was called and he was identified as the correct patient the correct left knee.  An Esmarch was then used to wrap out the leg and the tourniquet was plated 300 mm pressure.  With the knee extended a direct midline incision was made over the patella and carried proximally distally.  Dissection was carried down to the knee joint and a medial parapatellar arthrotomy was made finding a large joint effusion and significant synovitis.  With the knee in a flexed position we found complete cartilage loss throughout the entire knee.  Remnants of the ACL as well as medial lateral meniscus and PCL were removed.  Osteophytes removed from all 3 compartments.  We then used extramedullary cutting guide for making our proximal tibia cut correction for varus and valgus and a 5 degree slope.  We made this cut to take 2 mm off the low side and we backed this down to more millimeters.  We then used a intramedullary based cutting guide for our distal femur cut setting this  for a left knee at 5 degrees externally rotated and a 10 mm distal femoral cut.  We made that cut without difficulty and brought the knee back down to full extension and had achieved full extension with a 10 mm extension block.  We then backed the femur and put a femoral sizing guide based off the epicondylar axis.  Based off of this we chose a size 8 femur.   We put a 4-in-1 cutting block for a size 8 femur and made our anterior and posterior cuts followed our chamfer cuts.  We then made our femoral box cut knowing that we wanted to use a PS knee.  We then backed the tibia and chose a size D left tibial tray for coverage over the tibial plateau.  There was a bone cyst that was noted in the medial tibial plateau.  We did our drill hole and keel punch off of this size G tibia and again we said the rotation off of the tibial tubercle and the femur.  We then trialed our size G left tibia followed by our size 8 left PS standard femur.  We trialed up to a 14 mm thickness PS polythene liner for a left knee.  We are pleased the range of motion and stability of that polythene liner combined with the implants themselves.  We then made a patella cut and drilled 79-year-old for a size 32 patella button.  Again with all trial instrumentation the knee replaced the range of motion and stability in his improvement overall in alignment.  We then removed all transportation from the knee and irrigate the knee with normal saline solution.  We then placed Marcaine with epinephrine around the arthrotomy.  Next we mixed our cement with the knee in a flexed position cemented our Biomet/Zimmer persona tibial tray for a left knee size G followed by cementing our size 8 standard PS femur for a left knee.  We placed our 14 mm thickness PS polyethylene insert and cemented our size 32 patella button.  We then held the knee extended and compressed the knee while the cement hardened.  Once the cemented hardened we removed any excess cement debris from the knee.  The tourniquet was let down and hemostasis was obtained with electrocautery.  The arthrotomy is closed with interrupted #1 Vicryl suture followed by 0 Vicryl goes deep tissue and 2-0 Vicryl cut subcutaneous tissue.  The skin was closed with staples.  Well-padded sterile dressings applied.  The patient was taken the recovery room.  Rexene Edison,  PA-C did assist in interrogation beginning and his assistance was crucial and medically necessary for soft tissue management and retraction, helping guide implant placement and a layered closure of the wound.

## 2023-10-21 NOTE — Anesthesia Postprocedure Evaluation (Signed)
 Anesthesia Post Note  Patient: Kenneth Harper  Procedure(s) Performed: ARTHROPLASTY, KNEE, TOTAL (Left: Knee)     Patient location during evaluation: PACU Anesthesia Type: Spinal Level of consciousness: awake and alert Pain management: pain level controlled Vital Signs Assessment: post-procedure vital signs reviewed and stable Respiratory status: spontaneous breathing and respiratory function stable Cardiovascular status: blood pressure returned to baseline and stable Postop Assessment: spinal receding Anesthetic complications: no  No notable events documented.  Last Vitals:  Vitals:   10/21/23 1114 10/21/23 1302  BP: 133/64 112/65  Pulse: 66 85  Resp: 16 16  Temp: 36.7 C 36.4 C  SpO2: 97% 99%    Last Pain:  Vitals:   10/21/23 1302  TempSrc: Oral  PainSc:                  Kennieth Rad

## 2023-10-21 NOTE — Interval H&P Note (Signed)
 History and Physical Interval Note: The patient understands that he is here today for a left total knee replacement to treat his significant left knee pain and arthritis.  There has been no acute or interval change in his medical status.  The risks and benefits of surgery have been discussed in detail and informed consent has been obtained.  The left operative knee has been marked.  10/21/2023 7:09 AM  Kenneth Harper  has presented today for surgery, with the diagnosis of osteoarthritis left knee.  The various methods of treatment have been discussed with the patient and family. After consideration of risks, benefits and other options for treatment, the patient has consented to  Procedure(s): ARTHROPLASTY, KNEE, TOTAL (Left) as a surgical intervention.  The patient's history has been reviewed, patient examined, no change in status, stable for surgery.  I have reviewed the patient's chart and labs.  Questions were answered to the patient's satisfaction.     Kathryne Hitch

## 2023-10-21 NOTE — Evaluation (Signed)
 Physical Therapy Evaluation Patient Details Name: Kenneth Harper MRN: [id number] DOB: [birth year] Today's Date: 10/21/2023  History of Present Illness  Pt is an 83 year old male s/p L TKA on 10/21/23.  PMHx: memory loss, CVA, CTS, lumbar back surgery  Clinical Impression  Pt is s/p L TKA resulting in the deficits listed below (see PT Problem List).  Pt will benefit from acute skilled PT to increase their independence and safety with mobility to allow discharge.  Pt ambulated 80 ft in hallway POD #0.  Pt plans to return home upon d/c.  Pt lives with his wife and son will assist upon d/c as needed.  Pt does report he needs a RW for home.         If plan is discharge home, recommend the following: Help with stairs or ramp for entrance;Assistance with cooking/housework;Assist for transportation   Can travel by private vehicle        Equipment Recommendations Rolling walker (2 wheels)  Recommendations for Other Services       Functional Status Assessment Patient has had a recent decline in their functional status and demonstrates the ability to make significant improvements in function in a reasonable and predictable amount of time.     Precautions / Restrictions Precautions Precautions: Knee;Fall Required Braces or Orthoses: Knee Immobilizer - Left Restrictions Weight Bearing Restrictions Per Provider Order: No LLE Weight Bearing Per Provider Order: Weight bearing as tolerated      Mobility  Bed Mobility Overal bed mobility: Needs Assistance Bed Mobility: Supine to Sit     Supine to sit: Contact guard, HOB elevated     General bed mobility comments: verbal cues for sequence and technique    Transfers Overall transfer level: Needs assistance Equipment used: Rolling walker (2 wheels) Transfers: Sit to/from Stand Sit to Stand: Contact guard assist           General transfer comment: verbal cues for UE and LE positioning for pain control     Ambulation/Gait Ambulation/Gait assistance: Contact guard assist Gait Distance (Feet): 80 Feet Assistive device: Rolling walker (2 wheels) Gait Pattern/deviations: Step-to pattern, Decreased stance time - left, Antalgic Gait velocity: decr     General Gait Details: verbal cues for sequence, RW positioning, step length, posture  Stairs            Wheelchair Mobility     Tilt Bed    Modified Rankin (Stroke Patients Only)       Balance                                             Pertinent Vitals/Pain Pain Assessment Pain Assessment: Faces Faces Pain Scale: Hurts little more Pain Location: left knee Pain Descriptors / Indicators: Sore, Aching Pain Intervention(s): Monitored during session, Repositioned    Home Living Family/patient expects to be discharged to:: Private residence Living Arrangements: Spouse/significant other Available Help at Discharge: Family;Available 24 hours/day Type of Home: House Home Access: Stairs to enter Entrance Stairs-Rails: Right;Left;Can reach both Entrance Stairs-Number of Steps: 3-4   Home Layout: Able to live on main level with bedroom/bathroom Home Equipment: Gilmer Mor - single point      Prior Function Prior Level of Function : Independent/Modified Independent                     Extremity/Trunk Assessment  Lower Extremity Assessment Lower Extremity Assessment: LLE deficits/detail LLE Deficits / Details: maintained KI, assist for SLR       Communication   Communication Communication: No apparent difficulties    Cognition Arousal: Alert Behavior During Therapy: WFL for tasks assessed/performed   PT - Cognitive impairments: No apparent impairments                         Following commands: Intact       Cueing       General Comments      Exercises     Assessment/Plan    PT Assessment Patient needs continued PT services  PT Problem List Decreased  strength;Decreased range of motion;Decreased mobility;Decreased activity tolerance;Pain;Decreased knowledge of precautions;Decreased knowledge of use of DME       PT Treatment Interventions DME instruction;Gait training;Balance training;Functional mobility training;Therapeutic exercise;Therapeutic activities;Patient/family education;Stair training    PT Goals (Current goals can be found in the Care Plan section)  Acute Rehab PT Goals PT Goal Formulation: With patient Time For Goal Achievement: 11/04/23 Potential to Achieve Goals: Good    Frequency 7X/week     Co-evaluation               AM-PAC PT "6 Clicks" Mobility  Outcome Measure Help needed turning from your back to your side while in a flat bed without using bedrails?: A Little Help needed moving from lying on your back to sitting on the side of a flat bed without using bedrails?: A Little Help needed moving to and from a bed to a chair (including a wheelchair)?: A Little Help needed standing up from a chair using your arms (e.g., wheelchair or bedside chair)?: A Little Help needed to walk in hospital room?: A Little Help needed climbing 3-5 steps with a railing? : A Little 6 Click Score: 18    End of Session Equipment Utilized During Treatment: Gait belt;Left knee immobilizer Activity Tolerance: Patient tolerated treatment well Patient left: in chair;with call bell/phone within reach;with chair alarm set;with family/visitor present Nurse Communication: Mobility status PT Visit Diagnosis: Difficulty in walking, not elsewhere classified (R26.2)    Time: 1424-1440 PT Time Calculation (min) (ACUTE ONLY): 16 min   Charges:   PT Evaluation $PT Eval Low Complexity: 1 Low   PT General Charges $$ ACUTE PT VISIT: 1 Visit        Thomasene Mohair PT, DPT Physical Therapist Acute Rehabilitation Services Office: [phone]   Janan Halter Payson 10/21/2023, 3:12 PM

## 2023-10-21 NOTE — Transfer of Care (Signed)
 Immediate Anesthesia Transfer of Care Note  Patient: Kenneth Harper  Procedure(s) Performed: ARTHROPLASTY, KNEE, TOTAL (Left: Knee)  Patient Location: PACU  Anesthesia Type:Spinal  Level of Consciousness: awake and drowsy  Airway & Oxygen Therapy: Patient Spontanous Breathing and Patient connected to face mask oxygen  Post-op Assessment: Report given to RN and Post -op Vital signs reviewed and stable  Post vital signs: Reviewed and stable  Last Vitals:  Vitals Value Taken Time  BP 118/71 10/21/23 0931  Temp    Pulse 63 10/21/23 0934  Resp 15 10/21/23 0934  SpO2 100 % 10/21/23 0934  Vitals shown include unfiled device data.  Last Pain:  Vitals:   10/21/23 0610  TempSrc:   PainSc: 0-No pain      Patients Stated Pain Goal: 2 (10/21/23 0610)  Complications: No notable events documented.

## 2023-10-21 NOTE — Anesthesia Procedure Notes (Signed)
 Anesthesia Regional Block: Adductor canal block   Pre-Anesthetic Checklist: , timeout performed,  Correct Patient, Correct Site, Correct Laterality,  Correct Procedure, Correct Position, site marked,  Risks and benefits discussed,  Surgical consent,  Pre-op evaluation,  At surgeon's request and post-op pain management  Laterality: Left  Prep: chloraprep       Needles:  Injection technique: Single-shot  Needle Type: Echogenic Needle     Needle Length: 9cm  Needle Gauge: 21     Additional Needles:   Procedures:,,,, ultrasound used (permanent image in chart),,    Narrative:  Start time: 10/21/2023 6:55 AM End time: 10/21/2023 7:01 AM Injection made incrementally with aspirations every 5 mL.  Performed by: Personally  Anesthesiologist: Marcene Duos, MD

## 2023-10-21 NOTE — Anesthesia Procedure Notes (Addendum)
 Spinal  Patient location during procedure: OR Start time: 10/21/2023 7:33 AM End time: 10/21/2023 7:39 AM Reason for block: surgical anesthesia Staffing Performed: anesthesiologist  Anesthesiologist: Marcene Duos, MD Performed by: Marcene Duos, MD Authorized by: Marcene Duos, MD   Preanesthetic Checklist Completed: patient identified, IV checked, site marked, risks and benefits discussed, surgical consent, monitors and equipment checked, pre-op evaluation and timeout performed Spinal Block Patient position: sitting Prep: DuraPrep Patient monitoring: heart rate, cardiac monitor, continuous pulse ox and blood pressure Approach: right paramedian Location: L4-5 Injection technique: single-shot Needle Needle type: Quincke  Needle gauge: 22 G Needle length: 9 cm Assessment Sensory level: T4 Events: CSF return and second provider

## 2023-10-21 NOTE — Anesthesia Preprocedure Evaluation (Signed)
 Anesthesia Evaluation  Patient identified by MRN, date of birth, ID band Patient awake    Reviewed: Allergy & Precautions, NPO status , Patient's Chart, lab work & pertinent test results  Airway Mallampati: II  TM Distance: >3 FB Neck ROM: Full    Dental   Pulmonary former smoker   breath sounds clear to auscultation       Cardiovascular hypertension, Pt. on medications  Rhythm:Regular Rate:Normal     Neuro/Psych  Neuromuscular disease CVA, No Residual Symptoms    GI/Hepatic Neg liver ROS,GERD  ,,  Endo/Other  negative endocrine ROS    Renal/GU Renal disease     Musculoskeletal  (+) Arthritis ,    Abdominal   Peds  Hematology negative hematology ROS (+)   Anesthesia Other Findings   Reproductive/Obstetrics                             Anesthesia Physical Anesthesia Plan  ASA: 3  Anesthesia Plan: Spinal   Post-op Pain Management: Ofirmev IV (intra-op)* and Regional block*   Induction:   PONV Risk Score and Plan: 1 and Propofol infusion and Dexamethasone  Airway Management Planned: Natural Airway and Simple Face Mask  Additional Equipment:   Intra-op Plan:   Post-operative Plan:   Informed Consent: I have reviewed the patients History and Physical, chart, labs and discussed the procedure including the risks, benefits and alternatives for the proposed anesthesia with the patient or authorized representative who has indicated his/her understanding and acceptance.       Plan Discussed with: CRNA  Anesthesia Plan Comments:        Anesthesia Quick Evaluation

## 2023-10-22 ENCOUNTER — Other Ambulatory Visit: Payer: Self-pay | Admitting: *Deleted

## 2023-10-22 DIAGNOSIS — M1712 Unilateral primary osteoarthritis, left knee: Secondary | ICD-10-CM

## 2023-10-22 DIAGNOSIS — Z96652 Presence of left artificial knee joint: Secondary | ICD-10-CM

## 2023-10-22 LAB — CBC
HCT: 34 % — ABNORMAL LOW (ref 39.0–52.0)
Hemoglobin: 10.4 g/dL — ABNORMAL LOW (ref 13.0–17.0)
MCH: 27.7 pg (ref 26.0–34.0)
MCHC: 30.6 g/dL (ref 30.0–36.0)
MCV: 90.4 fL (ref 80.0–100.0)
Platelets: 211 10*3/uL (ref 150–400)
RBC: 3.76 MIL/uL — ABNORMAL LOW (ref 4.22–5.81)
RDW: 16.4 % — ABNORMAL HIGH (ref 11.5–15.5)
WBC: 7.2 10*3/uL (ref 4.0–10.5)
nRBC: 0 % (ref 0.0–0.2)

## 2023-10-22 LAB — BASIC METABOLIC PANEL WITH GFR
Anion gap: 6 (ref 5–15)
BUN: 21 mg/dL (ref 8–23)
CO2: 26 mmol/L (ref 22–32)
Calcium: 8.3 mg/dL — ABNORMAL LOW (ref 8.9–10.3)
Chloride: 105 mmol/L (ref 98–111)
Creatinine, Ser: 1.17 mg/dL (ref 0.61–1.24)
GFR, Estimated: >60 mL/min (ref >60)
Glucose, Bld: 137 mg/dL — ABNORMAL HIGH (ref 70–99)
Potassium: 4.3 mmol/L (ref 3.5–5.1)
Sodium: 137 mmol/L (ref 135–145)

## 2023-10-22 NOTE — Progress Notes (Signed)
 Subjective: 1 Day Post-Op Procedure(s) (LRB): ARTHROPLASTY, KNEE, TOTAL (Left) Patient reports pain as moderate.    Objective: Vital signs in last 24 hours: Temp:  [97.6 F (36.4 C)-98.2 F (36.8 C)] 97.7 F (36.5 C) (04/11 0549) Pulse Rate:  [62-85] 76 (04/11 0549) Resp:  [6-20] 18 (04/11 0549) BP: (107-133)/(55-83) 121/78 (04/11 0549) SpO2:  [94 %-100 %] 97 % (04/11 0549)  Intake/Output from previous day: 04/10 0701 - 04/11 0700 In: 2840.5 [P.O.:600; I.V.:1940.5; IV Piggyback:300] Out: 3275 [Urine:3225; Blood:50] Intake/Output this shift: No intake/output data recorded.  No results for input(s): "HGB" in the last 72 hours. No results for input(s): "WBC", "RBC", "HCT", "PLT" in the last 72 hours. No results for input(s): "NA", "K", "CL", "CO2", "BUN", "CREATININE", "GLUCOSE", "CALCIUM" in the last 72 hours. No results for input(s): "LABPT", "INR" in the last 72 hours.  Sensation intact distally Intact pulses distally Dorsiflexion/Plantar flexion intact Incision: dressing C/D/I Compartment soft   Assessment/Plan: 1 Day Post-Op Procedure(s) (LRB): ARTHROPLASTY, KNEE, TOTAL (Left) Up with therapy Check labs. Will see how well he mobilizes with therapy today.  May need to stay one more day to maximize mobility with PT; depends on his progress     Kathryne Hitch 10/22/2023, 7:09 AM

## 2023-10-22 NOTE — Progress Notes (Signed)
 Physical Therapy Treatment Patient Details Name: Kenneth Harper MRN: [id number] DOB: 05-29-1941 Today's Date: 10/22/2023   History of Present Illness Pt is an 83 year old male s/p L TKA on 10/21/23.  PMHx: memory loss, CVA, CTS, lumbar back surgery    PT Comments  Pt ambulated in hallway and performed LE exercises in recliner.  Pt reports increased pain today and feeling more sore.  Pt does not feel ready for d/c.  Pt repositioned in recliner and reapplied iceman.    If plan is discharge home, recommend the following: Help with stairs or ramp for entrance;Assistance with cooking/housework;Assist for transportation   Can travel by private vehicle        Equipment Recommendations  Rolling walker (2 wheels)    Recommendations for Other Services       Precautions / Restrictions Precautions Precautions: Knee;Fall Precaution/Restrictions Comments: able to perform SLR Restrictions LLE Weight Bearing Per Provider Order: Weight bearing as tolerated     Mobility  Bed Mobility Overal bed mobility: Needs Assistance Bed Mobility: Supine to Sit     Supine to sit: Contact guard, HOB elevated     General bed mobility comments: verbal cues for sequence and technique    Transfers Overall transfer level: Needs assistance Equipment used: Rolling walker (2 wheels) Transfers: Sit to/from Stand Sit to Stand: Contact guard assist           General transfer comment: verbal cues for UE and LE positioning for pain control    Ambulation/Gait Ambulation/Gait assistance: Contact guard assist Gait Distance (Feet): 120 Feet Assistive device: Rolling walker (2 wheels) Gait Pattern/deviations: Step-to pattern, Decreased stance time - left, Antalgic Gait velocity: decr     General Gait Details: verbal cues for sequence, RW positioning, step length, posture   Stairs             Wheelchair Mobility     Tilt Bed    Modified Rankin (Stroke Patients Only)       Balance                                             Communication Communication Communication: No apparent difficulties  Cognition Arousal: Alert Behavior During Therapy: WFL for tasks assessed/performed   PT - Cognitive impairments: No apparent impairments                         Following commands: Intact      Cueing    Exercises Total Joint Exercises Ankle Circles/Pumps: AROM, 10 reps, Both Quad Sets: AROM, Both, 10 reps Hip ABduction/ADduction: AAROM, Left, 10 reps Straight Leg Raises: AAROM, Left, 10 reps Knee Flexion: AAROM, Left, 10 reps, Seated L knee AAROM: 5-70*    General Comments        Pertinent Vitals/Pain Pain Assessment Pain Assessment: 0-10 Pain Score: 6  Pain Location: left knee and thigh Pain Descriptors / Indicators: Sore, Aching Pain Intervention(s): Repositioned, Monitored during session, Ice applied    Home Living                          Prior Function            PT Goals (current goals can now be found in the care plan section) Progress towards PT goals: Progressing toward goals    Frequency  7X/week      PT Plan      Co-evaluation              AM-PAC PT "6 Clicks" Mobility   Outcome Measure  Help needed turning from your back to your side while in a flat bed without using bedrails?: A Little Help needed moving from lying on your back to sitting on the side of a flat bed without using bedrails?: A Little Help needed moving to and from a bed to a chair (including a wheelchair)?: A Little Help needed standing up from a chair using your arms (e.g., wheelchair or bedside chair)?: A Little Help needed to walk in hospital room?: A Little Help needed climbing 3-5 steps with a railing? : A Little 6 Click Score: 18    End of Session Equipment Utilized During Treatment: Gait belt Activity Tolerance: Patient tolerated treatment well Patient left: in chair;with call bell/phone within reach;with  chair alarm set Nurse Communication: Mobility status PT Visit Diagnosis: Difficulty in walking, not elsewhere classified (R26.2)     Time: 1610-9604 PT Time Calculation (min) (ACUTE ONLY): 17 min  Charges:    $Therapeutic Exercise: 8-22 mins PT General Charges $$ ACUTE PT VISIT: 1 Visit                    Paulino Door, DPT Physical Therapist Acute Rehabilitation Services Office: [phone]    Janan Halter Payson 10/22/2023, 12:46 PM

## 2023-10-22 NOTE — TOC Transition Note (Signed)
 Transition of Care [facility]) - Discharge Note   Patient Details  Name: Kenneth Harper MRN: [id number] Date of Birth: [birth year]  Transition of Care [facility]) CM/SW Contact:  Amada Jupiter, LCSW Phone Number: 10/22/2023, 10:13 AM   Clinical Narrative:     Met with pt who confirms he does need a RW.  No DME agency preference.  Order placed with [employer] and item delivered to room.  HHPT prearranged with [facility] via ortho MD office prior to surgery.  No further TOC needs.  Final next level of care: Home w Home Health Services Barriers to Discharge: No Barriers Identified   Patient Goals and CMS Choice Patient states their goals for this hospitalization and ongoing recovery are:: return home          Discharge Placement                       Discharge Plan and Services Additional resources added to the After Visit Summary for                  DME Arranged: Walker rolling DME Agency: [employer] Date DME Agency Contacted: 10/22/23 Time DME Agency Contacted: [id number] Representative spoke with at DME Agency: Loraine Leriche HH Arranged: PT HH Agency: [facility]        Social Drivers of Health (SDOH) Interventions SDOH Screenings   Food Insecurity: No Food Insecurity (10/21/2023)  Housing: Low Risk  (10/21/2023)  Transportation Needs: No Transportation Needs (10/21/2023)  Utilities: Not At Risk (10/21/2023)  Depression (PHQ2-9): Low Risk  (08/26/2020)  Social Connections: Socially Integrated (10/21/2023)  Tobacco Use: Medium Risk (10/21/2023)     Readmission Risk Interventions     No data to display

## 2023-10-22 NOTE — Progress Notes (Signed)
 Physical Therapy Treatment Patient Details Name: Kenneth Harper MRN: [id number] DOB: 1941/03/31 Today's Date: 10/22/2023   History of Present Illness Pt is an 83 year old male s/p L TKA on 10/21/23.  PMHx: memory loss, CVA, CTS, lumbar back surgery    PT Comments  Pt reports generally not feeling well today but no specific complaints.  Pt ambulated in hallway and left in bathroom per his request.  Pt agreeable to use pull cord for assist off toilet and secretary alerted.  Pt does not anticipate d/c home today and will need to practice steps prior to return home.    If plan is discharge home, recommend the following: Help with stairs or ramp for entrance;Assistance with cooking/housework;Assist for transportation   Can travel by private vehicle        Equipment Recommendations  Rolling walker (2 wheels)    Recommendations for Other Services       Precautions / Restrictions Precautions Precautions: Knee;Fall Precaution/Restrictions Comments: able to perform SLR Restrictions LLE Weight Bearing Per Provider Order: Weight bearing as tolerated     Mobility  Bed Mobility Overal bed mobility: Needs Assistance Bed Mobility: Supine to Sit     Supine to sit: Contact guard, HOB elevated     General bed mobility comments: verbal cues for sequence and technique    Transfers Overall transfer level: Needs assistance Equipment used: Rolling walker (2 wheels) Transfers: Sit to/from Stand Sit to Stand: Contact guard assist           General transfer comment: verbal cues for UE and LE positioning for pain control    Ambulation/Gait Ambulation/Gait assistance: Contact guard assist Gait Distance (Feet): 120 Feet Assistive device: Rolling walker (2 wheels) Gait Pattern/deviations: Step-to pattern, Decreased stance time - left, Antalgic Gait velocity: decr     General Gait Details: verbal cues for sequence, RW positioning, step length, posture   Stairs              Wheelchair Mobility     Tilt Bed    Modified Rankin (Stroke Patients Only)       Balance                                            Communication Communication Communication: No apparent difficulties  Cognition Arousal: Alert Behavior During Therapy: WFL for tasks assessed/performed   PT - Cognitive impairments: No apparent impairments                         Following commands: Intact      Cueing    Exercises     General Comments        Pertinent Vitals/Pain Pain Assessment Pain Assessment: 0-10 Pain Score: 6  Pain Location: left knee and thigh Pain Descriptors / Indicators: Sore, Aching Pain Intervention(s): Repositioned, Monitored during session    Home Living                          Prior Function            PT Goals (current goals can now be found in the care plan section) Progress towards PT goals: Progressing toward goals    Frequency    7X/week      PT Plan      Co-evaluation  AM-PAC PT "6 Clicks" Mobility   Outcome Measure  Help needed turning from your back to your side while in a flat bed without using bedrails?: A Little Help needed moving from lying on your back to sitting on the side of a flat bed without using bedrails?: A Little Help needed moving to and from a bed to a chair (including a wheelchair)?: A Little Help needed standing up from a chair using your arms (e.g., wheelchair or bedside chair)?: A Little Help needed to walk in hospital room?: A Little Help needed climbing 3-5 steps with a railing? : A Little 6 Click Score: 18    End of Session Equipment Utilized During Treatment: Gait belt Activity Tolerance: Patient tolerated treatment well Patient left: with call bell/phone within reach (in bathroom, agreeable to use pull cord and not get up without staff assist) Nurse Communication: Mobility status PT Visit Diagnosis: Difficulty in walking, not  elsewhere classified (R26.2)     Time: 1610-9604 PT Time Calculation (min) (ACUTE ONLY): 12 min  Charges:    $Gait Training: 8-22 mins  PT General Charges $$ ACUTE PT VISIT: 1 Visit                     Paulino Door, DPT Physical Therapist Acute Rehabilitation Services Office: [phone]    Janan Halter Payson 10/22/2023, 4:09 PM

## 2023-10-22 NOTE — Plan of Care (Signed)
 Problem: Education: Goal: Knowledge of General Education information will improve Description: Including pain rating scale, medication(s)/side effects and non-pharmacologic comfort measures Outcome: Progressing   Problem: Clinical Measurements: Goal: Ability to maintain clinical measurements within normal limits will improve Outcome: Progressing   Problem: Activity: Goal: Risk for activity intolerance will decrease Outcome: Progressing   Problem: Coping: Goal: Level of anxiety will decrease Outcome: Progressing   Problem: Elimination: Goal: Will not experience complications related to bowel motility Outcome: Progressing   Problem: Pain Managment: Goal: General experience of comfort will improve and/or be controlled Outcome: Progressing   Haydee Salter, RN 10/22/23 4:51 PM

## 2023-10-22 NOTE — Care Management Obs Status (Signed)
 MEDICARE OBSERVATION STATUS NOTIFICATION   Patient Details  Name: Kenneth Harper MRN: [id number] Date of Birth: 1940/09/14   Medicare Observation Status Notification Given:  Yes    Amada Jupiter, LCSW 10/22/2023, 10:10 AM

## 2023-10-23 DIAGNOSIS — Z87891 Personal history of nicotine dependence: Secondary | ICD-10-CM | POA: Diagnosis not present

## 2023-10-23 DIAGNOSIS — Z791 Long term (current) use of non-steroidal anti-inflammatories (NSAID): Secondary | ICD-10-CM | POA: Diagnosis not present

## 2023-10-23 DIAGNOSIS — I10 Essential (primary) hypertension: Secondary | ICD-10-CM | POA: Diagnosis present

## 2023-10-23 DIAGNOSIS — R7303 Prediabetes: Secondary | ICD-10-CM | POA: Diagnosis present

## 2023-10-23 DIAGNOSIS — M1712 Unilateral primary osteoarthritis, left knee: Secondary | ICD-10-CM | POA: Diagnosis present

## 2023-10-23 DIAGNOSIS — Z79899 Other long term (current) drug therapy: Secondary | ICD-10-CM | POA: Diagnosis not present

## 2023-10-23 DIAGNOSIS — E7800 Pure hypercholesterolemia, unspecified: Secondary | ICD-10-CM | POA: Diagnosis present

## 2023-10-23 DIAGNOSIS — K219 Gastro-esophageal reflux disease without esophagitis: Secondary | ICD-10-CM | POA: Diagnosis present

## 2023-10-23 DIAGNOSIS — Z820 Family history of epilepsy and other diseases of the nervous system: Secondary | ICD-10-CM | POA: Diagnosis not present

## 2023-10-23 DIAGNOSIS — Z8673 Personal history of transient ischemic attack (TIA), and cerebral infarction without residual deficits: Secondary | ICD-10-CM | POA: Diagnosis not present

## 2023-10-23 DIAGNOSIS — Z833 Family history of diabetes mellitus: Secondary | ICD-10-CM | POA: Diagnosis not present

## 2023-10-23 DIAGNOSIS — Z8249 Family history of ischemic heart disease and other diseases of the circulatory system: Secondary | ICD-10-CM | POA: Diagnosis not present

## 2023-10-23 MED ORDER — OXYCODONE HCL 5 MG PO TABS: 5.0000 mg | ORAL_TABLET | Freq: Four times a day (QID) | ORAL | 0 refills | Status: AC | PRN

## 2023-10-23 MED ORDER — ASPIRIN 81 MG PO CHEW: 81.0000 mg | CHEWABLE_TABLET | Freq: Two times a day (BID) | ORAL | 0 refills | Status: AC

## 2023-10-23 NOTE — Progress Notes (Signed)
 Physical Therapy Treatment Patient Details Name: Kenneth Harper MRN: [id number] DOB: 1940-12-28 Today's Date: 10/23/2023   History of Present Illness Pt is an 83 year old male s/p L TKA on 10/21/23.  PMHx: memory loss, CVA, CTS, lumbar back surgery    PT Comments  Pt ambulated in hallway and practiced stair negotiation.  Pt hesitant about d/c home today stating his wife can't help him.  Pt not requiring physical assist to mobilize at this time.  Will continue to assist pt with safe mobility and hopefully improve his confidence about d/c home tomorrow.     If plan is discharge home, recommend the following: Help with stairs or ramp for entrance;Assistance with cooking/housework;Assist for transportation   Can travel by private vehicle        Equipment Recommendations  Rolling walker (2 wheels)    Recommendations for Other Services       Precautions / Restrictions Precautions Precautions: Knee;Fall Restrictions LLE Weight Bearing Per Provider Order: Weight bearing as tolerated     Mobility  Bed Mobility Overal bed mobility: Needs Assistance Bed Mobility: Supine to Sit     Supine to sit: Contact guard, HOB elevated     General bed mobility comments: verbal cues for sequence and technique    Transfers Overall transfer level: Needs assistance Equipment used: Rolling walker (2 wheels) Transfers: Sit to/from Stand Sit to Stand: Contact guard assist           General transfer comment: verbal cues for UE and LE positioning for pain control    Ambulation/Gait Ambulation/Gait assistance: Contact guard assist Gait Distance (Feet): 160 Feet Assistive device: Rolling walker (2 wheels) Gait Pattern/deviations: Step-to pattern, Decreased stance time - left, Antalgic Gait velocity: decr     General Gait Details: verbal cues for sequence, RW positioning, step length, posture   Stairs Stairs: Yes Stairs assistance: Contact guard assist Stair Management: Step to  pattern, Forwards, Two rails Number of Stairs: 2 General stair comments: verbal cues for sequence and safety   Wheelchair Mobility     Tilt Bed    Modified Rankin (Stroke Patients Only)       Balance                                            Communication Communication Communication: No apparent difficulties  Cognition Arousal: Alert Behavior During Therapy: WFL for tasks assessed/performed   PT - Cognitive impairments: No apparent impairments                         Following commands: Intact      Cueing    Exercises      General Comments        Pertinent Vitals/Pain Pain Assessment Pain Assessment: 0-10 Pain Score: 5  Pain Location: left knee and thigh Pain Descriptors / Indicators: Sore, Aching Pain Intervention(s): Monitored during session, Repositioned, Ice applied    Home Living                          Prior Function            PT Goals (current goals can now be found in the care plan section) Progress towards PT goals: Progressing toward goals    Frequency    7X/week      PT Plan  Co-evaluation              AM-PAC PT "6 Clicks" Mobility   Outcome Measure  Help needed turning from your back to your side while in a flat bed without using bedrails?: A Little Help needed moving from lying on your back to sitting on the side of a flat bed without using bedrails?: A Little Help needed moving to and from a bed to a chair (including a wheelchair)?: A Little Help needed standing up from a chair using your arms (e.g., wheelchair or bedside chair)?: A Little Help needed to walk in hospital room?: A Little Help needed climbing 3-5 steps with a railing? : A Little 6 Click Score: 18    End of Session Equipment Utilized During Treatment: Gait belt Activity Tolerance: Patient tolerated treatment well Patient left: in chair;with call bell/phone within reach;with chair alarm set Nurse  Communication: Mobility status PT Visit Diagnosis: Difficulty in walking, not elsewhere classified (R26.2)     Time: 1610-9604 PT Time Calculation (min) (ACUTE ONLY): 15 min  Charges:    $Gait Training: 8-22 mins PT General Charges $$ ACUTE PT VISIT: 1 Visit                    Blanch Bunde, DPT Physical Therapist Acute Rehabilitation Services Office: [phone]    Myna Asal Payson 10/23/2023, 3:32 PM

## 2023-10-23 NOTE — Progress Notes (Signed)
 Patient assisted from bed to bedside Chair using rolling walker; patient tolerated transfer well; sitting up in chair eating breakfast and with no complaints.

## 2023-10-23 NOTE — Progress Notes (Signed)
 Patient ID: Kenneth Harper, male   DOB: Jul 27, 1940, 83 y.o.   MRN: [id number] The patient's vital signs are stable.  His left operative knee is stable.  The dressing had some bloody drainage but I changed new dry dressing.  Physical therapy is at the bedside helping with his mobility.  He is 83 years old.  He really wants to stay even 1 more day to help maximize mobility.  Will make him an inpatient admission since he is staying another day.  Hopefully he will be able to have the confidence to go home tomorrow.

## 2023-10-23 NOTE — Progress Notes (Signed)
 Physical Therapy Treatment Patient Details Name: ORIEL RUMBOLD MRN: [id number] DOB: [birth year] Today's Date: 10/23/2023   History of Present Illness Pt is an 83 year old male s/p L TKA on 10/21/23.  PMHx: memory loss, CVA, CTS, lumbar back surgery    PT Comments  Pt performed LE exercises and then ambulated again in hallway this afternoon.  Pt progressing well and anticipate d/c home tomorrow.     If plan is discharge home, recommend the following: Help with stairs or ramp for entrance;Assistance with cooking/housework;Assist for transportation   Can travel by private vehicle        Equipment Recommendations  Rolling walker (2 wheels)    Recommendations for Other Services       Precautions / Restrictions Precautions Precautions: Knee;Fall Restrictions LLE Weight Bearing Per Provider Order: Weight bearing as tolerated     Mobility  Bed Mobility Overal bed mobility: Needs Assistance Bed Mobility: Supine to Sit, Sit to Supine     Supine to sit: Supervision Sit to supine: Supervision   General bed mobility comments: increased time and effort    Transfers Overall transfer level: Needs assistance Equipment used: Rolling walker (2 wheels) Transfers: Sit to/from Stand Sit to Stand: Contact guard assist           General transfer comment: verbal cues for UE and LE positioning for pain control    Ambulation/Gait Ambulation/Gait assistance: Contact guard assist Gait Distance (Feet): 160 Feet Assistive device: Rolling walker (2 wheels) Gait Pattern/deviations: Step-to pattern, Decreased stance time - left, Antalgic Gait velocity: decr     General Gait Details: verbal cues for sequence, RW positioning, step length, posture   Stairs    Wheelchair Mobility     Tilt Bed    Modified Rankin (Stroke Patients Only)       Balance                                            Communication Communication Communication: No apparent  difficulties  Cognition Arousal: Alert Behavior During Therapy: WFL for tasks assessed/performed   PT - Cognitive impairments: No apparent impairments                         Following commands: Intact      Cueing    Exercises Total Joint Exercises Ankle Circles/Pumps: AROM, 10 reps, Both Quad Sets: AROM, Both, 10 reps Hip ABduction/ADduction: AAROM, Left, 10 reps Straight Leg Raises: AAROM, Left, 10 reps Knee Flexion: AAROM, Left, 10 reps    General Comments        Pertinent Vitals/Pain Pain Assessment Pain Assessment: 0-10 Pain Score: 5  Pain Location: left knee and thigh Pain Descriptors / Indicators: Sore, Aching Pain Intervention(s): Monitored during session, Repositioned, Ice applied    Home Living                          Prior Function            PT Goals (current goals can now be found in the care plan section) Progress towards PT goals: Progressing toward goals    Frequency    7X/week      PT Plan      Co-evaluation              AM-PAC PT "6 Clicks" Mobility  Outcome Measure  Help needed turning from your back to your side while in a flat bed without using bedrails?: A Little Help needed moving from lying on your back to sitting on the side of a flat bed without using bedrails?: A Little Help needed moving to and from a bed to a chair (including a wheelchair)?: A Little Help needed standing up from a chair using your arms (e.g., wheelchair or bedside chair)?: A Little Help needed to walk in hospital room?: A Little Help needed climbing 3-5 steps with a railing? : A Little 6 Click Score: 18    End of Session Equipment Utilized During Treatment: Gait belt Activity Tolerance: Patient tolerated treatment well Patient left: in bed;with call bell/phone within reach;with bed alarm set Nurse Communication: Mobility status PT Visit Diagnosis: Difficulty in walking, not elsewhere classified (R26.2)     Time:  1356-1410 PT Time Calculation (min) (ACUTE ONLY): 14 min  Charges:     $Therapeutic Exercise: 8-22 mins PT General Charges $$ ACUTE PT VISIT: 1 Visit                     Blanch Bunde, DPT Physical Therapist Acute Rehabilitation Services Office: [phone]    Myna Asal Payson 10/23/2023, 3:37 PM

## 2023-10-24 NOTE — Progress Notes (Signed)
Patient discharged to home w/ family. Given all belongings, instructions, equipment. Verbalized understanding of instructions. Escorted to pov via w/c. 

## 2023-10-24 NOTE — Progress Notes (Signed)
 Patient is POD 3 s/p left total knee arthroplasty on 10/21/2023.  Overall he is doing well.  No chest pain or shortness of breath.  No calf pain.  He did well with physical therapy yesterday ambulating over 100 feet and navigating 2 stairs.  He has 3 stairs again to his house.  On exam, he has dressing intact with some bloody drainage in the midportion and distal aspect of the dressing.  No significant calf tenderness.  Negative Homans' sign.  Palp DP pulse.  Not able to perform straight leg raise yet.  Plan is work with physical therapy today to ensure that he is appropriate for discharge home.  They will work on stair training and anticipate discharge after his morning therapy.  Discharge orders placed and med rec completed.  Follow-up with Dr. Lucienne Ryder in 2 weeks.

## 2023-10-24 NOTE — Progress Notes (Signed)
 Physical Therapy Treatment Patient Details Name: Kenneth Harper MRN: [id number] DOB: 1941-01-11 Today's Date: 10/24/2023   History of Present Illness Pt is an 83 year old male s/p L TKA on 10/21/23.  PMHx: memory loss, CVA, CTS, lumbar back surgery    PT Comments  Pt progressing well. Amb incr distance this am with improved wt shift to LLE and incr stride length noted. Reviewed stairs again and pt able to return demo with supervision/CGA  with cues for sequence. Pt son present for session. Pt reports his dtr will be taking him home. Plan is for HHPT    If plan is discharge home, recommend the following: Help with stairs or ramp for entrance;Assistance with cooking/housework;Assist for transportation   Can travel by private vehicle        Equipment Recommendations  Rolling walker (2 wheels) (delivered)    Recommendations for Other Services       Precautions / Restrictions Precautions Precautions: Knee;Fall Precaution Booklet Issued: No Recall of Precautions/Restrictions: Intact Precaution/Restrictions Comments: able to perform SLR Required Braces or Orthoses: Knee Immobilizer - Left Knee Immobilizer - Left: Discontinue once straight leg raise with < 10 degree lag Restrictions Weight Bearing Restrictions Per Provider Order: No LLE Weight Bearing Per Provider Order: Weight bearing as tolerated     Mobility  Bed Mobility               General bed mobility comments: on EOB    Transfers Overall transfer level: Needs assistance Equipment used: Rolling walker (2 wheels) Transfers: Sit to/from Stand Sit to Stand: Contact guard assist, Supervision           General transfer comment: verbal cues for UE and LE positioning for pain control    Ambulation/Gait Ambulation/Gait assistance: Contact guard assist, Supervision Gait Distance (Feet): 250 Feet Assistive device: Rolling walker (2 wheels) Gait Pattern/deviations: Step-through pattern Gait velocity: decr      General Gait Details: cues for RW safety with turns, trunk extension and upward gaze. incr stride length with improved wt shift to LLE   Stairs Stairs: Yes Stairs assistance: Contact guard assist, Supervision Stair Management: Step to pattern, Forwards, Two rails Number of Stairs: 5 (x2) General stair comments: verbal cues for sequence and safety. good stability, assist only to take RW to top/bottom of stairs   Wheelchair Mobility     Tilt Bed    Modified Rankin (Stroke Patients Only)       Balance                                            Communication Communication Communication: No apparent difficulties  Cognition Arousal: Alert Behavior During Therapy: WFL for tasks assessed/performed   PT - Cognitive impairments: No apparent impairments                         Following commands: Intact      Cueing Cueing Techniques: Verbal cues  Exercises      General Comments        Pertinent Vitals/Pain Pain Assessment Pain Assessment: 0-10 Pain Score: 4  Pain Location: left knee and thigh Pain Descriptors / Indicators: Sore, Aching Pain Intervention(s): Limited activity within patient's tolerance, Monitored during session, Premedicated before session, Repositioned    Home Living  Prior Function            PT Goals (current goals can now be found in the care plan section) Acute Rehab PT Goals PT Goal Formulation: With patient Time For Goal Achievement: 11/04/23 Potential to Achieve Goals: Good Progress towards PT goals: Progressing toward goals    Frequency    7X/week      PT Plan      Co-evaluation              AM-PAC PT "6 Clicks" Mobility   Outcome Measure  Help needed turning from your back to your side while in a flat bed without using bedrails?: None Help needed moving from lying on your back to sitting on the side of a flat bed without using bedrails?: None Help  needed moving to and from a bed to a chair (including a wheelchair)?: A Little Help needed standing up from a chair using your arms (e.g., wheelchair or bedside chair)?: A Little Help needed to walk in hospital room?: A Little Help needed climbing 3-5 steps with a railing? : A Little 6 Click Score: 20    End of Session Equipment Utilized During Treatment: Gait belt Activity Tolerance: Patient tolerated treatment well Patient left: Other (comment);with call bell/phone within reach (bathroom) Nurse Communication: Mobility status PT Visit Diagnosis: Difficulty in walking, not elsewhere classified (R26.2)     Time: 1610-9604 PT Time Calculation (min) (ACUTE ONLY): 14 min  Charges:    $Gait Training: 8-22 mins PT General Charges $$ ACUTE PT VISIT: 1 Visit                     Tiffane Sheldon, PT  Acute Rehab Dept [facility]) [phone]  10/24/2023    [facility] 10/24/2023, 10:40 AM

## 2023-10-25 ENCOUNTER — Encounter (HOSPITAL_COMMUNITY): Payer: Self-pay | Admitting: Orthopaedic Surgery

## 2023-10-25 DIAGNOSIS — Z471 Aftercare following joint replacement surgery: Secondary | ICD-10-CM | POA: Diagnosis not present

## 2023-10-25 DIAGNOSIS — K219 Gastro-esophageal reflux disease without esophagitis: Secondary | ICD-10-CM | POA: Diagnosis not present

## 2023-10-25 DIAGNOSIS — M1711 Unilateral primary osteoarthritis, right knee: Secondary | ICD-10-CM | POA: Diagnosis not present

## 2023-10-25 DIAGNOSIS — F32A Depression, unspecified: Secondary | ICD-10-CM | POA: Diagnosis not present

## 2023-10-25 DIAGNOSIS — N400 Enlarged prostate without lower urinary tract symptoms: Secondary | ICD-10-CM | POA: Diagnosis not present

## 2023-10-25 DIAGNOSIS — G8929 Other chronic pain: Secondary | ICD-10-CM | POA: Diagnosis not present

## 2023-10-25 DIAGNOSIS — J45909 Unspecified asthma, uncomplicated: Secondary | ICD-10-CM | POA: Diagnosis not present

## 2023-10-25 DIAGNOSIS — M5116 Intervertebral disc disorders with radiculopathy, lumbar region: Secondary | ICD-10-CM | POA: Diagnosis not present

## 2023-10-25 DIAGNOSIS — I10 Essential (primary) hypertension: Secondary | ICD-10-CM | POA: Diagnosis not present

## 2023-10-26 ENCOUNTER — Other Ambulatory Visit: Payer: Self-pay | Admitting: Physician Assistant

## 2023-10-27 DIAGNOSIS — N400 Enlarged prostate without lower urinary tract symptoms: Secondary | ICD-10-CM | POA: Diagnosis not present

## 2023-10-27 DIAGNOSIS — G8929 Other chronic pain: Secondary | ICD-10-CM | POA: Diagnosis not present

## 2023-10-27 DIAGNOSIS — M1711 Unilateral primary osteoarthritis, right knee: Secondary | ICD-10-CM | POA: Diagnosis not present

## 2023-10-27 DIAGNOSIS — K219 Gastro-esophageal reflux disease without esophagitis: Secondary | ICD-10-CM | POA: Diagnosis not present

## 2023-10-27 DIAGNOSIS — J45909 Unspecified asthma, uncomplicated: Secondary | ICD-10-CM | POA: Diagnosis not present

## 2023-10-27 DIAGNOSIS — Z471 Aftercare following joint replacement surgery: Secondary | ICD-10-CM | POA: Diagnosis not present

## 2023-10-27 DIAGNOSIS — M5116 Intervertebral disc disorders with radiculopathy, lumbar region: Secondary | ICD-10-CM | POA: Diagnosis not present

## 2023-10-27 DIAGNOSIS — I10 Essential (primary) hypertension: Secondary | ICD-10-CM | POA: Diagnosis not present

## 2023-10-27 DIAGNOSIS — F32A Depression, unspecified: Secondary | ICD-10-CM | POA: Diagnosis not present

## 2023-10-29 DIAGNOSIS — M1711 Unilateral primary osteoarthritis, right knee: Secondary | ICD-10-CM | POA: Diagnosis not present

## 2023-10-29 DIAGNOSIS — G8929 Other chronic pain: Secondary | ICD-10-CM | POA: Diagnosis not present

## 2023-10-29 DIAGNOSIS — F32A Depression, unspecified: Secondary | ICD-10-CM | POA: Diagnosis not present

## 2023-10-29 DIAGNOSIS — M5116 Intervertebral disc disorders with radiculopathy, lumbar region: Secondary | ICD-10-CM | POA: Diagnosis not present

## 2023-10-29 DIAGNOSIS — I10 Essential (primary) hypertension: Secondary | ICD-10-CM | POA: Diagnosis not present

## 2023-10-29 DIAGNOSIS — Z471 Aftercare following joint replacement surgery: Secondary | ICD-10-CM | POA: Diagnosis not present

## 2023-10-29 DIAGNOSIS — K219 Gastro-esophageal reflux disease without esophagitis: Secondary | ICD-10-CM | POA: Diagnosis not present

## 2023-10-29 DIAGNOSIS — J45909 Unspecified asthma, uncomplicated: Secondary | ICD-10-CM | POA: Diagnosis not present

## 2023-10-29 DIAGNOSIS — N400 Enlarged prostate without lower urinary tract symptoms: Secondary | ICD-10-CM | POA: Diagnosis not present

## 2023-11-01 ENCOUNTER — Telehealth: Payer: Self-pay | Admitting: Orthopaedic Surgery

## 2023-11-01 DIAGNOSIS — M1711 Unilateral primary osteoarthritis, right knee: Secondary | ICD-10-CM | POA: Diagnosis not present

## 2023-11-01 DIAGNOSIS — G8929 Other chronic pain: Secondary | ICD-10-CM | POA: Diagnosis not present

## 2023-11-01 DIAGNOSIS — N400 Enlarged prostate without lower urinary tract symptoms: Secondary | ICD-10-CM | POA: Diagnosis not present

## 2023-11-01 DIAGNOSIS — M5116 Intervertebral disc disorders with radiculopathy, lumbar region: Secondary | ICD-10-CM | POA: Diagnosis not present

## 2023-11-01 DIAGNOSIS — I10 Essential (primary) hypertension: Secondary | ICD-10-CM | POA: Diagnosis not present

## 2023-11-01 DIAGNOSIS — K219 Gastro-esophageal reflux disease without esophagitis: Secondary | ICD-10-CM | POA: Diagnosis not present

## 2023-11-01 DIAGNOSIS — J45909 Unspecified asthma, uncomplicated: Secondary | ICD-10-CM | POA: Diagnosis not present

## 2023-11-01 DIAGNOSIS — F32A Depression, unspecified: Secondary | ICD-10-CM | POA: Diagnosis not present

## 2023-11-01 DIAGNOSIS — Z471 Aftercare following joint replacement surgery: Secondary | ICD-10-CM | POA: Diagnosis not present

## 2023-11-01 NOTE — Telephone Encounter (Signed)
 Mark called. He is the PT working with patient in home. He says patient has a bandage on and it has drainage from the surgical site. Looks fine. Some blood. Just wanted Lucienne Ryder to know. Mark's # [phone]

## 2023-11-01 NOTE — Telephone Encounter (Signed)
 noted

## 2023-11-03 ENCOUNTER — Ambulatory Visit: Payer: Medicare HMO | Admitting: Sports Medicine

## 2023-11-03 DIAGNOSIS — K219 Gastro-esophageal reflux disease without esophagitis: Secondary | ICD-10-CM | POA: Diagnosis not present

## 2023-11-03 DIAGNOSIS — Z471 Aftercare following joint replacement surgery: Secondary | ICD-10-CM | POA: Diagnosis not present

## 2023-11-03 DIAGNOSIS — I10 Essential (primary) hypertension: Secondary | ICD-10-CM | POA: Diagnosis not present

## 2023-11-03 DIAGNOSIS — J45909 Unspecified asthma, uncomplicated: Secondary | ICD-10-CM | POA: Diagnosis not present

## 2023-11-03 DIAGNOSIS — F32A Depression, unspecified: Secondary | ICD-10-CM | POA: Diagnosis not present

## 2023-11-03 DIAGNOSIS — N400 Enlarged prostate without lower urinary tract symptoms: Secondary | ICD-10-CM | POA: Diagnosis not present

## 2023-11-03 DIAGNOSIS — G8929 Other chronic pain: Secondary | ICD-10-CM | POA: Diagnosis not present

## 2023-11-03 DIAGNOSIS — M5116 Intervertebral disc disorders with radiculopathy, lumbar region: Secondary | ICD-10-CM | POA: Diagnosis not present

## 2023-11-03 DIAGNOSIS — M1711 Unilateral primary osteoarthritis, right knee: Secondary | ICD-10-CM | POA: Diagnosis not present

## 2023-11-04 ENCOUNTER — Ambulatory Visit: Admitting: Orthopaedic Surgery

## 2023-11-04 ENCOUNTER — Encounter: Payer: Self-pay | Admitting: Orthopaedic Surgery

## 2023-11-04 DIAGNOSIS — Z96652 Presence of left artificial knee joint: Secondary | ICD-10-CM

## 2023-11-04 NOTE — Progress Notes (Signed)
 The patient is a 83 year old gentleman who is here for his first postoperative visit status post a left total knee replacement.  He says he is doing well overall.  Has been compliant with a baby aspirin  twice daily.  Staples are removed from his left knee incision and Steri-Strips applied.  His extension is almost full and his flexion is to about 80 to 85 degrees.  His calf is soft.  He has been wearing compressive hose as well.  He has not been taking a lot of pain medication.  He would like to drive which I think is fine if he goes out and practices and is not taking narcotics but it sounds like he is not taking a lot of narcotics.  He starts outpatient physical therapy on Monday upstairs.  He can stop his baby aspirin  twice daily and if he continues with foot and ankle swelling should still wear the TED hose.  We will see him back in 4 weeks to see how he is doing overall but no x-rays are needed.

## 2023-11-08 ENCOUNTER — Encounter: Payer: Self-pay | Admitting: Physical Therapy

## 2023-11-08 ENCOUNTER — Ambulatory Visit: Admitting: Physical Therapy

## 2023-11-08 DIAGNOSIS — M6281 Muscle weakness (generalized): Secondary | ICD-10-CM | POA: Diagnosis not present

## 2023-11-08 DIAGNOSIS — R600 Localized edema: Secondary | ICD-10-CM | POA: Diagnosis not present

## 2023-11-08 DIAGNOSIS — M25562 Pain in left knee: Secondary | ICD-10-CM | POA: Diagnosis not present

## 2023-11-08 NOTE — Therapy (Signed)
 OUTPATIENT PHYSICAL THERAPY LOWER EXTREMITY EVALUATION Referring diagnosis? W09.811 (ICD-10-CM) - Status post total left knee replacement Treatment diagnosis? (if different than referring diagnosis) m25.562 What was this (referring dx) caused by? [x]  Surgery []  Fall []  Ongoing issue []  Arthritis []  Other: ____________  Laterality: []  Rt [x]  Lt []  Both  Check all possible CPT codes:  *CHOOSE 10 OR LESS*    See Planned Interventions listed in the Plan section of the Evaluation.    Patient Name: Kenneth Harper MRN: [id number] DOB:11/09/1940, 83 y.o., male Today's Date: 11/08/2023  END OF SESSION:  PT End of Session - 11/08/23 1422     Visit Number 1    Number of Visits 10    Date for PT Re-Evaluation 12/20/23    Authorization Type [employer]    PT Start Time 1345    PT Stop Time 1425    PT Time Calculation (min) 40 min    Activity Tolerance Patient tolerated treatment well    Behavior During Therapy [facility] for tasks assessed/performed             Past Medical History:  Diagnosis Date   Arthritis    knees and back   Asthma    childhood   Carpal tunnel syndrome    Cataract    Diverticulitis    GERD (gastroesophageal reflux disease)    Hypercholesterolemia    Memory loss    Stroke (HCC) 07/31/2020   Past Surgical History:  Procedure Laterality Date   BILATERAL CARPAL TUNNEL RELEASE     CATARACT EXTRACTION, BILATERAL Bilateral    COLONOSCOPY WITH PROPOFOL  N/A 10/29/2014   Procedure: COLONOSCOPY WITH PROPOFOL ;  Surgeon: Garrett Kallman, MD;  Location: [facility];  Service: Endoscopy;  Laterality: N/A;   EYE SURGERY     LUMBAR LAMINECTOMY/DECOMPRESSION MICRODISCECTOMY N/A 10/01/2015   Procedure: LEFT L2-3 MICRODISCECTOMY;  Surgeon: Alphonso Jean, MD;  Location: [facility] OR;  Service: Orthopedics;  Laterality: N/A;   TOTAL KNEE ARTHROPLASTY Left 10/21/2023   Procedure: ARTHROPLASTY, KNEE, TOTAL;  Surgeon: Arnie Lao, MD;  Location: [facility];  Service:  Orthopedics;  Laterality: Left;   Patient Active Problem List   Diagnosis Date Noted   Status post total left knee replacement 10/21/2023   Unilateral primary osteoarthritis, left knee 10/20/2023   Essential hypertension 08/13/2020   Transaminitis    Steroid-induced hyperglycemia    Prediabetes    Contusion of right knee    Thalamic hemorrhage with stroke (HCC) 08/06/2020   Hemorrhagic stroke (HCC)    Memory loss    Dyslipidemia    Elevated blood pressure reading    AKI (acute kidney injury) (HCC)    Primary osteoarthritis of right knee    ICH (intracerebral hemorrhage) (HCC) 07/31/2020   Bilateral primary osteoarthritis of knee 08/10/2019   Carpal tunnel syndrome on both sides 07/28/2018   Lumbar herniated disc 10/01/2015   Herniation of lumbar intervertebral disc with radiculopathy 09/30/2015    Class: Acute    PCP: Jearldine Mina, MD   REFERRING PROVIDER: Arnie Lao*   REFERRING DIAG:  [phone] (ICD-10-CM) - Status post total left knee replacement  M17.12 (ICD-10-CM) - Unilateral primary osteoarthritis, left knee    THERAPY DIAG:  Acute pain of left knee  Muscle weakness (generalized)  Localized edema  Rationale for Evaluation and Treatment: Rehabilitation  ONSET DATE: S/P Left TKA on 10/21/23   SUBJECTIVE:   SUBJECTIVE STATEMENT:He relays doing well after surgery, does have some discomfort sleeping or if he has his leg  in one position too long, not really having pain today.  PERTINENT HISTORY: S/P Left TKA on 10/21/23  PAIN:  NPRS scale: /10 upon arrival Pain location:Lt knee Pain description: constant Aggravating factors:  Relieving factors: rest, meds   PRECAUTIONS: None  RED FLAGS: None   WEIGHT BEARING RESTRICTIONS: No  FALLS:  Has patient fallen in last 6 months? No  LIVING ENVIRONMENT:3 steps to enter from garage, has to do these one at a time for now  OCCUPATION: retired  PLOF: Independent  PATIENT GOALS: get back to  fishing  NEXT MD VISIT: 12/01/23  OBJECTIVE:  Note: Objective measures were completed at Evaluation unless otherwise noted.  PATIENT SURVEYS:  Patient-Specific Activity Scoring Scheme  "0" represents "unable to perform." "10" represents "able to perform at prior level. 0 1 2 3 4 5 6 7 8 9  10 (Date and Score)   Activity Eval     1. stairs  7                      Score 7    Total score = sum of the activity scores/number of activities Minimum detectable change (90%CI) for average score = 2 points Minimum detectable change (90%CI) for single activity score = 3 points  EDEMA:  Mild edema in left knee at eval   LOWER EXTREMITY ROM:   ROM Left eval   Hip flexion    Hip extension    Hip abduction    Hip adduction    Hip internal rotation    Hip external rotation    Knee flexion A:95 P:100   Knee extension A:10 P:5   Ankle dorsiflexion    Ankle plantarflexion    Ankle inversion    Ankle eversion     (Blank rows = not tested)  LOWER EXTREMITY MMT:  MMT Right eval Left eval  Hip flexion  5  Hip extension    Hip abduction    Hip adduction    Hip internal rotation    Hip external rotation    Knee flexion  5  Knee extension 4/5 42#, 49# 4/5 32#, 32#  Ankle dorsiflexion    Ankle plantarflexion    Ankle inversion    Ankle eversion     (Blank rows = not tested  FUNCTIONAL TESTS:  30 second sit to stand test 7 reps in 30 sec, no UE support from standard chair  GAIT: Eval Comments: ambulates into clinic with SPC, relays he can walk in home without AD and did not use one PLOF   TODAY'S TREATMENT:  Eval Thererex HEP creation and review with demonstration and trial set preformed, see below for details  Manual therapy Left knee PROM with overpressure into flexion to tolerance    PATIENT EDUCATION: Education details: HEP, PT plan of care Person educated: Patient Education method: Explanation, Demonstration, Verbal cues, and Handouts Education  comprehension: verbalized understanding and needs further education   HOME EXERCISE PROGRAM: Access Code: [email or web address] URL: [email or web address] Date: 11/08/2023 Prepared by: Jamee Mazzoni  Exercises - Supine Quad Set  - 3 x daily - 6 x weekly - 1 sets - 10 reps - 5 sec hold - Supine Hamstring Stretch with Strap  - 3 x daily - 6 x weekly - 1 sets - 3 reps - 30 sec hold - Supine Heel Slide with Strap  - 3 x daily - 6 x weekly - 10 reps - 5 sec hold - Seated Knee Flexion Extension AAROM with Overpressure  -  3 x daily - 6 x weekly - 1 sets - 10 reps - 5 sec hold - Seated Straight Leg Raise with Quad Contraction  - 3 x daily - 6 x weekly - 1 sets - 10 reps - Sit to Stand Without Arm Support  - 3 x daily - 6 x weekly - 1-2 sets - 10 reps  ASSESSMENT:  CLINICAL IMPRESSION: Patient referred to PT S/P Left TKA on 10/21/23. Overall he is doing quite well up to this point but will benefit from skilled PT to address below impairments, limitations and improve overall function.  OBJECTIVE IMPAIRMENTS: decreased activity tolerance, difficulty walking, decreased balance, decreased endurance, decreased mobility, decreased ROM, decreased strength, impaired flexibility, impaired LE use, and pain.  ACTIVITY LIMITATIONS: bending, lifting, carry, locomotion, cleaning, community activity, stairs  PERSONAL FACTORS: post op status, are also affecting patient's functional outcome.  REHAB POTENTIAL: Good  CLINICAL DECISION MAKING: Stable/uncomplicated  EVALUATION COMPLEXITY: Low    GOALS: Short term PT Goals Target date: 12/06/2023   Pt will be I and compliant with HEP. Baseline:  Goal status: New   Long term PT goals Target date:12/20/2023   Pt will improve left knee PROM to 0-120 deg and AROM 0-110 deg to improve functional mobility Baseline: Goal status: New Pt will improve  hip/knee strength to at least 5/5 MMT or >50# on HHD for knee extensor strength to improve functional  strength Baseline: Goal status: New Pt will improve PSFS to at least 9/10 functional to show improved function Baseline: Goal status: New Pt will improve 30 second sit to stand test to 10 reps in 30 sec, no UE support from standard chair, to show improved functional strength Baseline: Goal status: New Pt will reduce pain to overall less than 2-3/10 with usual activity Baseline: Goal status: New  PLAN: PT FREQUENCY: 1-3 times per week   PT DURATION: 4-6 weeks  PLANNED INTERVENTIONS (unless contraindicated):  97110-Therapeutic exercises, 97530- Therapeutic activity, 97112- Neuromuscular re-education, 97535- Self Care, and 09811- Manual therapy vasopnumatic device, PLAN FOR NEXT SESSION: review HEP, knee ROM and quad strength    Mick Alamin, PT,DPT 11/08/2023, 2:23 PM

## 2023-11-09 ENCOUNTER — Encounter: Payer: Self-pay | Admitting: Physical Therapy

## 2023-11-09 ENCOUNTER — Ambulatory Visit: Admitting: Physical Therapy

## 2023-11-09 DIAGNOSIS — M6281 Muscle weakness (generalized): Secondary | ICD-10-CM | POA: Diagnosis not present

## 2023-11-09 DIAGNOSIS — R600 Localized edema: Secondary | ICD-10-CM

## 2023-11-09 DIAGNOSIS — M25562 Pain in left knee: Secondary | ICD-10-CM | POA: Diagnosis not present

## 2023-11-09 NOTE — Therapy (Signed)
 OUTPATIENT PHYSICAL THERAPY LOWER EXTREMITY   Referring diagnosis? R60.454 (ICD-10-CM) - Status post total left knee replacement Treatment diagnosis? (if different than referring diagnosis) m25.562 What was this (referring dx) caused by? [x]  Surgery []  Fall []  Ongoing issue []  Arthritis []  Other: ____________  Laterality: []  Rt [x]  Lt []  Both  Check all possible CPT codes:  *CHOOSE 10 OR LESS*    See Planned Interventions listed in the Plan section of the Evaluation.    Patient Name: Kenneth Harper MRN: [id number] DOB:[birth year], 83 y.o., male Today's Date: 11/09/2023  END OF SESSION:  PT End of Session - 11/09/23 1209     Visit Number 2    Number of Visits 10    Date for PT Re-Evaluation 12/20/23    Authorization Type [employer]    PT Start Time 1145    PT Stop Time 1230    PT Time Calculation (min) 45 min    Activity Tolerance Patient tolerated treatment well    Behavior During Therapy [facility] for tasks assessed/performed              Past Medical History:  Diagnosis Date   Arthritis    knees and back   Asthma    childhood   Carpal tunnel syndrome    Cataract    Diverticulitis    GERD (gastroesophageal reflux disease)    Hypercholesterolemia    Memory loss    Stroke (HCC) 07/31/2020   Past Surgical History:  Procedure Laterality Date   BILATERAL CARPAL TUNNEL RELEASE     CATARACT EXTRACTION, BILATERAL Bilateral    COLONOSCOPY WITH PROPOFOL  N/A 10/29/2014   Procedure: COLONOSCOPY WITH PROPOFOL ;  Surgeon: Garrett Kallman, MD;  Location: [facility];  Service: Endoscopy;  Laterality: N/A;   EYE SURGERY     LUMBAR LAMINECTOMY/DECOMPRESSION MICRODISCECTOMY N/A 10/01/2015   Procedure: LEFT L2-3 MICRODISCECTOMY;  Surgeon: Alphonso Jean, MD;  Location: [facility];  Service: Orthopedics;  Laterality: N/A;   TOTAL KNEE ARTHROPLASTY Left 10/21/2023   Procedure: ARTHROPLASTY, KNEE, TOTAL;  Surgeon: Arnie Lao, MD;  Location: [facility];  Service: Orthopedics;   Laterality: Left;   Patient Active Problem List   Diagnosis Date Noted   Status post total left knee replacement 10/21/2023   Unilateral primary osteoarthritis, left knee 10/20/2023   Essential hypertension 08/13/2020   Transaminitis    Steroid-induced hyperglycemia    Prediabetes    Contusion of right knee    Thalamic hemorrhage with stroke (HCC) 08/06/2020   Hemorrhagic stroke (HCC)    Memory loss    Dyslipidemia    Elevated blood pressure reading    AKI (acute kidney injury) (HCC)    Primary osteoarthritis of right knee    ICH (intracerebral hemorrhage) (HCC) 07/31/2020   Bilateral primary osteoarthritis of knee 08/10/2019   Carpal tunnel syndrome on both sides 07/28/2018   Lumbar herniated disc 10/01/2015   Herniation of lumbar intervertebral disc with radiculopathy 09/30/2015    Class: Acute    PCP: Jearldine Mina, MD   REFERRING PROVIDER: Arnie Lao*   REFERRING DIAG:  [phone] (ICD-10-CM) - Status post total left knee replacement  M17.12 (ICD-10-CM) - Unilateral primary osteoarthritis, left knee    THERAPY DIAG:  Acute pain of left knee  Muscle weakness (generalized)  Localized edema  Rationale for Evaluation and Treatment: Rehabilitation  ONSET DATE: S/P Left TKA on 10/21/23   SUBJECTIVE:   SUBJECTIVE STATEMENT: Pt reporting compliance in his HEP and 2/10 pain in his left knee. Pt  amb into clinic with a straight cane  PERTINENT HISTORY: S/P Left TKA on 10/21/23   PAIN:  NPRS scale: 2/10 upon arrival Pain location:Lt knee Pain description: constant Aggravating factors:  Relieving factors: rest, meds   PRECAUTIONS: None  RED FLAGS: None   WEIGHT BEARING RESTRICTIONS: No  FALLS:  Has patient fallen in last 6 months? No  LIVING ENVIRONMENT:3 steps to enter from garage, has to do these one at a time for now  OCCUPATION: retired  PLOF: Independent  PATIENT GOALS: get back to fishing  NEXT MD VISIT: 12/01/23  OBJECTIVE:   Note: Objective measures were completed at Evaluation unless otherwise noted.  PATIENT SURVEYS:  Patient-Specific Activity Scoring Scheme  "0" represents "unable to perform." "10" represents "able to perform at prior level. 0 1 2 3 4 5 6 7 8 9  10 (Date and Score)   Activity Eval     1. stairs  7                     Score 7    Total score = sum of the activity scores/number of activities Minimum detectable change (90%CI) for average score = 2 points Minimum detectable change (90%CI) for single activity score = 3 points  EDEMA:  Mild edema in left knee at eval   LOWER EXTREMITY ROM:   ROM Left eval 11/09/23  Hip flexion    Hip extension    Hip abduction    Hip adduction    Hip internal rotation    Hip external rotation    Knee flexion A:95 P:100 A: 100 P: 102  Knee extension A:10 P:5 A: 6 P; 5  Ankle dorsiflexion    Ankle plantarflexion    Ankle inversion    Ankle eversion     (Blank rows = not tested)  LOWER EXTREMITY MMT:  MMT Right eval Left eval  Hip flexion  5  Hip extension    Hip abduction    Hip adduction    Hip internal rotation    Hip external rotation    Knee flexion  5  Knee extension 4/5 42#, 49# 4/5 32#, 32#  Ankle dorsiflexion    Ankle plantarflexion    Ankle inversion    Ankle eversion     (Blank rows = not tested  FUNCTIONAL TESTS:  Eval:  30 second sit to stand test 7 reps in 30 sec, no UE support from standard chair  GAIT: Eval Comments: ambulates into clinic with SPC, relays he can walk in home without AD and did not use one PLOF   TODAY'S TREATMENT:  11/09/23 TherEx:  Nustep: level 5 x 8 minutes UE/LE seat at 9, arms 10 Seated LAQ: 2 x 10 3# left LE Seated SLR: 2 x 10 left LE TherActivites:  Sit to stand: 2 x10 Leg Press: 62# bil LE 2 x 10 ,  left LE only: 31#  x 10  Manual:  Sitting knee flexion to pt's tolerance Supine: knee extension with overpressure during quad sets x 10 holding 5-10 sec to pt's  tolerance Modalities:  Vasopneumatic: medium compression, 34 deg, x 10 minutes    Eval Thererex HEP creation and review with demonstration and trial set preformed, see below for details  Manual therapy Left knee PROM with overpressure into flexion to tolerance     PATIENT EDUCATION: Education details: HEP, PT plan of care Person educated: Patient Education method: Explanation, Demonstration, Verbal cues, and Handouts Education comprehension: verbalized understanding and needs further  education   HOME EXERCISE PROGRAM: Access Code: [email or web address] URL: [email or web address] Date: 11/08/2023 Prepared by: Jamee Mazzoni  Exercises - Supine Quad Set  - 3 x daily - 6 x weekly - 1 sets - 10 reps - 5 sec hold - Supine Hamstring Stretch with Strap  - 3 x daily - 6 x weekly - 1 sets - 3 reps - 30 sec hold - Supine Heel Slide with Strap  - 3 x daily - 6 x weekly - 10 reps - 5 sec hold - Seated Knee Flexion Extension AAROM with Overpressure  - 3 x daily - 6 x weekly - 1 sets - 10 reps - 5 sec hold - Seated Straight Leg Raise with Quad Contraction  - 3 x daily - 6 x weekly - 1 sets - 10 reps - Sit to Stand Without Arm Support  - 3 x daily - 6 x weekly - 1-2 sets - 10 reps  ASSESSMENT:  CLINICAL IMPRESSION:  Pt arriving today reporting 2/10 pain in his left knee. Pt tolerating exercises well today with no reports of increased pain. Recommending continued skilled PT interventions.    OBJECTIVE IMPAIRMENTS: decreased activity tolerance, difficulty walking, decreased balance, decreased endurance, decreased mobility, decreased ROM, decreased strength, impaired flexibility, impaired LE use, and pain.  ACTIVITY LIMITATIONS: bending, lifting, carry, locomotion, cleaning, community activity, stairs  PERSONAL FACTORS: post op status, are also affecting patient's functional outcome.  REHAB POTENTIAL: Good  CLINICAL DECISION MAKING: Stable/uncomplicated  EVALUATION COMPLEXITY:  Low    GOALS: Short term PT Goals Target date: 12/06/2023   Pt will be I and compliant with HEP. Baseline:  Goal status: on-going 11/09/23   Long term PT goals Target date:12/20/2023   Pt will improve left knee PROM to 0-120 deg and AROM 0-110 deg to improve functional mobility Baseline: Goal status: New Pt will improve  hip/knee strength to at least 5/5 MMT or >50# on HHD for knee extensor strength to improve functional strength Baseline: Goal status: New Pt will improve PSFS to at least 9/10 functional to show improved function Baseline: Goal status: New Pt will improve 30 second sit to stand test to 10 reps in 30 sec, no UE support from standard chair, to show improved functional strength Baseline: Goal status: New Pt will reduce pain to overall less than 2-3/10 with usual activity Baseline: Goal status: New  PLAN: PT FREQUENCY: 1-3 times per week   PT DURATION: 4-6 weeks  PLANNED INTERVENTIONS (unless contraindicated):  97110-Therapeutic exercises, 97530- Therapeutic activity, 97112- Neuromuscular re-education, 97535- Self Care, and [id number]- Manual therapy vasopnumatic device  PLAN FOR NEXT SESSION: knee ROM and quad strength, gait, functional mobility    Marysue Sola, PT, MPT 11/09/2023, 12:11 PM

## 2023-11-12 ENCOUNTER — Other Ambulatory Visit: Payer: Self-pay | Admitting: Physician Assistant

## 2023-11-16 ENCOUNTER — Encounter: Admitting: Physical Therapy

## 2023-11-17 ENCOUNTER — Ambulatory Visit: Admitting: Physical Therapy

## 2023-11-17 ENCOUNTER — Encounter: Payer: Self-pay | Admitting: Physical Therapy

## 2023-11-17 DIAGNOSIS — M6281 Muscle weakness (generalized): Secondary | ICD-10-CM

## 2023-11-17 DIAGNOSIS — M25562 Pain in left knee: Secondary | ICD-10-CM

## 2023-11-17 DIAGNOSIS — R600 Localized edema: Secondary | ICD-10-CM | POA: Diagnosis not present

## 2023-11-17 NOTE — Therapy (Signed)
 OUTPATIENT PHYSICAL THERAPY LOWER EXTREMITY   Referring diagnosis? Z61.096 (ICD-10-CM) - Status post total left knee replacement Treatment diagnosis? (if different than referring diagnosis) m25.562 What was this (referring dx) caused by? [x]  Surgery []  Fall []  Ongoing issue []  Arthritis []  Other: ____________  Laterality: []  Rt [x]  Lt []  Both  Check all possible CPT codes:  *CHOOSE 10 OR LESS*    See Planned Interventions listed in the Plan section of the Evaluation.    Patient Name: Kenneth Harper MRN: [id number] DOB:07/03/1941, 83 y.o., male Today's Date: 11/17/2023  END OF SESSION:  PT End of Session - 11/17/23 0934     Visit Number 3    Number of Visits 10    Date for PT Re-Evaluation 12/20/23    Authorization Type [employer]    Authorization Time Period $25 COPAY  Authorization #[id number]  Tracking #[id number]  11/08/2023-12/20/2023    PT Start Time 0930    PT Stop Time 1026    PT Time Calculation (min) 56 min    Activity Tolerance Patient tolerated treatment well    Behavior During Therapy [facility] for tasks assessed/performed               Past Medical History:  Diagnosis Date   Arthritis    knees and back   Asthma    childhood   Carpal tunnel syndrome    Cataract    Diverticulitis    GERD (gastroesophageal reflux disease)    Hypercholesterolemia    Memory loss    Stroke (HCC) 07/31/2020   Past Surgical History:  Procedure Laterality Date   BILATERAL CARPAL TUNNEL RELEASE     CATARACT EXTRACTION, BILATERAL Bilateral    COLONOSCOPY WITH PROPOFOL  N/A 10/29/2014   Procedure: COLONOSCOPY WITH PROPOFOL ;  Surgeon: Garrett Kallman, MD;  Location: [facility];  Service: Endoscopy;  Laterality: N/A;   EYE SURGERY     LUMBAR LAMINECTOMY/DECOMPRESSION MICRODISCECTOMY N/A 10/01/2015   Procedure: LEFT L2-3 MICRODISCECTOMY;  Surgeon: Alphonso Jean, MD;  Location: [facility];  Service: Orthopedics;  Laterality: N/A;   TOTAL KNEE ARTHROPLASTY Left 10/21/2023   Procedure:  ARTHROPLASTY, KNEE, TOTAL;  Surgeon: Arnie Lao, MD;  Location: [facility];  Service: Orthopedics;  Laterality: Left;   Patient Active Problem List   Diagnosis Date Noted   Status post total left knee replacement 10/21/2023   Unilateral primary osteoarthritis, left knee 10/20/2023   Essential hypertension 08/13/2020   Transaminitis    Steroid-induced hyperglycemia    Prediabetes    Contusion of right knee    Thalamic hemorrhage with stroke (HCC) 08/06/2020   Hemorrhagic stroke (HCC)    Memory loss    Dyslipidemia    Elevated blood pressure reading    AKI (acute kidney injury) (HCC)    Primary osteoarthritis of right knee    ICH (intracerebral hemorrhage) (HCC) 07/31/2020   Bilateral primary osteoarthritis of knee 08/10/2019   Carpal tunnel syndrome on both sides 07/28/2018   Lumbar herniated disc 10/01/2015   Herniation of lumbar intervertebral disc with radiculopathy 09/30/2015    Class: Acute    PCP: Jearldine Mina, MD   REFERRING PROVIDER: Arnie Lao*   REFERRING DIAG:  [phone] (ICD-10-CM) - Status post total left knee replacement  M17.12 (ICD-10-CM) - Unilateral primary osteoarthritis, left knee    THERAPY DIAG:  Acute pain of left knee  Muscle weakness (generalized)  Localized edema  Rationale for Evaluation and Treatment: Rehabilitation  ONSET DATE: S/P Left TKA on 10/21/23   SUBJECTIVE:  SUBJECTIVE STATEMENT: The exercises are going well.  His sleep is still restless. He is typically side sleeper but is on his back for knee now.   PERTINENT HISTORY: S/P Left TKA on 10/21/23, OA, HTN, Thalamic hemorrhage stroke, memory loss, lumbar herniated disc with laminectomy,   PAIN:  NPRS scale: at rest 1/10 with movement 6/10 Pain location:Lt knee Pain description: constant Aggravating factors:  Relieving factors: rest, meds  PRECAUTIONS: None  RED FLAGS: None   WEIGHT BEARING RESTRICTIONS: No  FALLS:  Has patient fallen in last  6 months? No  LIVING ENVIRONMENT:3 steps to enter from garage, has to do these one at a time for now  OCCUPATION: retired  PLOF: Independent  PATIENT GOALS: get back to fishing  NEXT MD VISIT: 12/01/23  OBJECTIVE:  Note: Objective measures were completed at Evaluation unless otherwise noted.  PATIENT SURVEYS:  Patient-Specific Activity Scoring Scheme  "0" represents "unable to perform." "10" represents "able to perform at prior level. 0 1 2 3 4 5 6 7 8 9  10 (Date and Score)   Activity Eval     1. stairs  7                     Score 7    Total score = sum of the activity scores/number of activities Minimum detectable change (90%CI) for average score = 2 points Minimum detectable change (90%CI) for single activity score = 3 points  EDEMA:  Mild edema in left knee at eval   LOWER EXTREMITY ROM:   ROM Left eval 11/09/23  Hip flexion    Hip extension    Hip abduction    Hip adduction    Hip internal rotation    Hip external rotation    Knee flexion A:95 P:100 A: 100 P: 102  Knee extension A:10 P:5 A: 6 P; 5  Ankle dorsiflexion    Ankle plantarflexion    Ankle inversion    Ankle eversion     (Blank rows = not tested)  LOWER EXTREMITY MMT:  MMT Right eval Left eval  Hip flexion  5  Hip extension    Hip abduction    Hip adduction    Hip internal rotation    Hip external rotation    Knee flexion  5  Knee extension 4/5 42#, 49# 4/5 32#, 32#  Ankle dorsiflexion    Ankle plantarflexion    Ankle inversion    Ankle eversion     (Blank rows = not tested  FUNCTIONAL TESTS:  Eval:  30 second sit to stand test 7 reps in 30 sec, no UE support from standard chair  GAIT: Eval Comments: ambulates into clinic with SPC, relays he can walk in home without AD and did not use one PLOF   TODAY'S TREATMENT:  11/17/2023 Therapeutic Exercise:  Recumbent bike seat 6 rocking flexion stretch 2.5 min, full revolutions backwards 1.5 minutes, then forward full  revolutions level 1 4 min Gastroc stretch step forefoot weight bearing 30 sec 3 reps Hamstring stretch 1 rep seated edge of chair and 1 rep LLE long sitting - strap DF 30 sec hold LAQ LLE 10 reps  Therapeutic Activities: PT demo & verbal cues weight shift upon arising to load knee.  Pt return demo 5 reps. Gait initiated with less decreased stance duration.  Leg press BLEs 75# 15 reps cues on max ext & flex;  single LLE 43# 10 reps 2 sets Tandem stance 30 sec LLE in front &  in back; 1st set floor eyes open with only 1-2 touches; 2nd set floor  Manual Therapy: PROM with overpressure seated knee flexion   Vaso: Vasopneumatic: medium compression, 34 deg, x 10 minutes with elevation.     TREATMENT:  11/09/23 TherEx:  Nustep: level 5 x 8 minutes UE/LE seat at 9, arms 10 Seated LAQ: 2 x 10 3# left LE Seated SLR: 2 x 10 left LE TherActivites:  Sit to stand: 2 x10 Leg Press: 62# bil LE 2 x 10 ,  left LE only: 31#  x 10  Manual:  Sitting knee flexion to pt's tolerance Supine: knee extension with overpressure during quad sets x 10 holding 5-10 sec to pt's tolerance Modalities:  Vasopneumatic: medium compression, 34 deg, x 10 minutes    Eval Thererex HEP creation and review with demonstration and trial set preformed, see below for details  Manual therapy Left knee PROM with overpressure into flexion to tolerance     PATIENT EDUCATION: Education details: HEP, PT plan of care Person educated: Patient Education method: Explanation, Demonstration, Verbal cues, and Handouts Education comprehension: verbalized understanding and needs further education   HOME EXERCISE PROGRAM: Access Code: [email or web address] URL: [email or web address] Date: 11/17/2023 Prepared by: Lorie Rook  Exercises - Supine Quad Set  - 3 x daily - 6 x weekly - 1 sets - 10 reps - 5 sec hold - Supine Hamstring Stretch with Strap  - 3 x daily - 6 x weekly - 1 sets - 3 reps - 30 sec hold - Supine Heel  Slide with Strap  - 3 x daily - 6 x weekly - 10 reps - 5 sec hold - Seated Knee Flexion Extension AAROM with Overpressure  - 3 x daily - 6 x weekly - 1 sets - 10 reps - 5 sec hold - Seated Straight Leg Raise with Quad Contraction  - 3 x daily - 6 x weekly - 1 sets - 10 reps - Sit to Stand Without Arm Support  - 3 x daily - 6 x weekly - 1-2 sets - 10 reps - standing calf stretch with forefoot on small step or brick  - 1-2 x daily - 7 x weekly - 1 sets - 3 reps - 30 seconds hold - Tandem Stance  - 1 x daily - 7 x weekly - 3 sets - 2 reps - 30 seconds hold - Seated Table Hamstring Stretch  - 1-2 x daily - 7 x weekly - 1 sets - 3 reps - 30 seconds hold   ASSESSMENT:  CLINICAL IMPRESSION:  Patient is slowly improving his functional range with progressive exercises, therapeutic activities and manual therapy.  Patient appears to understand updated HEP.  Patient continues to benefit from skilled PT to improve mobility status post total knee arthroplasty.  OBJECTIVE IMPAIRMENTS: decreased activity tolerance, difficulty walking, decreased balance, decreased endurance, decreased mobility, decreased ROM, decreased strength, impaired flexibility, impaired LE use, and pain.  ACTIVITY LIMITATIONS: bending, lifting, carry, locomotion, cleaning, community activity, stairs  PERSONAL FACTORS: post op status, are also affecting patient's functional outcome.  REHAB POTENTIAL: Good  CLINICAL DECISION MAKING: Stable/uncomplicated  EVALUATION COMPLEXITY: Low    GOALS: Short term PT Goals Target date: 12/06/2023   Pt will be I and compliant with HEP. Baseline:  Goal status: Ongoing  11/17/2023   Long term PT goals Target date:  12/20/2023   Pt will improve left knee PROM to 0-120 deg and AROM 0-110 deg to improve functional mobility Baseline: Goal status: Ongoing  11/17/2023  Pt will improve  hip/knee strength to at least 5/5 MMT or >50# on HHD for knee extensor strength to improve functional  strength Baseline: Goal status: Ongoing  11/17/2023 Pt will improve PSFS to at least 9/10 functional to show improved function Baseline: Goal status: Ongoing  11/17/2023 Pt will improve 30 second sit to stand test to 10 reps in 30 sec, no UE support from standard chair, to show improved functional strength Baseline: Goal status: Ongoing  11/17/2023 Pt will reduce pain to overall less than 2-3/10 with usual activity Baseline: Goal status: Ongoing  11/17/2023  PLAN: PT FREQUENCY: 1-3 times per week   PT DURATION: 4-6 weeks  PLANNED INTERVENTIONS (unless contraindicated):  97110-Therapeutic exercises, 97530- Therapeutic activity, 97112- Neuromuscular re-education, 97535- Self Care, and 97140- Manual therapy vasopnumatic device  PLAN FOR NEXT SESSION: Check range of motion, add muscle stretch for quad to HEP, knee ROM and quad strength, gait, functional mobility    Lorie Rook, PT, DPT 11/17/2023, 4:40 PM

## 2023-11-19 ENCOUNTER — Ambulatory Visit: Admitting: Physical Therapy

## 2023-11-19 DIAGNOSIS — M6281 Muscle weakness (generalized): Secondary | ICD-10-CM | POA: Diagnosis not present

## 2023-11-19 DIAGNOSIS — M25562 Pain in left knee: Secondary | ICD-10-CM

## 2023-11-19 DIAGNOSIS — R600 Localized edema: Secondary | ICD-10-CM

## 2023-11-19 NOTE — Therapy (Signed)
 OUTPATIENT PHYSICAL THERAPY LOWER EXTREMITY   Referring diagnosis? Z61.096 (ICD-10-CM) - Status post total left knee replacement Treatment diagnosis? (if different than referring diagnosis) m25.562 What was this (referring dx) caused by? [x]  Surgery []  Fall []  Ongoing issue []  Arthritis []  Other: ____________  Laterality: []  Rt [x]  Lt []  Both  Check all possible CPT codes:  *CHOOSE 10 OR LESS*    See Planned Interventions listed in the Plan section of the Evaluation.    Patient Name: Kenneth Harper MRN: [id number] DOB:1941-06-29, 83 y.o., male Today's Date: 11/19/2023  END OF SESSION:  PT End of Session - 11/19/23 1350     Visit Number 4    Number of Visits 10    Date for PT Re-Evaluation 12/20/23    Authorization Type [employer]    Authorization Time Period $25 COPAY  Authorization #[id number]  Tracking #[id number]  11/08/2023-12/20/2023    PT Start Time 1350    PT Stop Time 1430    PT Time Calculation (min) 40 min    Activity Tolerance Patient tolerated treatment well    Behavior During Therapy [facility] for tasks assessed/performed               Past Medical History:  Diagnosis Date   Arthritis    knees and back   Asthma    childhood   Carpal tunnel syndrome    Cataract    Diverticulitis    GERD (gastroesophageal reflux disease)    Hypercholesterolemia    Memory loss    Stroke (HCC) 07/31/2020   Past Surgical History:  Procedure Laterality Date   BILATERAL CARPAL TUNNEL RELEASE     CATARACT EXTRACTION, BILATERAL Bilateral    COLONOSCOPY WITH PROPOFOL  N/A 10/29/2014   Procedure: COLONOSCOPY WITH PROPOFOL ;  Surgeon: Garrett Kallman, MD;  Location: [facility];  Service: Endoscopy;  Laterality: N/A;   EYE SURGERY     LUMBAR LAMINECTOMY/DECOMPRESSION MICRODISCECTOMY N/A 10/01/2015   Procedure: LEFT L2-3 MICRODISCECTOMY;  Surgeon: Alphonso Jean, MD;  Location: [facility];  Service: Orthopedics;  Laterality: N/A;   TOTAL KNEE ARTHROPLASTY Left 10/21/2023   Procedure:  ARTHROPLASTY, KNEE, TOTAL;  Surgeon: Arnie Lao, MD;  Location: [facility];  Service: Orthopedics;  Laterality: Left;   Patient Active Problem List   Diagnosis Date Noted   Status post total left knee replacement 10/21/2023   Unilateral primary osteoarthritis, left knee 10/20/2023   Essential hypertension 08/13/2020   Transaminitis    Steroid-induced hyperglycemia    Prediabetes    Contusion of right knee    Thalamic hemorrhage with stroke (HCC) 08/06/2020   Hemorrhagic stroke (HCC)    Memory loss    Dyslipidemia    Elevated blood pressure reading    AKI (acute kidney injury) (HCC)    Primary osteoarthritis of right knee    ICH (intracerebral hemorrhage) (HCC) 07/31/2020   Bilateral primary osteoarthritis of knee 08/10/2019   Carpal tunnel syndrome on both sides 07/28/2018   Lumbar herniated disc 10/01/2015   Herniation of lumbar intervertebral disc with radiculopathy 09/30/2015    Class: Acute    PCP: Jearldine Mina, MD   REFERRING PROVIDER: Arnie Lao*   REFERRING DIAG:  [phone] (ICD-10-CM) - Status post total left knee replacement  M17.12 (ICD-10-CM) - Unilateral primary osteoarthritis, left knee    THERAPY DIAG:  No diagnosis found.  Rationale for Evaluation and Treatment: Rehabilitation  ONSET DATE: S/P Left TKA on 10/21/23   SUBJECTIVE:   SUBJECTIVE STATEMENT: Pt states the knee itself isn't  sore but just the thigh muscle.   PERTINENT HISTORY: S/P Left TKA on 10/21/23, OA, HTN, Thalamic hemorrhage stroke, memory loss, lumbar herniated disc with laminectomy,   PAIN:  NPRS scale: at rest 1/10 with movement 6/10 Pain location:Lt knee Pain description: constant Aggravating factors:  Relieving factors: rest, meds  PRECAUTIONS: None  RED FLAGS: None   WEIGHT BEARING RESTRICTIONS: No  FALLS:  Has patient fallen in last 6 months? No  LIVING ENVIRONMENT:3 steps to enter from garage, has to do these one at a time for  now  OCCUPATION: retired  PLOF: Independent  PATIENT GOALS: get back to fishing  NEXT MD VISIT: 12/01/23  OBJECTIVE:  Note: Objective measures were completed at Evaluation unless otherwise noted.  PATIENT SURVEYS:  Patient-Specific Activity Scoring Scheme  "0" represents "unable to perform." "10" represents "able to perform at prior level. 0 1 2 3 4 5 6 7 8 9  10 (Date and Score)   Activity Eval     1. stairs  7                     Score 7    Total score = sum of the activity scores/number of activities Minimum detectable change (90%CI) for average score = 2 points Minimum detectable change (90%CI) for single activity score = 3 points  EDEMA:  Mild edema in left knee at eval   LOWER EXTREMITY ROM:   ROM Left eval 11/09/23 11/19/23  Hip flexion     Hip extension     Hip abduction     Hip adduction     Hip internal rotation     Hip external rotation     Knee flexion A:95 P:100 A: 100 P: 102 A: 105 P: 108  Knee extension A:10 P:5 A: 6 P; 5 A: 5 P: 3  Ankle dorsiflexion     Ankle plantarflexion     Ankle inversion     Ankle eversion      (Blank rows = not tested)  LOWER EXTREMITY MMT:  MMT Right eval Left eval  Hip flexion  5  Hip extension    Hip abduction    Hip adduction    Hip internal rotation    Hip external rotation    Knee flexion  5  Knee extension 4/5 42#, 49# 4/5 32#, 32#  Ankle dorsiflexion    Ankle plantarflexion    Ankle inversion    Ankle eversion     (Blank rows = not tested  FUNCTIONAL TESTS:  Eval:  30 second sit to stand test 7 reps in 30 sec, no UE support from standard chair  GAIT: Eval Comments: ambulates into clinic with SPC, relays he can walk in home without AD and did not use one PLOF   TODAY'S TREATMENT:  11/19/2023 Therapeutic Exercise: Scifit Seat 12 full revolutions x 5 min Prone quad stretch with strap 2x30" Prone knee hang x2 min with self overpressure Quad set with PT overpressure 2x10 SLR  2x10 Seated knee flexion self stretch x10 Rechecked ROM  Therapeutic Activity: Fwd step up 6" step 2x10 Side step up and over 6" step 2x10 Single leg heel raise 2x10 Tandem stance x30"  Manual Therapy: STM & TPR L quad IASTM L quad   11/17/2023 Therapeutic Exercise:  Recumbent bike seat 6 rocking flexion stretch 2.5 min, full revolutions backwards 1.5 minutes, then forward full revolutions level 1 4 min Gastroc stretch step forefoot weight bearing 30 sec 3 reps Hamstring stretch 1  rep seated edge of chair and 1 rep LLE long sitting - strap DF 30 sec hold LAQ LLE 10 reps  Therapeutic Activities: PT demo & verbal cues weight shift upon arising to load knee.  Pt return demo 5 reps. Gait initiated with less decreased stance duration.  Leg press BLEs 75# 15 reps cues on max ext & flex;  single LLE 43# 10 reps 2 sets Tandem stance 30 sec LLE in front & in back; 1st set floor eyes open with only 1-2 touches; 2nd set floor  Manual Therapy: PROM with overpressure seated knee flexion   Vaso: Vasopneumatic: medium compression, 34 deg, x 10 minutes with elevation.     TREATMENT:  11/09/23 TherEx:  Nustep: level 5 x 8 minutes UE/LE seat at 9, arms 10 Seated LAQ: 2 x 10 3# left LE Seated SLR: 2 x 10 left LE TherActivites:  Sit to stand: 2 x10 Leg Press: 62# bil LE 2 x 10 ,  left LE only: 31#  x 10  Manual:  Sitting knee flexion to pt's tolerance Supine: knee extension with overpressure during quad sets x 10 holding 5-10 sec to pt's tolerance Modalities:  Vasopneumatic: medium compression, 34 deg, x 10 minutes    Eval Thererex HEP creation and review with demonstration and trial set preformed, see below for details  Manual therapy Left knee PROM with overpressure into flexion to tolerance     PATIENT EDUCATION: Education details: HEP, PT plan of care Person educated: Patient Education method: Explanation, Demonstration, Verbal cues, and Handouts Education comprehension:  verbalized understanding and needs further education   HOME EXERCISE PROGRAM: Access Code: [email or web address] URL: [email or web address] Date: 11/17/2023 Prepared by: Lorie Rook  Exercises - Supine Quad Set  - 3 x daily - 6 x weekly - 1 sets - 10 reps - 5 sec hold - Supine Hamstring Stretch with Strap  - 3 x daily - 6 x weekly - 1 sets - 3 reps - 30 sec hold - Supine Heel Slide with Strap  - 3 x daily - 6 x weekly - 10 reps - 5 sec hold - Seated Knee Flexion Extension AAROM with Overpressure  - 3 x daily - 6 x weekly - 1 sets - 10 reps - 5 sec hold - Seated Straight Leg Raise with Quad Contraction  - 3 x daily - 6 x weekly - 1 sets - 10 reps - Sit to Stand Without Arm Support  - 3 x daily - 6 x weekly - 1-2 sets - 10 reps - standing calf stretch with forefoot on small step or brick  - 1-2 x daily - 7 x weekly - 1 sets - 3 reps - 30 seconds hold - Tandem Stance  - 1 x daily - 7 x weekly - 3 sets - 2 reps - 30 seconds hold - Seated Table Hamstring Stretch  - 1-2 x daily - 7 x weekly - 1 sets - 3 reps - 30 seconds hold   ASSESSMENT:  CLINICAL IMPRESSION:  Quad stretch provided to address pt's anterior thigh soreness/tightness. Trigger point notable -- discussed and educated pt on self massage. Continued ROM and strengthening in standing for curb/stair negotiation. Pt continues to make good progress with his ROM. Getting almost full extension and close to meeting his knee flexion goals as well.   OBJECTIVE IMPAIRMENTS: decreased activity tolerance, difficulty walking, decreased balance, decreased endurance, decreased mobility, decreased ROM, decreased strength, impaired flexibility, impaired LE use, and pain.  ACTIVITY LIMITATIONS: bending, lifting, carry,  locomotion, cleaning, community activity, stairs  PERSONAL FACTORS: post op status, are also affecting patient's functional outcome.  REHAB POTENTIAL: Good  CLINICAL DECISION MAKING: Stable/uncomplicated  EVALUATION  COMPLEXITY: Low    GOALS: Short term PT Goals Target date: 12/06/2023   Pt will be I and compliant with HEP. Baseline:  Goal status: Ongoing  11/17/2023   Long term PT goals Target date:  12/20/2023   Pt will improve left knee PROM to 0-120 deg and AROM 0-110 deg to improve functional mobility Baseline: Goal status: Ongoing  11/17/2023 Pt will improve  hip/knee strength to at least 5/5 MMT or >50# on HHD for knee extensor strength to improve functional strength Baseline: Goal status: Ongoing  11/17/2023 Pt will improve PSFS to at least 9/10 functional to show improved function Baseline: Goal status: Ongoing  11/17/2023 Pt will improve 30 second sit to stand test to 10 reps in 30 sec, no UE support from standard chair, to show improved functional strength Baseline: Goal status: Ongoing  11/17/2023 Pt will reduce pain to overall less than 2-3/10 with usual activity Baseline: Goal status: Ongoing  11/17/2023  PLAN: PT FREQUENCY: 1-3 times per week   PT DURATION: 4-6 weeks  PLANNED INTERVENTIONS (unless contraindicated):  97110-Therapeutic exercises, 97530- Therapeutic activity, 97112- Neuromuscular re-education, 97535- Self Care, and [id number]- Manual therapy vasopnumatic device  PLAN FOR NEXT SESSION: Progress/modify HEP as needed, knee ROM and quad strength, gait, functional mobility    Deloris Moger April Ma L Dominik Yordy, PT, DPT 11/19/2023, 1:50 PM

## 2023-11-24 ENCOUNTER — Encounter: Payer: Self-pay | Admitting: Rehabilitative and Restorative Service Providers"

## 2023-11-24 ENCOUNTER — Ambulatory Visit: Admitting: Rehabilitative and Restorative Service Providers"

## 2023-11-24 DIAGNOSIS — M25562 Pain in left knee: Secondary | ICD-10-CM

## 2023-11-24 DIAGNOSIS — M6281 Muscle weakness (generalized): Secondary | ICD-10-CM

## 2023-11-24 DIAGNOSIS — R600 Localized edema: Secondary | ICD-10-CM

## 2023-11-24 NOTE — Therapy (Signed)
 OUTPATIENT PHYSICAL THERAPY LOWER EXTREMITY TREATMENT NOTE   Referring diagnosis? Z61.096 (ICD-10-CM) - Status post total left knee replacement Treatment diagnosis? (if different than referring diagnosis) m25.562 What was this (referring dx) caused by? [x]  Surgery []  Fall []  Ongoing issue []  Arthritis []  Other: ____________  Laterality: []  Rt [x]  Lt []  Both  Check all possible CPT codes:  *CHOOSE 10 OR LESS*    See Planned Interventions listed in the Plan section of the Evaluation.    Patient Name: Kenneth Harper MRN: [id number] DOB:27-Mar-1941, 83 y.o., male Today's Date: 11/24/2023  END OF SESSION:  PT End of Session - 11/24/23 1302     Visit Number 5    Number of Visits 10    Date for PT Re-Evaluation 12/20/23    Authorization Type [employer]    Authorization Time Period $25 COPAY  Authorization #[id number]  Tracking #[id number]  11/08/2023-12/20/2023    PT Start Time 1302    PT Stop Time 1357    PT Time Calculation (min) 55 min    Activity Tolerance Patient tolerated treatment well;No increased pain    Behavior During Therapy WFL for tasks assessed/performed             Past Medical History:  Diagnosis Date   Arthritis    knees and back   Asthma    childhood   Carpal tunnel syndrome    Cataract    Diverticulitis    GERD (gastroesophageal reflux disease)    Hypercholesterolemia    Memory loss    Stroke (HCC) 07/31/2020   Past Surgical History:  Procedure Laterality Date   BILATERAL CARPAL TUNNEL RELEASE     CATARACT EXTRACTION, BILATERAL Bilateral    COLONOSCOPY WITH PROPOFOL  N/A 10/29/2014   Procedure: COLONOSCOPY WITH PROPOFOL ;  Surgeon: Garrett Kallman, MD;  Location: [facility];  Service: Endoscopy;  Laterality: N/A;   EYE SURGERY     LUMBAR LAMINECTOMY/DECOMPRESSION MICRODISCECTOMY N/A 10/01/2015   Procedure: LEFT L2-3 MICRODISCECTOMY;  Surgeon: Alphonso Jean, MD;  Location: [facility];  Service: Orthopedics;  Laterality: N/A;   TOTAL KNEE ARTHROPLASTY  Left 10/21/2023   Procedure: ARTHROPLASTY, KNEE, TOTAL;  Surgeon: Arnie Lao, MD;  Location: [facility];  Service: Orthopedics;  Laterality: Left;   Patient Active Problem List   Diagnosis Date Noted   Status post total left knee replacement 10/21/2023   Unilateral primary osteoarthritis, left knee 10/20/2023   Essential hypertension 08/13/2020   Transaminitis    Steroid-induced hyperglycemia    Prediabetes    Contusion of right knee    Thalamic hemorrhage with stroke (HCC) 08/06/2020   Hemorrhagic stroke (HCC)    Memory loss    Dyslipidemia    Elevated blood pressure reading    AKI (acute kidney injury) (HCC)    Primary osteoarthritis of right knee    ICH (intracerebral hemorrhage) (HCC) 07/31/2020   Bilateral primary osteoarthritis of knee 08/10/2019   Carpal tunnel syndrome on both sides 07/28/2018   Lumbar herniated disc 10/01/2015   Herniation of lumbar intervertebral disc with radiculopathy 09/30/2015    Class: Acute    PCP: Jearldine Mina, MD   REFERRING PROVIDER: Arnie Lao*   REFERRING DIAG:  [phone] (ICD-10-CM) - Status post total left knee replacement  M17.12 (ICD-10-CM) - Unilateral primary osteoarthritis, left knee    THERAPY DIAG:  Acute pain of left knee  Muscle weakness (generalized)  Localized edema  Rationale for Evaluation and Treatment: Rehabilitation  ONSET DATE: S/P Left TKA on 10/21/23  SUBJECTIVE:   SUBJECTIVE STATEMENT: Temarion is getting about 3 hours of sleep uninterrupted.  He reports good HEP compliance.  PERTINENT HISTORY: S/P Left TKA on 10/21/23, OA, HTN, Thalamic hemorrhage stroke, memory loss, lumbar herniated disc with laminectomy,   PAIN:  NPRS scale: 0-3/10 this week Pain location: Lt knee Pain description: Constant Aggravating factors: Overuse Relieving factors: rest, meds  PRECAUTIONS: None  RED FLAGS: None   WEIGHT BEARING RESTRICTIONS: No  FALLS:  Has patient fallen in last 6 months?  No  LIVING ENVIRONMENT:3 steps to enter from garage, has to do these one at a time for now  OCCUPATION: retired  PLOF: Independent  PATIENT GOALS: get back to fishing  NEXT MD VISIT: 12/01/23  OBJECTIVE:  Note: Objective measures were completed at Evaluation unless otherwise noted.  PATIENT SURVEYS:  Patient-Specific Activity Scoring Scheme  "0" represents "unable to perform." "10" represents "able to perform at prior level. 0 1 2 3 4 5 6 7 8 9  10 (Date and Score)   Activity Eval     1. stairs  7                     Score 7    Total score = sum of the activity scores/number of activities Minimum detectable change (90%CI) for average score = 2 points Minimum detectable change (90%CI) for single activity score = 3 points  EDEMA:  Mild edema in left knee at eval   LOWER EXTREMITY ROM:   ROM Left eval 11/09/23 11/19/23 11/24/2023  Hip flexion      Hip extension      Hip abduction      Hip adduction      Hip internal rotation      Hip external rotation      Knee flexion A:95 P:100 A: 100 P: 102 A: 105 P: 108 Active 106  Knee extension A:10 P:5 A: 6 P; 5 A: 5 P: 3 Active lacks 5  Ankle dorsiflexion      Ankle plantarflexion      Ankle inversion      Ankle eversion       (Blank rows = not tested)  LOWER EXTREMITY MMT:  MMT Right eval Left eval  Hip flexion  5  Hip extension    Hip abduction    Hip adduction    Hip internal rotation    Hip external rotation    Knee flexion  5  Knee extension 4/5 42#, 49# 4/5 32#, 32#  Ankle dorsiflexion    Ankle plantarflexion    Ankle inversion    Ankle eversion     (Blank rows = not tested  FUNCTIONAL TESTS:  Eval:  30 second sit to stand test 7 reps in 30 sec, no UE support from standard chair  GAIT: Eval Comments: ambulates into clinic with SPC, relays he can walk in home without AD and did not use one PLOF   TODAY'S TREATMENT:  11/24/2023 Recumbent bike Seat 8 Level 3-5 for 5 minutes Quad sets with  hell prop 2 sets of 10 for 5 seconds Supine knee flexion with strap 10 x 10 seconds  Functional Activities: Double Leg Press 87# 15 x full extension and slow eccentrics Single Leg Press 50# 10 x full extension and slow eccentrics Step-down off 4 and 6 inch step 10 x each slow eccentrics  Neuromuscular re-education: Tandem balance eyes open; head turning 4 x 20 seconds each Dynamic tandem 2 laps forward and back Dynamic slow  marching 4 laps  Vaso left knee 10 minutes Medium Pressure 34*   11/19/2023 Therapeutic Exercise: Scifit Seat 12 full revolutions x 5 min Prone quad stretch with strap 2x30" Prone knee hang x2 min with self overpressure Quad set with PT overpressure 2x10 SLR 2x10 Seated knee flexion self stretch x10 Rechecked ROM  Therapeutic Activity: Fwd step up 6" step 2x10 Side step up and over 6" step 2x10 Single leg heel raise 2x10 Tandem stance x30"  Manual Therapy: STM & TPR L quad IASTM L quad   11/17/2023 Therapeutic Exercise:  Recumbent bike seat 6 rocking flexion stretch 2.5 min, full revolutions backwards 1.5 minutes, then forward full revolutions level 1 4 min Gastroc stretch step forefoot weight bearing 30 sec 3 reps Hamstring stretch 1 rep seated edge of chair and 1 rep LLE long sitting - strap DF 30 sec hold LAQ LLE 10 reps  Therapeutic Activities: PT demo & verbal cues weight shift upon arising to load knee.  Pt return demo 5 reps. Gait initiated with less decreased stance duration.  Leg press BLEs 75# 15 reps cues on max ext & flex;  single LLE 43# 10 reps 2 sets Tandem stance 30 sec LLE in front & in back; 1st set floor eyes open with only 1-2 touches; 2nd set floor  Manual Therapy: PROM with overpressure seated knee flexion   Vaso: Vasopneumatic: medium compression, 34 deg, x 10 minutes with elevation.     PATIENT EDUCATION: Education details: HEP, PT plan of care Person educated: Patient Education method: Explanation, Demonstration,  Verbal cues, and Handouts Education comprehension: verbalized understanding and needs further education   HOME EXERCISE PROGRAM: Access Code: [email or web address] URL: [email or web address] Date: 11/17/2023 Prepared by: Lorie Rook  Exercises - Supine Quad Set  - 3 x daily - 6 x weekly - 1 sets - 10 reps - 5 sec hold - Supine Hamstring Stretch with Strap  - 3 x daily - 6 x weekly - 1 sets - 3 reps - 30 sec hold - Supine Heel Slide with Strap  - 3 x daily - 6 x weekly - 10 reps - 5 sec hold - Seated Knee Flexion Extension AAROM with Overpressure  - 3 x daily - 6 x weekly - 1 sets - 10 reps - 5 sec hold - Seated Straight Leg Raise with Quad Contraction  - 3 x daily - 6 x weekly - 1 sets - 10 reps - Sit to Stand Without Arm Support  - 3 x daily - 6 x weekly - 1-2 sets - 10 reps - standing calf stretch with forefoot on small step or brick  - 1-2 x daily - 7 x weekly - 1 sets - 3 reps - 30 seconds hold - Tandem Stance  - 1 x daily - 7 x weekly - 3 sets - 2 reps - 30 seconds hold - Seated Table Hamstring Stretch  - 1-2 x daily - 7 x weekly - 1 sets - 3 reps - 30 seconds hold   ASSESSMENT:  CLINICAL IMPRESSION:  AROM was assessed at 0 - 5 - 106 degrees today.  I encouraged Smit to try to get as close to 100 quadriceps sets per day at home to return full extension active range of motion, improve quadriceps strength and decrease edema.  This, along with his current home exercise program should allow Domer to meet long-term goals.  OBJECTIVE IMPAIRMENTS: decreased activity tolerance, difficulty walking, decreased balance, decreased endurance, decreased mobility, decreased ROM, decreased strength, impaired  flexibility, impaired LE use, and pain.  ACTIVITY LIMITATIONS: bending, lifting, carry, locomotion, cleaning, community activity, stairs  PERSONAL FACTORS: post op status, are also affecting patient's functional outcome.  REHAB POTENTIAL: Good  CLINICAL DECISION MAKING:  Stable/uncomplicated  EVALUATION COMPLEXITY: Low    GOALS: Short term PT Goals Target date: 12/06/2023   Pt will be I and compliant with HEP. Baseline:  Goal status: Met 11/24/2023   Long term PT goals Target date:  12/20/2023   Pt will improve left knee PROM to 0-120 deg and AROM 0-110 deg to improve functional mobility Baseline: Goal status: Ongoing  11/24/2023 Pt will improve  hip/knee strength to at least 5/5 MMT or >50# on HHD for knee extensor strength to improve functional strength Baseline: Goal status: Ongoing  11/17/2023 Pt will improve PSFS to at least 9/10 functional to show improved function Baseline: Goal status: Ongoing  11/17/2023 Pt will improve 30 second sit to stand test to 10 reps in 30 sec, no UE support from standard chair, to show improved functional strength Baseline: Goal status: Ongoing  11/17/2023 Pt will reduce pain to overall less than 2-3/10 with usual activity Baseline: Goal status: Ongoing  11/24/2023  PLAN: PT FREQUENCY: 1-3 times per week   PT DURATION: 4-6 weeks  PLANNED INTERVENTIONS (unless contraindicated):  97110-Therapeutic exercises, 97530- Therapeutic activity, 97112- Neuromuscular re-education, 97535- Self Care, and 16109- Manual therapy vasopnumatic device  PLAN FOR NEXT SESSION: Knee extension ROM and quadriceps strength, gait quality, balance and functional progressions to prepare for independent rehabilitation.    Joli Neas, PT, MPT 11/24/2023, 5:07 PM

## 2023-11-26 ENCOUNTER — Encounter: Admitting: Physical Therapy

## 2023-11-26 NOTE — Therapy (Deleted)
 OUTPATIENT PHYSICAL THERAPY LOWER EXTREMITY TREATMENT NOTE   Referring diagnosis? U98.119 (ICD-10-CM) - Status post total left knee replacement Treatment diagnosis? (if different than referring diagnosis) m25.562 What was this (referring dx) caused by? [x]  Surgery []  Fall []  Ongoing issue []  Arthritis []  Other: ____________  Laterality: []  Rt [x]  Lt []  Both  Check all possible CPT codes:  *CHOOSE 10 OR LESS*    See Planned Interventions listed in the Plan section of the Evaluation.    Patient Name: Kenneth Harper MRN: [id number] DOB:[birth year], 83 y.o., male Today's Date: 11/26/2023  END OF SESSION:    Past Medical History:  Diagnosis Date   Arthritis    knees and back   Asthma    childhood   Carpal tunnel syndrome    Cataract    Diverticulitis    GERD (gastroesophageal reflux disease)    Hypercholesterolemia    Memory loss    Stroke (HCC) 07/31/2020   Past Surgical History:  Procedure Laterality Date   BILATERAL CARPAL TUNNEL RELEASE     CATARACT EXTRACTION, BILATERAL Bilateral    COLONOSCOPY WITH PROPOFOL  N/A 10/29/2014   Procedure: COLONOSCOPY WITH PROPOFOL ;  Surgeon: Garrett Kallman, MD;  Location: [facility];  Service: Endoscopy;  Laterality: N/A;   EYE SURGERY     LUMBAR LAMINECTOMY/DECOMPRESSION MICRODISCECTOMY N/A 10/01/2015   Procedure: LEFT L2-3 MICRODISCECTOMY;  Surgeon: Alphonso Jean, MD;  Location: [facility];  Service: Orthopedics;  Laterality: N/A;   TOTAL KNEE ARTHROPLASTY Left 10/21/2023   Procedure: ARTHROPLASTY, KNEE, TOTAL;  Surgeon: Arnie Lao, MD;  Location: [facility];  Service: Orthopedics;  Laterality: Left;   Patient Active Problem List   Diagnosis Date Noted   Status post total left knee replacement 10/21/2023   Unilateral primary osteoarthritis, left knee 10/20/2023   Essential hypertension 08/13/2020   Transaminitis    Steroid-induced hyperglycemia    Prediabetes    Contusion of right knee    Thalamic hemorrhage with  stroke (HCC) 08/06/2020   Hemorrhagic stroke (HCC)    Memory loss    Dyslipidemia    Elevated blood pressure reading    AKI (acute kidney injury) (HCC)    Primary osteoarthritis of right knee    ICH (intracerebral hemorrhage) (HCC) 07/31/2020   Bilateral primary osteoarthritis of knee 08/10/2019   Carpal tunnel syndrome on both sides 07/28/2018   Lumbar herniated disc 10/01/2015   Herniation of lumbar intervertebral disc with radiculopathy 09/30/2015    Class: Acute    PCP: Jearldine Mina, MD   REFERRING PROVIDER: Arnie Lao*   REFERRING DIAG:  [phone] (ICD-10-CM) - Status post total left knee replacement  M17.12 (ICD-10-CM) - Unilateral primary osteoarthritis, left knee    THERAPY DIAG:  No diagnosis found.  Rationale for Evaluation and Treatment: Rehabilitation  ONSET DATE: S/P Left TKA on 10/21/23   SUBJECTIVE:   SUBJECTIVE STATEMENT: *** Kenneth Harper is getting about 3 hours of sleep uninterrupted.  He reports good HEP compliance.  PERTINENT HISTORY: S/P Left TKA on 10/21/23, OA, HTN, Thalamic hemorrhage stroke, memory loss, lumbar herniated disc with laminectomy,   PAIN:  NPRS scale: 0-3/10 this week Pain location: Lt knee Pain description: Constant Aggravating factors: Overuse Relieving factors: rest, meds  PRECAUTIONS: None  RED FLAGS: None   WEIGHT BEARING RESTRICTIONS: No  FALLS:  Has patient fallen in last 6 months? No  LIVING ENVIRONMENT:3 steps to enter from garage, has to do these one at a time for now  OCCUPATION: retired  PLOF: Independent  PATIENT  GOALS: get back to fishing  NEXT MD VISIT: 12/01/23  OBJECTIVE:  Note: Objective measures were completed at Evaluation unless otherwise noted.  PATIENT SURVEYS:  Patient-Specific Activity Scoring Scheme  "0" represents "unable to perform." "10" represents "able to perform at prior level. 0 1 2 3 4 5 6 7 8 9  10 (Date and Score)   Activity Eval     1. stairs  7                      Score 7    Total score = sum of the activity scores/number of activities Minimum detectable change (90%CI) for average score = 2 points Minimum detectable change (90%CI) for single activity score = 3 points  EDEMA:  Mild edema in left knee at eval   LOWER EXTREMITY ROM:   ROM Left eval 11/09/23 11/19/23 11/24/2023  Hip flexion      Hip extension      Hip abduction      Hip adduction      Hip internal rotation      Hip external rotation      Knee flexion A:95 P:100 A: 100 P: 102 A: 105 P: 108 Active 106  Knee extension A:10 P:5 A: 6 P; 5 A: 5 P: 3 Active lacks 5  Ankle dorsiflexion      Ankle plantarflexion      Ankle inversion      Ankle eversion       (Blank rows = not tested)  LOWER EXTREMITY MMT:  MMT Right eval Left eval  Hip flexion  5  Hip extension    Hip abduction    Hip adduction    Hip internal rotation    Hip external rotation    Knee flexion  5  Knee extension 4/5 42#, 49# 4/5 32#, 32#  Ankle dorsiflexion    Ankle plantarflexion    Ankle inversion    Ankle eversion     (Blank rows = not tested  FUNCTIONAL TESTS:  Eval:  30 second sit to stand test 7 reps in 30 sec, no UE support from standard chair  GAIT: Eval Comments: ambulates into clinic with SPC, relays he can walk in home without AD and did not use one PLOF   TODAY'S TREATMENT:  11/26/2023 ***  11/24/2023 Recumbent bike Seat 8 Level 3-5 for 5 minutes Quad sets with hell prop 2 sets of 10 for 5 seconds Supine knee flexion with strap 10 x 10 seconds  Functional Activities: Double Leg Press 87# 15 x full extension and slow eccentrics Single Leg Press 50# 10 x full extension and slow eccentrics Step-down off 4 and 6 inch step 10 x each slow eccentrics  Neuromuscular re-education: Tandem balance eyes open; head turning 4 x 20 seconds each Dynamic tandem 2 laps forward and back Dynamic slow marching 4 laps  Vaso left knee 10 minutes Medium Pressure  34*   11/19/2023 Therapeutic Exercise: Scifit Seat 12 full revolutions x 5 min Prone quad stretch with strap 2x30" Prone knee hang x2 min with self overpressure Quad set with PT overpressure 2x10 SLR 2x10 Seated knee flexion self stretch x10 Rechecked ROM  Therapeutic Activity: Fwd step up 6" step 2x10 Side step up and over 6" step 2x10 Single leg heel raise 2x10 Tandem stance x30"  Manual Therapy: STM & TPR L quad IASTM L quad   11/17/2023 Therapeutic Exercise:  Recumbent bike seat 6 rocking flexion stretch 2.5 min, full revolutions backwards 1.5  minutes, then forward full revolutions level 1 4 min Gastroc stretch step forefoot weight bearing 30 sec 3 reps Hamstring stretch 1 rep seated edge of chair and 1 rep LLE long sitting - strap DF 30 sec hold LAQ LLE 10 reps  Therapeutic Activities: PT demo & verbal cues weight shift upon arising to load knee.  Pt return demo 5 reps. Gait initiated with less decreased stance duration.  Leg press BLEs 75# 15 reps cues on max ext & flex;  single LLE 43# 10 reps 2 sets Tandem stance 30 sec LLE in front & in back; 1st set floor eyes open with only 1-2 touches; 2nd set floor  Manual Therapy: PROM with overpressure seated knee flexion   Vaso: Vasopneumatic: medium compression, 34 deg, x 10 minutes with elevation.     PATIENT EDUCATION: Education details: HEP, PT plan of care Person educated: Patient Education method: Explanation, Demonstration, Verbal cues, and Handouts Education comprehension: verbalized understanding and needs further education   HOME EXERCISE PROGRAM: Access Code: [email or web address] URL: [email or web address] Date: 11/17/2023 Prepared by: Lorie Rook  Exercises - Supine Quad Set  - 3 x daily - 6 x weekly - 1 sets - 10 reps - 5 sec hold - Supine Hamstring Stretch with Strap  - 3 x daily - 6 x weekly - 1 sets - 3 reps - 30 sec hold - Supine Heel Slide with Strap  - 3 x daily - 6 x weekly - 10 reps - 5  sec hold - Seated Knee Flexion Extension AAROM with Overpressure  - 3 x daily - 6 x weekly - 1 sets - 10 reps - 5 sec hold - Seated Straight Leg Raise with Quad Contraction  - 3 x daily - 6 x weekly - 1 sets - 10 reps - Sit to Stand Without Arm Support  - 3 x daily - 6 x weekly - 1-2 sets - 10 reps - standing calf stretch with forefoot on small step or brick  - 1-2 x daily - 7 x weekly - 1 sets - 3 reps - 30 seconds hold - Tandem Stance  - 1 x daily - 7 x weekly - 3 sets - 2 reps - 30 seconds hold - Seated Table Hamstring Stretch  - 1-2 x daily - 7 x weekly - 1 sets - 3 reps - 30 seconds hold   ASSESSMENT:  CLINICAL IMPRESSION:  *** AROM was assessed at 0 - 5 - 106 degrees today.  I encouraged Kenneth Harper to try to get as close to 100 quadriceps sets per day at home to return full extension active range of motion, improve quadriceps strength and decrease edema.  This, along with his current home exercise program should allow Kenneth Harper to meet long-term goals.  OBJECTIVE IMPAIRMENTS: decreased activity tolerance, difficulty walking, decreased balance, decreased endurance, decreased mobility, decreased ROM, decreased strength, impaired flexibility, impaired LE use, and pain.  ACTIVITY LIMITATIONS: bending, lifting, carry, locomotion, cleaning, community activity, stairs  PERSONAL FACTORS: post op status, are also affecting patient's functional outcome.  REHAB POTENTIAL: Good  CLINICAL DECISION MAKING: Stable/uncomplicated  EVALUATION COMPLEXITY: Low    GOALS: Short term PT Goals Target date: 12/06/2023   Pt will be I and compliant with HEP. Baseline:  Goal status: Met 11/24/2023   Long term PT goals Target date:  12/20/2023   Pt will improve left knee PROM to 0-120 deg and AROM 0-110 deg to improve functional mobility Baseline: Goal status: Ongoing  11/24/2023 Pt will improve  hip/knee  strength to at least 5/5 MMT or >50# on HHD for knee extensor strength to improve functional  strength Baseline: Goal status: Ongoing  11/17/2023 Pt will improve PSFS to at least 9/10 functional to show improved function Baseline: Goal status: Ongoing  11/17/2023 Pt will improve 30 second sit to stand test to 10 reps in 30 sec, no UE support from standard chair, to show improved functional strength Baseline: Goal status: Ongoing  11/17/2023 Pt will reduce pain to overall less than 2-3/10 with usual activity Baseline: Goal status: Ongoing  11/24/2023  PLAN: PT FREQUENCY: 1-3 times per week   PT DURATION: 4-6 weeks  PLANNED INTERVENTIONS (unless contraindicated):  97110-Therapeutic exercises, 97530- Therapeutic activity, 97112- Neuromuscular re-education, 97535- Self Care, and [id number]- Manual therapy vasopnumatic device  PLAN FOR NEXT SESSION: Knee extension ROM and quadriceps strength, gait quality, balance and functional progressions to prepare for independent rehabilitation.    Bell Carbo April Ma L Donaven Criswell, PT, DPT 11/26/2023, 8:06 AM

## 2023-11-30 ENCOUNTER — Ambulatory Visit (INDEPENDENT_AMBULATORY_CARE_PROVIDER_SITE_OTHER): Admitting: Rehabilitative and Restorative Service Providers"

## 2023-11-30 ENCOUNTER — Encounter: Payer: Self-pay | Admitting: Rehabilitative and Restorative Service Providers"

## 2023-11-30 DIAGNOSIS — M25562 Pain in left knee: Secondary | ICD-10-CM | POA: Diagnosis not present

## 2023-11-30 DIAGNOSIS — M6281 Muscle weakness (generalized): Secondary | ICD-10-CM | POA: Diagnosis not present

## 2023-11-30 DIAGNOSIS — R600 Localized edema: Secondary | ICD-10-CM | POA: Diagnosis not present

## 2023-11-30 NOTE — Therapy (Addendum)
 OUTPATIENT PHYSICAL THERAPY  TREATMENT NOTE / PROGRESS NOTE  / DISCHARGE   Patient Name: Kenneth Harper MRN: [id number] DOB:[birth year], 83 y.o., male Today's Date: 11/30/2023  Progress Note Reporting Period 11/08/2023 to 11/30/2023  See note below for Objective Data and Assessment of Progress/Goals.    END OF SESSION:  PT End of Session - 11/30/23 1144     Visit Number 6    Number of Visits 10    Date for PT Re-Evaluation 12/20/23    Authorization Type [employer]    Authorization Time Period $25 COPAY  Authorization #[id number]  Tracking #[id number]  11/08/2023-12/20/2023    Progress Note Due on Visit 10    PT Start Time 1141    PT Stop Time 1220    PT Time Calculation (min) 39 min    Activity Tolerance Patient tolerated treatment well    Behavior During Therapy [facility] for tasks assessed/performed              Past Medical History:  Diagnosis Date   Arthritis    knees and back   Asthma    childhood   Carpal tunnel syndrome    Cataract    Diverticulitis    GERD (gastroesophageal reflux disease)    Hypercholesterolemia    Memory loss    Stroke (HCC) 07/31/2020   Past Surgical History:  Procedure Laterality Date   BILATERAL CARPAL TUNNEL RELEASE     CATARACT EXTRACTION, BILATERAL Bilateral    COLONOSCOPY WITH PROPOFOL  N/A 10/29/2014   Procedure: COLONOSCOPY WITH PROPOFOL ;  Surgeon: Gladis MARLA Louder, MD;  Location: [facility];  Service: Endoscopy;  Laterality: N/A;   EYE SURGERY     LUMBAR LAMINECTOMY/DECOMPRESSION MICRODISCECTOMY N/A 10/01/2015   Procedure: LEFT L2-3 MICRODISCECTOMY;  Surgeon: Lynwood FORBES Better, MD;  Location: [facility] OR;  Service: Orthopedics;  Laterality: N/A;   TOTAL KNEE ARTHROPLASTY Left 10/21/2023   Procedure: ARTHROPLASTY, KNEE, TOTAL;  Surgeon: Vernetta Lonni GRADE, MD;  Location: [facility];  Service: Orthopedics;  Laterality: Left;   Patient Active Problem List   Diagnosis Date Noted   Status post total left knee replacement 10/21/2023   Unilateral  primary osteoarthritis, left knee 10/20/2023   Essential hypertension 08/13/2020   Transaminitis    Steroid-induced hyperglycemia    Prediabetes    Contusion of right knee    Thalamic hemorrhage with stroke (HCC) 08/06/2020   Hemorrhagic stroke (HCC)    Memory loss    Dyslipidemia    Elevated blood pressure reading    AKI (acute kidney injury) (HCC)    Primary osteoarthritis of right knee    ICH (intracerebral hemorrhage) (HCC) 07/31/2020   Bilateral primary osteoarthritis of knee 08/10/2019   Carpal tunnel syndrome on both sides 07/28/2018   Lumbar herniated disc 10/01/2015   Herniation of lumbar intervertebral disc with radiculopathy 09/30/2015    Class: Acute    PCP: Ransom Other, MD   REFERRING PROVIDER: Vernetta Lonni GRADE*   REFERRING DIAG:  [phone] (ICD-10-CM) - Status post total left knee replacement  M17.12 (ICD-10-CM) - Unilateral primary osteoarthritis, left knee    THERAPY DIAG:  Acute pain of left knee  Muscle weakness (generalized)  Localized edema  Rationale for Evaluation and Treatment: Rehabilitation  ONSET DATE: S/P Left TKA on 10/21/23   SUBJECTIVE:   SUBJECTIVE STATEMENT: Pt indicated feeling like the Rt knee can hurt more than the Lt.   Pt indicated overall improvement to normal close to 100% with Lt leg.   PERTINENT HISTORY: S/P Left TKA on  10/21/23, OA, HTN, Thalamic hemorrhage stroke, memory loss, lumbar herniated disc with laminectomy,   PAIN:  NPRS scale: upon arrival < 2/10 Pain location: Lt knee Pain description: Constant Aggravating factors: Overuse Relieving factors: rest, meds  PRECAUTIONS: None  RED FLAGS: None   WEIGHT BEARING RESTRICTIONS: No  FALLS:  Has patient fallen in last 6 months? No  LIVING ENVIRONMENT:3 steps to enter from garage, has to do these one at a time for now  OCCUPATION: retired  PLOF: Independent  PATIENT GOALS: get back to fishing  NEXT MD VISIT: 12/01/23  OBJECTIVE:  Note:  Objective measures were completed at Evaluation unless otherwise noted.  PATIENT SURVEYS:  Patient-Specific Activity Scoring Scheme  0 represents "unable to perform." 10 represents "able to perform at prior level. 0 1 2 3 4 5 6 7 8 9  10 (Date and Score)   Activity Eval  11/08/2023  11/30/2023  1. stairs  7 10                    Score 7 10   Total score = sum of the activity scores/number of activities Minimum detectable change (90%CI) for average score = 2 points Minimum detectable change (90%CI) for single activity score = 3 points  EDEMA:  11/08/2023 Mild edema in left knee at eval   LOWER EXTREMITY ROM:   ROM Left Eval 11/08/2023 11/09/23 11/19/23 11/24/2023 Left 11/30/2023  Hip flexion       Hip extension       Hip abduction       Hip adduction       Hip internal rotation       Hip external rotation       Knee flexion A:95 P:100 A: 100 P: 102 A: 105 P: 108 Active 106 AROM heel slide 112  Knee extension A:10 P:5 A: 6 P; 5 A: 5 P: 3 Active lacks 5 Seated AROM  -3  Ankle dorsiflexion       Ankle plantarflexion       Ankle inversion       Ankle eversion        (Blank rows = not tested)  LOWER EXTREMITY MMT:  MMT Right Eval 11/08/2023 Left Eval 11/08/2023 Left 11/30/2023  Hip flexion  5   Hip extension     Hip abduction     Hip adduction     Hip internal rotation     Hip external rotation     Knee flexion  5   Knee extension 4/5 42#, 49# 4/5 32#, 32# 5/5 43, 42 lbs  Ankle dorsiflexion     Ankle plantarflexion     Ankle inversion     Ankle eversion      (Blank rows = not tested  FUNCTIONAL TESTS:  11/08/2023 Eval:  30 second sit to stand test 7 reps in 30 sec, no UE support from standard chair  GAIT: 11/08/2023 Eval Comments: ambulates into clinic with [facility], relays he can walk in home without AD and did not use one PLOF                     TODAY'S TREATMENT:        DATE:  11/30/2023 Therex: UBE LE only lvl 4.0 8 mins seat Supine heel  slide AROM 5 sec hold with quad set 5 sec hold x 10 combo Seated LAQ Lt leg x 10 with pause in end ranges each way Time spent in review of HEP and updates.  TherActivity Sit to stand to sit 19 inch table height x 10 without UE assist c cues for home.  Flight of stairs reciprocal gait up/down for 2 steps then WB on Lt leg lowering and up due to Rt knee pains.  Lateral step WB Lt leg 6 inch with cues for home.  X 10 in clinic.      TODAY'S TREATMENT:        DATE: 11/24/2023 Recumbent bike Seat 8 Level 3-5 for 5 minutes Quad sets with hell prop 2 sets of 10 for 5 seconds Supine knee flexion with strap 10 x 10 seconds  Functional Activities: Double Leg Press 87# 15 x full extension and slow eccentrics Single Leg Press 50# 10 x full extension and slow eccentrics Step-down off 4 and 6 inch step 10 x each slow eccentrics  Neuromuscular re-education: Tandem balance eyes open; head turning 4 x 20 seconds each Dynamic tandem 2 laps forward and back Dynamic slow marching 4 laps  Vaso left knee 10 minutes Medium Pressure 34*   TODAY'S TREATMENT:        DATE:11/19/2023 Therapeutic Exercise: Scifit Seat 12 full revolutions x 5 min Prone quad stretch with strap 2x30 Prone knee hang x2 min with self overpressure Quad set with PT overpressure 2x10 SLR 2x10 Seated knee flexion self stretch x10 Rechecked ROM  Therapeutic Activity: Fwd step up 6 step 2x10 Side step up and over 6 step 2x10 Single leg heel raise 2x10 Tandem stance x30  Manual Therapy: STM & TPR L quad IASTM L quad   TODAY'S TREATMENT:        DATE:11/17/2023 Therapeutic Exercise:  Recumbent bike seat 6 rocking flexion stretch 2.5 min, full revolutions backwards 1.5 minutes, then forward full revolutions level 1 4 min Gastroc stretch step forefoot weight bearing 30 sec 3 reps Hamstring stretch 1 rep seated edge of chair and 1 rep LLE long sitting - strap DF 30 sec hold LAQ LLE 10 reps  Therapeutic  Activities: PT demo & verbal cues weight shift upon arising to load knee.  Pt return demo 5 reps. Gait initiated with less decreased stance duration.  Leg press BLEs 75# 15 reps cues on max ext & flex;  single LLE 43# 10 reps 2 sets Tandem stance 30 sec LLE in front & in back; 1st set floor eyes open with only 1-2 touches; 2nd set floor  Manual Therapy: PROM with overpressure seated knee flexion   Vaso: Vasopneumatic: medium compression, 34 deg, x 10 minutes with elevation.     PATIENT EDUCATION: 11/08/2023 Education details: HEP, PT plan of care Person educated: Patient Education method: Explanation, Demonstration, Verbal cues, and Handouts Education comprehension: verbalized understanding and needs further education   HOME EXERCISE PROGRAM: Access Code: [email or web address] URL: [email or web address] Date: 11/30/2023 Prepared by: Ozell Silvan  Exercises - Supine Quad Set  - 3 x daily - 6 x weekly - 1 sets - 10 reps - 5 sec hold - Supine Heel Slide (Mirrored)  - 2-3 x daily - 7 x weekly - 1-2 sets - 10 reps - 2 hold - Supine Hamstring Stretch with Strap  - 1-2 x daily - 6 x weekly - 1 sets - 3 reps - 30 sec hold - Seated Knee Flexion Extension AAROM with Overpressure  - 3 x daily - 6 x weekly - 1 sets - 10 reps - 5 sec hold - Seated Straight Leg Raise with Quad Contraction  - 1-2 x daily - 6 x weekly - 2-3 sets -  10-15 reps - Sit to Stand Without Arm Support  - 1-2 x daily - 6 x weekly - 1-2 sets - 10 reps - standing calf stretch with forefoot on small step or brick  - 1-2 x daily - 7 x weekly - 1 sets - 3 reps - 30 seconds hold - Tandem Stance  - 1 x daily - 7 x weekly - 3 sets - 2 reps - 30 seconds hold - Seated Table Hamstring Stretch  - 1-2 x daily - 7 x weekly - 1 sets - 3 reps - 30 seconds hold - Prone Quadriceps Stretch with Strap  - 1 x daily - 7 x weekly - 2 sets - 30 sec hold   ASSESSMENT:  CLINICAL IMPRESSION:  The patient has attended 6 visits over the course  of treatment cycle.  Patient has reported overall improvement at almost 100%. See objective data above for updated information regarding current presentation.  Pt indicated ability to perform reciprocal gait pattern on stairs and doing things around the house better.  At this time, reviewed HEP and introduced idea of HEP transitioning when appropriate.    OBJECTIVE IMPAIRMENTS: decreased activity tolerance, difficulty walking, decreased balance, decreased endurance, decreased mobility, decreased ROM, decreased strength, impaired flexibility, impaired LE use, and pain.  ACTIVITY LIMITATIONS: bending, lifting, carry, locomotion, cleaning, community activity, stairs  PERSONAL FACTORS: post op status, are also affecting patient's functional outcome.  REHAB POTENTIAL: Good  CLINICAL DECISION MAKING: Stable/uncomplicated  EVALUATION COMPLEXITY: Low    GOALS: Short term PT Goals Target date: 12/06/2023   Pt will be I and compliant with HEP. Baseline:  Goal status: Met 11/24/2023   Long term PT goals Target date:  12/20/2023   Pt will improve left knee PROM to 0-120 deg and AROM 0-110 deg to improve functional mobility Baseline: Goal status: on going 11/30/2023 Pt will improve  hip/knee strength to at least 5/5 MMT or >50# on HHD for knee extensor strength to improve functional strength Baseline: Goal status:  on going 11/30/2023 Pt will improve PSFS to at least 9/10 functional to show improved function Baseline: Goal status:  Met  11/30/2023 Pt will improve 30 second sit to stand test to 10 reps in 30 sec, no UE support from standard chair, to show improved functional strength Baseline: Goal status:  on going 11/30/2023 Pt will reduce pain to overall less than 2-3/10 with usual activity Baseline: Goal status: Met 11/30/2023  PLAN: PT FREQUENCY: 1-3 times per week   PT DURATION: 4-6 weeks  PLANNED INTERVENTIONS (unless contraindicated):  97110-Therapeutic exercises, 97530-  Therapeutic activity, 97112- Neuromuscular re-education, 97535- Self Care, and [id number]- Manual therapy vasopnumatic device  PLAN FOR NEXT SESSION: HEP transitioning when Pt in agreement.     Ozell Silvan, PT, DPT, OCS, ATC 11/30/23  12:20 PM     Referring diagnosis? S03.347 (ICD-10-CM) - Status post total left knee replacement Treatment diagnosis? (if different than referring diagnosis) m25.562 What was this (referring dx) caused by? [x]  Surgery []  Fall []  Ongoing issue []  Arthritis []  Other: ____________  Laterality: []  Rt [x]  Lt []  Both  Check all possible CPT codes:  *CHOOSE 10 OR LESS*    See Planned Interventions listed in the Plan section of the Evaluation.     PHYSICAL THERAPY DISCHARGE SUMMARY  Visits from Start of Care: 6  Current functional level related to goals / functional outcomes: See note   Remaining deficits: See note   Education / Equipment: HEP  Patient goals were mostly  met. Patient is being discharged due to being pleased with the current functional level.  Ozell Silvan, PT, DPT, OCS, ATC 02/28/24  8:30 AM

## 2023-12-01 ENCOUNTER — Encounter: Admitting: Orthopaedic Surgery

## 2023-12-02 ENCOUNTER — Encounter: Admitting: Physical Therapy

## 2023-12-02 NOTE — Therapy (Deleted)
 OUTPATIENT PHYSICAL THERAPY  TREATMENT NOTE / PROGRESS NOTE   Patient Name: Kenneth Harper MRN: [id number] DOB:[birth year], 83 y.o., male Today's Date: 12/02/2023  Progress Note Reporting Period 11/08/2023 to 11/30/2023  See note below for Objective Data and Assessment of Progress/Goals.    END OF SESSION:     Past Medical History:  Diagnosis Date   Arthritis    knees and back   Asthma    childhood   Carpal tunnel syndrome    Cataract    Diverticulitis    GERD (gastroesophageal reflux disease)    Hypercholesterolemia    Memory loss    Stroke (HCC) 07/31/2020   Past Surgical History:  Procedure Laterality Date   BILATERAL CARPAL TUNNEL RELEASE     CATARACT EXTRACTION, BILATERAL Bilateral    COLONOSCOPY WITH PROPOFOL  N/A 10/29/2014   Procedure: COLONOSCOPY WITH PROPOFOL ;  Surgeon: Garrett Kallman, MD;  Location: [facility];  Service: Endoscopy;  Laterality: N/A;   EYE SURGERY     LUMBAR LAMINECTOMY/DECOMPRESSION MICRODISCECTOMY N/A 10/01/2015   Procedure: LEFT L2-3 MICRODISCECTOMY;  Surgeon: Alphonso Jean, MD;  Location: [facility];  Service: Orthopedics;  Laterality: N/A;   TOTAL KNEE ARTHROPLASTY Left 10/21/2023   Procedure: ARTHROPLASTY, KNEE, TOTAL;  Surgeon: Arnie Lao, MD;  Location: [facility];  Service: Orthopedics;  Laterality: Left;   Patient Active Problem List   Diagnosis Date Noted   Status post total left knee replacement 10/21/2023   Unilateral primary osteoarthritis, left knee 10/20/2023   Essential hypertension 08/13/2020   Transaminitis    Steroid-induced hyperglycemia    Prediabetes    Contusion of right knee    Thalamic hemorrhage with stroke (HCC) 08/06/2020   Hemorrhagic stroke (HCC)    Memory loss    Dyslipidemia    Elevated blood pressure reading    AKI (acute kidney injury) (HCC)    Primary osteoarthritis of right knee    ICH (intracerebral hemorrhage) (HCC) 07/31/2020   Bilateral primary osteoarthritis of knee 08/10/2019   Carpal  tunnel syndrome on both sides 07/28/2018   Lumbar herniated disc 10/01/2015   Herniation of lumbar intervertebral disc with radiculopathy 09/30/2015    Class: Acute    PCP: Jearldine Mina, MD   REFERRING PROVIDER: Arnie Lao*   REFERRING DIAG:  [id number] (ICD-10-CM) - Status post total left knee replacement  M17.12 (ICD-10-CM) - Unilateral primary osteoarthritis, left knee    THERAPY DIAG:  No diagnosis found.  Rationale for Evaluation and Treatment: Rehabilitation  ONSET DATE: S/P Left TKA on 10/21/23   SUBJECTIVE:   SUBJECTIVE STATEMENT: *** Pt indicated feeling like the Rt knee can hurt more than the Lt.   Pt indicated overall improvement to normal close to 100% with Lt leg.   PERTINENT HISTORY: S/P Left TKA on 10/21/23, OA, HTN, Thalamic hemorrhage stroke, memory loss, lumbar herniated disc with laminectomy,   PAIN:  NPRS scale: upon arrival < 2/10 Pain location: Lt knee Pain description: Constant Aggravating factors: Overuse Relieving factors: rest, meds  PRECAUTIONS: None  RED FLAGS: None   WEIGHT BEARING RESTRICTIONS: No  FALLS:  Has patient fallen in last 6 months? No  LIVING ENVIRONMENT:3 steps to enter from garage, has to do these one at a time for now  OCCUPATION: retired  PLOF: Independent  PATIENT GOALS: get back to fishing  NEXT MD VISIT: 12/01/23  OBJECTIVE:  Note: Objective measures were completed at Evaluation unless otherwise noted.  PATIENT SURVEYS:  Patient-Specific Activity Scoring Scheme  "0" represents "unable to  perform." "10" represents "able to perform at prior level. 0 1 2 3 4 5 6 7 8 9  10 (Date and Score)   Activity Eval  11/08/2023  11/30/2023  1. stairs  7 10                    Score 7 10   Total score = sum of the activity scores/number of activities Minimum detectable change (90%CI) for average score = 2 points Minimum detectable change (90%CI) for single activity score = 3 points  EDEMA:   11/08/2023 Mild edema in left knee at eval   LOWER EXTREMITY ROM:   ROM Left Eval 11/08/2023 11/09/23 11/19/23 11/24/2023 Left 11/30/2023  Hip flexion       Hip extension       Hip abduction       Hip adduction       Hip internal rotation       Hip external rotation       Knee flexion A:95 P:100 A: 100 P: 102 A: 105 P: 108 Active 106 AROM heel slide 112  Knee extension A:10 P:5 A: 6 P; 5 A: 5 P: 3 Active lacks 5 Seated AROM  -3  Ankle dorsiflexion       Ankle plantarflexion       Ankle inversion       Ankle eversion        (Blank rows = not tested)  LOWER EXTREMITY MMT:  MMT Right Eval 11/08/2023 Left Eval 11/08/2023 Left 11/30/2023  Hip flexion  5   Hip extension     Hip abduction     Hip adduction     Hip internal rotation     Hip external rotation     Knee flexion  5   Knee extension 4/5 42#, 49# 4/5 32#, 32# 5/5 43, 42 lbs  Ankle dorsiflexion     Ankle plantarflexion     Ankle inversion     Ankle eversion      (Blank rows = not tested  FUNCTIONAL TESTS:  11/08/2023 Eval:  30 second sit to stand test 7 reps in 30 sec, no UE support from standard chair  GAIT: 11/08/2023 Eval Comments: ambulates into clinic with [facility], relays he can walk in home without AD and did not use one PLOF                   TODAY'S TREATMENT:        DATE:  12/02/2023 Therex: UBE LE only lvl 4.0 8 mins seat Supine heel slide AROM 5 sec hold with quad set 5 sec hold x 10 combo Seated LAQ Lt leg x 10 with pause in end ranges each way Time spent in review of HEP and updates.    TODAY'S TREATMENT:        DATE:  11/30/2023 Therex: UBE LE only lvl 4.0 8 mins seat Supine heel slide AROM 5 sec hold with quad set 5 sec hold x 10 combo Seated LAQ Lt leg x 10 with pause in end ranges each way Time spent in review of HEP and updates.     TherActivity Sit to stand to sit 19 inch table height x 10 without UE assist c cues for home.  Flight of stairs reciprocal gait up/down for 2 steps  then WB on Lt leg lowering and up due to Rt knee pains.  Lateral step WB Lt leg 6 inch with cues for home.  X 10 in clinic.  TODAY'S TREATMENT:        DATE: 11/24/2023 Recumbent bike Seat 8 Level 3-5 for 5 minutes Quad sets with hell prop 2 sets of 10 for 5 seconds Supine knee flexion with strap 10 x 10 seconds  Functional Activities: Double Leg Press 87# 15 x full extension and slow eccentrics Single Leg Press 50# 10 x full extension and slow eccentrics Step-down off 4 and 6 inch step 10 x each slow eccentrics  Neuromuscular re-education: Tandem balance eyes open; head turning 4 x 20 seconds each Dynamic tandem 2 laps forward and back Dynamic slow marching 4 laps  Vaso left knee 10 minutes Medium Pressure 34*   TODAY'S TREATMENT:        DATE:11/19/2023 Therapeutic Exercise: Scifit Seat 12 full revolutions x 5 min Prone quad stretch with strap 2x30" Prone knee hang x2 min with self overpressure Quad set with PT overpressure 2x10 SLR 2x10 Seated knee flexion self stretch x10 Rechecked ROM  Therapeutic Activity: Fwd step up 6" step 2x10 Side step up and over 6" step 2x10 Single leg heel raise 2x10 Tandem stance x30"  Manual Therapy: STM & TPR L quad IASTM L quad   TODAY'S TREATMENT:        DATE:11/17/2023 Therapeutic Exercise:  Recumbent bike seat 6 rocking flexion stretch 2.5 min, full revolutions backwards 1.5 minutes, then forward full revolutions level 1 4 min Gastroc stretch step forefoot weight bearing 30 sec 3 reps Hamstring stretch 1 rep seated edge of chair and 1 rep LLE long sitting - strap DF 30 sec hold LAQ LLE 10 reps  Therapeutic Activities: PT demo & verbal cues weight shift upon arising to load knee.  Pt return demo 5 reps. Gait initiated with less decreased stance duration.  Leg press BLEs 75# 15 reps cues on max ext & flex;  single LLE 43# 10 reps 2 sets Tandem stance 30 sec LLE in front & in back; 1st set floor eyes open with only 1-2 touches;  2nd set floor  Manual Therapy: PROM with overpressure seated knee flexion   Vaso: Vasopneumatic: medium compression, 34 deg, x 10 minutes with elevation.     PATIENT EDUCATION: 11/08/2023 Education details: HEP, PT plan of care Person educated: Patient Education method: Explanation, Demonstration, Verbal cues, and Handouts Education comprehension: verbalized understanding and needs further education   HOME EXERCISE PROGRAM: Access Code: [email or web address] URL: [email or web address] Date: 11/30/2023 Prepared by: Bonna Bustard  Exercises - Supine Quad Set  - 3 x daily - 6 x weekly - 1 sets - 10 reps - 5 sec hold - Supine Heel Slide (Mirrored)  - 2-3 x daily - 7 x weekly - 1-2 sets - 10 reps - 2 hold - Supine Hamstring Stretch with Strap  - 1-2 x daily - 6 x weekly - 1 sets - 3 reps - 30 sec hold - Seated Knee Flexion Extension AAROM with Overpressure  - 3 x daily - 6 x weekly - 1 sets - 10 reps - 5 sec hold - Seated Straight Leg Raise with Quad Contraction  - 1-2 x daily - 6 x weekly - 2-3 sets - 10-15 reps - Sit to Stand Without Arm Support  - 1-2 x daily - 6 x weekly - 1-2 sets - 10 reps - standing calf stretch with forefoot on small step or brick  - 1-2 x daily - 7 x weekly - 1 sets - 3 reps - 30 seconds hold - Tandem Stance  - 1 x daily - 7  x weekly - 3 sets - 2 reps - 30 seconds hold - Seated Table Hamstring Stretch  - 1-2 x daily - 7 x weekly - 1 sets - 3 reps - 30 seconds hold - Prone Quadriceps Stretch with Strap  - 1 x daily - 7 x weekly - 2 sets - 30 sec hold   ASSESSMENT:  CLINICAL IMPRESSION:  *** The patient has attended 6 visits over the course of treatment cycle.  Patient has reported overall improvement at almost 100%. See objective data above for updated information regarding current presentation.  Pt indicated ability to perform reciprocal gait pattern on stairs and doing things around the house better.  At this time, reviewed HEP and introduced idea of HEP  transitioning when appropriate.    OBJECTIVE IMPAIRMENTS: decreased activity tolerance, difficulty walking, decreased balance, decreased endurance, decreased mobility, decreased ROM, decreased strength, impaired flexibility, impaired LE use, and pain.  ACTIVITY LIMITATIONS: bending, lifting, carry, locomotion, cleaning, community activity, stairs  PERSONAL FACTORS: post op status, are also affecting patient's functional outcome.  REHAB POTENTIAL: Good  CLINICAL DECISION MAKING: Stable/uncomplicated  EVALUATION COMPLEXITY: Low    GOALS: Short term PT Goals Target date: 12/06/2023   Pt will be I and compliant with HEP. Baseline:  Goal status: Met 11/24/2023   Long term PT goals Target date:  12/20/2023   Pt will improve left knee PROM to 0-120 deg and AROM 0-110 deg to improve functional mobility Baseline: Goal status: on going 11/30/2023 Pt will improve  hip/knee strength to at least 5/5 MMT or >50# on HHD for knee extensor strength to improve functional strength Baseline: Goal status:  on going 11/30/2023 Pt will improve PSFS to at least 9/10 functional to show improved function Baseline: Goal status:  Met  11/30/2023 Pt will improve 30 second sit to stand test to 10 reps in 30 sec, no UE support from standard chair, to show improved functional strength Baseline: Goal status:  on going 11/30/2023 Pt will reduce pain to overall less than 2-3/10 with usual activity Baseline: Goal status: Met 11/30/2023  PLAN: PT FREQUENCY: 1-3 times per week   PT DURATION: 4-6 weeks  PLANNED INTERVENTIONS (unless contraindicated):  97110-Therapeutic exercises, 97530- Therapeutic activity, 97112- Neuromuscular re-education, 97535- Self Care, and 78295- Manual therapy vasopnumatic device  PLAN FOR NEXT SESSION: HEP transitioning when Pt in agreement.     Aceton Kinnear April Ma L Roslynn Holte, PT, DPT 12/02/23  7:58 AM     Referring diagnosis? A21.308 (ICD-10-CM) - Status post total left knee  replacement Treatment diagnosis? (if different than referring diagnosis) m25.562 What was this (referring dx) caused by? [x]  Surgery []  Fall []  Ongoing issue []  Arthritis []  Other: ____________  Laterality: []  Rt [x]  Lt []  Both  Check all possible CPT codes:  *CHOOSE 10 OR LESS*    See Planned Interventions listed in the Plan section of the Evaluation.

## 2023-12-08 ENCOUNTER — Encounter: Admitting: Rehabilitative and Restorative Service Providers"

## 2023-12-09 ENCOUNTER — Ambulatory Visit (INDEPENDENT_AMBULATORY_CARE_PROVIDER_SITE_OTHER): Admitting: Physician Assistant

## 2023-12-09 ENCOUNTER — Encounter: Payer: Self-pay | Admitting: Physician Assistant

## 2023-12-09 DIAGNOSIS — Z96652 Presence of left artificial knee joint: Secondary | ICD-10-CM

## 2023-12-09 NOTE — Progress Notes (Signed)
 HPI: [name] returns today follow-up status post left total knee arthroplasty 1425.  He is overall doing well.  He states he has no pain in the knee.  Taking no medications.  He is still going to therapy and feels that this has been beneficial.  He states that his knee is night and day since having surgery.  He has some pain in his right knee which she has known arthritis which is end-stage medial compartment.  At this point in time is not ready for an injection in the right hip.  Denies any fevers chills.   Physical exam: Left knee full extension flexion to 115 degrees no instability valgus varus stressing.  Surgical incisions well-healed.  Calf supple nontender.  Ambulates without any assistive device.  Impression: Status post left total knee arthroplasty  Plan: He will continue to work on strengthening.  Work on Social research officer, government.  Follow-up with us  at the 28-month mark and at that time we will obtain AP and lateral views of the left knee.  Follow-up sooner if there is any questions concerns.  Questions were encouraged and answered at length

## 2023-12-10 ENCOUNTER — Encounter: Admitting: Rehabilitative and Restorative Service Providers"

## 2023-12-10 ENCOUNTER — Telehealth: Payer: Self-pay | Admitting: Rehabilitative and Restorative Service Providers"

## 2023-12-10 NOTE — Telephone Encounter (Signed)
 Left VM with [phone] number to call and schedule additional visits if needed.

## 2023-12-13 ENCOUNTER — Other Ambulatory Visit: Payer: Self-pay | Admitting: Physician Assistant

## 2023-12-31 ENCOUNTER — Ambulatory Visit: Admitting: Sports Medicine

## 2023-12-31 ENCOUNTER — Encounter: Payer: Self-pay | Admitting: Sports Medicine

## 2023-12-31 DIAGNOSIS — I619 Nontraumatic intracerebral hemorrhage, unspecified: Secondary | ICD-10-CM | POA: Diagnosis not present

## 2023-12-31 DIAGNOSIS — M25561 Pain in right knee: Secondary | ICD-10-CM

## 2023-12-31 DIAGNOSIS — G8929 Other chronic pain: Secondary | ICD-10-CM

## 2023-12-31 DIAGNOSIS — M1711 Unilateral primary osteoarthritis, right knee: Secondary | ICD-10-CM | POA: Diagnosis not present

## 2023-12-31 MED ORDER — BUPIVACAINE HCL 0.25 % IJ SOLN
2.0000 mL | INTRAMUSCULAR | Status: AC | PRN
  Administered 2023-12-31: 2 mL via INTRA_ARTICULAR

## 2023-12-31 MED ORDER — LIDOCAINE HCL 1 % IJ SOLN
2.0000 mL | INTRAMUSCULAR | Status: AC | PRN
Start: 2023-12-31 — End: 2023-12-31
  Administered 2023-12-31: 2 mL

## 2023-12-31 MED ORDER — METHYLPREDNISOLONE ACETATE 40 MG/ML IJ SUSP
80.0000 mg | INTRAMUSCULAR | Status: AC | PRN
  Administered 2023-12-31: 80 mg via INTRA_ARTICULAR

## 2023-12-31 NOTE — Progress Notes (Signed)
 Patient says that his left knee is doing much better since the replacement. He says that the right knee has also been doing well, although his pain has returned in the last week. He says that it does feel similar to the way it did after the last injection in January. He has not noticed any swelling in the right knee and denies any new injuries. He is here today for repeat injection.

## 2023-12-31 NOTE — Progress Notes (Signed)
 Kenneth Harper - 83 y.o. male MRN [id number]  Date of birth: 1941-03-16  Office Visit Note: Visit Date: 12/31/2023 PCP: Jearldine Mina, MD Referred by: Jearldine Mina, MD  Subjective: Chief Complaint  Patient presents with   Right Knee - Pain   HPI: Kenneth Harper is a pleasant 83 y.o. male who presents today for acute on chronic right knee pain with advanced OA.  Kenneth Harper is status post left knee total arthroplasty on 10/21/2023 with Dr. Lucienne Ryder.  He is doing excellent after this knee replacement and was able to transition out of formalized physical therapy to home exercise early.  He continues walking for exercise.  The right knee just recently started to become exacerbated over the last 1-2 weeks.  He does not note any swelling within the knee.  The last time we performed a right knee injection was back in January 2025.  No new injury.  Pertinent ROS were reviewed with the patient and found to be negative unless otherwise specified above in HPI.   Assessment & Plan: Visit Diagnoses:  1. Unilateral primary osteoarthritis, right knee   2. Chronic pain of right knee   3. Hemorrhagic stroke [facility])    Plan: Impression is chronic right knee pain with osteoarthritis with recent exacerbation of his pain.  He does have advanced medial tibiofemoral arthritic change but has done well with about 5 months of relief from previous corticosteroid injection.  Through shared decision making, did proceed with repeat right knee corticosteroid injection, patient tolerated well.  Advised on postinjection protocol.  In the past with one of our PAs he was placed on meloxicam  7.5 mg to take as needed, I did discuss given his history of hemorrhagic stroke I would like him to discontinue meloxicam  as well as all NSAIDs going forward.  He is understandable and agreeable.  He will continue walking with generalized physical activity for both of his knees.  He will follow-up with me as needed.  Follow-up: Return if  symptoms worsen or fail to improve.   Meds & Orders: No orders of the defined types were placed in this encounter.   Orders Placed This Encounter  Procedures   Large Joint Inj: R knee     Procedures: Large Joint Inj: R knee on 12/31/2023 9:02 AM Details: 22 G 1.5 in needle, anterolateral approach Medications: 2 mL lidocaine  1 %; 2 mL bupivacaine  0.25 %; 80 mg methylPREDNISolone  acetate 40 MG/ML Outcome: tolerated well, no immediate complications  Knee Injection, Right: After discussion on risks/benefits/indications, informed verbal consent was obtained and a timeout was performed, patient was seated on exam table. The patient's knee was prepped with Betadine  and alcohol swab and utilizing anterolateral approach, the patient's knee was injected intraarticularly with 2:2:1 lidocaine  1%:bupivicaine 0.25%:depomedrol. Patient tolerated the procedure well without immediate complications.  Procedure, treatment alternatives, risks and benefits explained, specific risks discussed. Consent was given by the patient. Patient was prepped and draped in the usual sterile fashion.          Clinical History: No specialty comments available.  He reports that he quit smoking about 13 years ago. His smoking use included cigars. He has never used smokeless tobacco. No results for input(s): HGBA1C, LABURIC in the last 8760 hours.  Objective:    Physical Exam  Gen: Well-appearing, in no acute distress; non-toxic CV: Well-perfused. Warm.  Resp: Breathing unlabored on room air; no wheezing. Psych: Fluid speech in conversation; appropriate affect; normal thought process  Ortho Exam - Right knee: There  is no redness swelling or effusion within the knee joint.  Mild TTP over the medial tibiofemoral space.  Range of motion from 0-100/105 degrees.  There is patellar femoral crepitus noted.  Well-healed incision from his prior TKA on the left knee with good range of motion without pain.  Imaging:  *2  view x-ray of the left and right knee including AP standing and lateral from from 09/09/2023 was independently reviewed and interpreted by myself today.  X-rays demonstrate severe medial tibiofemoral joint space narrowing with bony sclerosis and advanced osteoarthritis.  X-ray imaging of the right knee from 06/2018 shows at least moderate patellofemoral arthritis as well.  Past Medical/Family/Surgical/Social History: Medications & Allergies reviewed per EMR, new medications updated. Patient Active Problem List   Diagnosis Date Noted   Status post total left knee replacement 10/21/2023   Unilateral primary osteoarthritis, left knee 10/20/2023   Essential hypertension 08/13/2020   Transaminitis    Steroid-induced hyperglycemia    Prediabetes    Contusion of right knee    Thalamic hemorrhage with stroke (HCC) 08/06/2020   Hemorrhagic stroke (HCC)    Memory loss    Dyslipidemia    Elevated blood pressure reading    AKI (acute kidney injury) (HCC)    Primary osteoarthritis of right knee    ICH (intracerebral hemorrhage) (HCC) 07/31/2020   Bilateral primary osteoarthritis of knee 08/10/2019   Carpal tunnel syndrome on both sides 07/28/2018   Lumbar herniated disc 10/01/2015   Herniation of lumbar intervertebral disc with radiculopathy 09/30/2015    Class: Acute   Past Medical History:  Diagnosis Date   Arthritis    knees and back   Asthma    childhood   Carpal tunnel syndrome    Cataract    Diverticulitis    GERD (gastroesophageal reflux disease)    Hypercholesterolemia    Memory loss    Stroke (HCC) 07/31/2020   Family History  Problem Relation Age of Onset   Parkinson's disease Mother    Heart failure Father    Diabetes Brother    Cancer Brother        throat   Hydrocephalus Brother    Diabetes Brother    Migraines Sister    Past Surgical History:  Procedure Laterality Date   BILATERAL CARPAL TUNNEL RELEASE     CATARACT EXTRACTION, BILATERAL Bilateral    COLONOSCOPY  WITH PROPOFOL  N/A 10/29/2014   Procedure: COLONOSCOPY WITH PROPOFOL ;  Surgeon: Garrett Kallman, MD;  Location: [facility];  Service: Endoscopy;  Laterality: N/A;   EYE SURGERY     LUMBAR LAMINECTOMY/DECOMPRESSION MICRODISCECTOMY N/A 10/01/2015   Procedure: LEFT L2-3 MICRODISCECTOMY;  Surgeon: Alphonso Jean, MD;  Location: [facility];  Service: Orthopedics;  Laterality: N/A;   TOTAL KNEE ARTHROPLASTY Left 10/21/2023   Procedure: ARTHROPLASTY, KNEE, TOTAL;  Surgeon: Arnie Lao, MD;  Location: [facility];  Service: Orthopedics;  Laterality: Left;   Social History   Occupational History   Not on file  Tobacco Use   Smoking status: Former    Types: Cigars    Quit date: 10/22/2010    Years since quitting: 13.2   Smokeless tobacco: Never  Vaping Use   Vaping status: Never Used  Substance and Sexual Activity   Alcohol use: No    Alcohol/week: 0.0 standard drinks of alcohol   Drug use: No   Sexual activity: Never    Birth control/protection: Abstinence

## 2024-01-10 ENCOUNTER — Telehealth: Payer: Self-pay | Admitting: Orthopaedic Surgery

## 2024-01-10 NOTE — Telephone Encounter (Signed)
 Patient called and wanted to know if its ok for dental treatment after 2 months in from surgery? [phone]

## 2024-01-11 NOTE — Telephone Encounter (Signed)
 Patient aware we do antibiotics for 3 months post surgery

## 2024-01-11 NOTE — Discharge Summary (Signed)
 Physician Discharge Summary      Patient ID: [name] MRN: [id number] DOB/AGE: 82-Jun-1942 82 y.o.  Admit date: 10/21/2023 Discharge date: 10/24/23  Admission Diagnoses:  Principal Problem:   Unilateral primary osteoarthritis, left knee Active Problems:   Status post total left knee replacement   Discharge Diagnoses:  Same  Surgeries: Procedure(s): ARTHROPLASTY, KNEE, TOTAL on 10/21/2023   Consultants:   Discharged Condition: Stable  Hospital Course: Kenneth Harper is an 83 y.o. male who was admitted 10/21/2023 with a chief complaint of left knee pain, and found to have a diagnosis of left knee arthritis.  They were brought to the operating room on 10/21/2023 and underwent the above named procedures.  Pt awoke from anesthesia without complication and was transferred to the floor. On POD1, patient's pain was overall controlled but his mobility was limited.  This improved over the course of his stay to the point that he was able to ambulate about 250 feet with PT on POD 3.  He was confident enough for discharge home at that point.  Discharge home on POD 3.  No red flag signs or symptoms throughout his stay..  Pt will f/u with Dr. Vernetta in clinic in ~2 weeks.   Antibiotics given:  Anti-infectives (From admission, onward)    Start     Dose/Rate Route Frequency Ordered Stop   10/21/23 1400  ceFAZolin  (ANCEF ) IVPB 2g/100 mL premix        2 g 200 mL/hr over 30 Minutes Intravenous Every 6 hours 10/21/23 1109 10/22/23 0700   10/21/23 0600  ceFAZolin  (ANCEF ) IVPB 2g/100 mL premix        2 g 200 mL/hr over 30 Minutes Intravenous On call to O.R. 10/21/23 9464 10/21/23 0741     .  Recent vital signs:  Vitals:   10/23/23 2212 10/24/23 0514  BP: 121/60 115/63  Pulse: 88 92  Resp: 18 15  Temp: 98.6 F (37 C) 99.1 F (37.3 C)  SpO2: 96% 97%    Recent laboratory studies:  Results for orders placed or performed during the hospital encounter of 10/21/23  CBC   Collection  Time: 10/22/23  7:09 AM  Result Value Ref Range   WBC 7.2 4.0 - 10.5 K/uL   RBC 3.76 (L) 4.22 - 5.81 MIL/uL   Hemoglobin 10.4 (L) 13.0 - 17.0 g/dL   HCT 65.9 (L) 60.9 - 47.9 %   MCV 90.4 80.0 - 100.0 fL   MCH 27.7 26.0 - 34.0 pg   MCHC 30.6 30.0 - 36.0 g/dL   RDW 83.5 (H) 88.4 - 84.4 %   Platelets 211 150 - 400 K/uL   nRBC 0.0 0.0 - 0.2 %  Basic metabolic panel   Collection Time: 10/22/23  7:09 AM  Result Value Ref Range   Sodium 137 135 - 145 mmol/L   Potassium 4.3 3.5 - 5.1 mmol/L   Chloride 105 98 - 111 mmol/L   CO2 26 22 - 32 mmol/L   Glucose, Bld 137 (H) 70 - 99 mg/dL   BUN 21 8 - 23 mg/dL   Creatinine, Ser 8.82 0.61 - 1.24 mg/dL   Calcium  8.3 (L) 8.9 - 10.3 mg/dL   GFR, Estimated >39 >39 mL/min   Anion gap 6 5 - 15    Discharge Medications:   Allergies as of 10/24/2023   No Known Allergies      Medication List     TAKE these medications    acetaminophen  325 MG tablet Commonly known  as: TYLENOL  Take 1-2 tablets (325-650 mg total) by mouth every 4 (four) hours as needed for mild pain.   amLODipine  5 MG tablet Commonly known as: NORVASC  Take 1 tablet (5 mg total) by mouth daily. What changed: how much to take   aspirin  81 MG chewable tablet Chew 1 tablet (81 mg total) by mouth 2 (two) times daily.   atorvastatin  40 MG tablet Commonly known as: LIPITOR Take 40 mg by mouth daily.   buPROPion  300 MG 24 hr tablet Commonly known as: WELLBUTRIN  XL Take 300 mg by mouth daily.   CALTRATE 600+D PO Take 1 tablet by mouth daily.   chlorhexidine  4 % external liquid Commonly known as: HIBICLENS  Apply 15 mLs (1 Application total) topically as directed for 30 doses. Use as directed daily for 5 days every other week for 6 weeks.   donepezil  10 MG tablet Commonly known as: ARICEPT  Take 10 mg by mouth at bedtime.   ibuprofen 200 MG tablet Commonly known as: ADVIL Take 400 mg by mouth every 6 (six) hours as needed for mild pain (pain score 1-3) or moderate pain  (pain score 4-6).   multivitamin with minerals tablet Take 1 tablet by mouth daily. Centrum Silver   oxyCODONE  5 MG immediate release tablet Commonly known as: Oxy IR/ROXICODONE  Take 1-2 tablets (5-10 mg total) by mouth every 6 (six) hours as needed for moderate pain (pain score 4-6) (pain score 4-6).       ASK your doctor about these medications    mupirocin  ointment 2 % Commonly known as: BACTROBAN  Place 1 Application into the nose 2 (two) times daily for 60 doses. Use as directed 2 times daily for 5 days every other week for 6 weeks. Ask about: Should I take this medication?        Diagnostic Studies: No results found.  Disposition: Discharge disposition: 01-Home or Self Care       Discharge Instructions     Call MD / Call 911   Complete by: As directed    If you experience chest pain or shortness of breath, CALL 911 and be transported to the hospital emergency room.  If you develope a fever above 101 F, pus (white drainage) or increased drainage or redness at the wound, or calf pain, call your surgeon's office.   Constipation Prevention   Complete by: As directed    Drink plenty of fluids.  Prune juice may be helpful.  You may use a stool softener, such as Colace (over the counter) 100 mg twice a day.  Use MiraLax  (over the counter) for constipation as needed.   Diet - low sodium heart healthy   Complete by: As directed    Increase activity slowly as tolerated   Complete by: As directed    Post-operative opioid taper instructions:   Complete by: As directed    POST-OPERATIVE OPIOID TAPER INSTRUCTIONS: It is important to wean off of your opioid medication as soon as possible. If you do not need pain medication after your surgery it is ok to stop day one. Opioids include: Codeine , Hydrocodone (Norco, Vicodin), Oxycodone (Percocet, oxycontin ) and hydromorphone  amongst others.  Long term and even short term use of opiods can cause: Increased pain  response Dependence Constipation Depression Respiratory depression And more.  Withdrawal symptoms can include Flu like symptoms Nausea, vomiting And more Techniques to manage these symptoms Hydrate well Eat regular healthy meals Stay active Use relaxation techniques(deep breathing, meditating, yoga) Do Not substitute Alcohol to help with tapering If  you have been on opioids for less than two weeks and do not have pain than it is ok to stop all together.  Plan to wean off of opioids This plan should start within one week post op of your joint replacement. Maintain the same interval or time between taking each dose and first decrease the dose.  Cut the total daily intake of opioids by one tablet each day Next start to increase the time between doses. The last dose that should be eliminated is the evening dose.           Follow-up Information     [facility] Follow up.   Specialty: Home Health Services Why: to provide home physical therpay visits Contact information: [address] [place] [address] [phone]                  Signed: Herlene Calix 01/11/2024, 3:53 PM

## 2024-03-01 DIAGNOSIS — R7303 Prediabetes: Secondary | ICD-10-CM | POA: Diagnosis not present

## 2024-03-01 DIAGNOSIS — N400 Enlarged prostate without lower urinary tract symptoms: Secondary | ICD-10-CM | POA: Diagnosis not present

## 2024-03-01 DIAGNOSIS — E7800 Pure hypercholesterolemia, unspecified: Secondary | ICD-10-CM | POA: Diagnosis not present

## 2024-03-01 DIAGNOSIS — F0150 Vascular dementia without behavioral disturbance: Secondary | ICD-10-CM | POA: Diagnosis not present

## 2024-03-01 DIAGNOSIS — Z8673 Personal history of transient ischemic attack (TIA), and cerebral infarction without residual deficits: Secondary | ICD-10-CM | POA: Diagnosis not present

## 2024-03-01 DIAGNOSIS — Z1331 Encounter for screening for depression: Secondary | ICD-10-CM | POA: Diagnosis not present

## 2024-03-01 DIAGNOSIS — R278 Other lack of coordination: Secondary | ICD-10-CM | POA: Diagnosis not present

## 2024-03-01 DIAGNOSIS — I10 Essential (primary) hypertension: Secondary | ICD-10-CM | POA: Diagnosis not present

## 2024-03-01 DIAGNOSIS — R209 Unspecified disturbances of skin sensation: Secondary | ICD-10-CM | POA: Diagnosis not present

## 2024-03-01 DIAGNOSIS — F331 Major depressive disorder, recurrent, moderate: Secondary | ICD-10-CM | POA: Diagnosis not present

## 2024-03-01 DIAGNOSIS — Z0000 Encounter for general adult medical examination without abnormal findings: Secondary | ICD-10-CM | POA: Diagnosis not present

## 2024-03-01 DIAGNOSIS — M109 Gout, unspecified: Secondary | ICD-10-CM | POA: Diagnosis not present

## 2024-03-01 DIAGNOSIS — N182 Chronic kidney disease, stage 2 (mild): Secondary | ICD-10-CM | POA: Diagnosis not present

## 2024-03-08 ENCOUNTER — Telehealth: Payer: Self-pay

## 2024-03-08 NOTE — Telephone Encounter (Signed)
 Dentist aware he does not have to premed

## 2024-04-03 DIAGNOSIS — I10 Essential (primary) hypertension: Secondary | ICD-10-CM | POA: Diagnosis not present

## 2024-04-06 DIAGNOSIS — Z860100 Personal history of colon polyps, unspecified: Secondary | ICD-10-CM | POA: Diagnosis not present

## 2024-04-17 ENCOUNTER — Encounter: Payer: Self-pay | Admitting: Sports Medicine

## 2024-04-17 ENCOUNTER — Ambulatory Visit: Admitting: Sports Medicine

## 2024-04-17 DIAGNOSIS — M1711 Unilateral primary osteoarthritis, right knee: Secondary | ICD-10-CM

## 2024-04-17 DIAGNOSIS — M25561 Pain in right knee: Secondary | ICD-10-CM | POA: Diagnosis not present

## 2024-04-17 DIAGNOSIS — G8929 Other chronic pain: Secondary | ICD-10-CM | POA: Diagnosis not present

## 2024-04-17 MED ORDER — BUPIVACAINE HCL 0.25 % IJ SOLN
2.0000 mL | INTRAMUSCULAR | Status: AC | PRN
  Administered 2024-04-17: 2 mL via INTRA_ARTICULAR

## 2024-04-17 MED ORDER — METHYLPREDNISOLONE ACETATE 40 MG/ML IJ SUSP
80.0000 mg | INTRAMUSCULAR | Status: AC | PRN
  Administered 2024-04-17: 80 mg via INTRA_ARTICULAR

## 2024-04-17 MED ORDER — LIDOCAINE HCL 1 % IJ SOLN
2.0000 mL | INTRAMUSCULAR | Status: AC | PRN
  Administered 2024-04-17: 2 mL

## 2024-04-17 NOTE — Progress Notes (Signed)
 Patient says that he did well with the last right knee injection, and his pain began to return about 3-4 weeks ago. He says that his left knee is still doing well. He denies any noticeable swelling in the right knee or new injury. He is here today for repeat injection.

## 2024-04-17 NOTE — Progress Notes (Signed)
 Kenneth Harper - 83 y.o. male MRN [id number]  Date of birth: March 22, 1941  Office Visit Note: Visit Date: 04/17/2024 PCP: Ransom Other, MD Referred by: Ransom Other, MD  Subjective: Chief Complaint  Patient presents with   Right Knee - Pain   HPI: Kenneth Harper is a pleasant 83 y.o. male who presents today for acute on chronic right knee pain with known OA.  Raphael has been doing well with the right knee and has many months relief from intermittent corticosteroid injections.  The right knee started becoming painful again over the last 3-4 weeks.  He has been moving and lifting a lot of boxes transitioning from his old home to his new home, which he feels exacerbated his pain.  He is still doing well from a left total knee arthroplasty with Dr. Vernetta earlier this year.  He uses Tylenol  and ice as needed for some relief.  He is pre-diabetic. Lab Results  Component Value Date   HGBA1C 5.8 (H) 08/01/2020   Pertinent ROS were reviewed with the patient and found to be negative unless otherwise specified above in HPI.   Assessment & Plan: Visit Diagnoses:  1. Unilateral primary osteoarthritis, right knee   2. Chronic pain of right knee    Plan: Impression is right knee advanced osteoarthritis with current exacerbation.  He has done well with infrequent corticosteroid injection in the past, we did perform right knee injection today, patient tolerated well.  Advised on postinjection protocol.  May use ice/heat and/or Tylenol  for any pain going forward.  He will continue remaining active with walking and everyday activity.  He is not at a point where he is considering knee arthroplasty, he will follow-up with me as needed.  Follow-up: Return if symptoms worsen or fail to improve.   Meds & Orders: No orders of the defined types were placed in this encounter.   Orders Placed This Encounter  Procedures   Large Joint Inj: R knee     Procedures: Large Joint Inj: R knee on 04/17/2024  1:29 PM Indications: pain Details: 22 G 1.5 in needle, anterolateral approach Medications: 2 mL lidocaine  1 %; 2 mL bupivacaine  0.25 %; 80 mg methylPREDNISolone  acetate 40 MG/ML Outcome: tolerated well, no immediate complications  Knee Injection, Right: After discussion on risks/benefits/indications, informed verbal consent was obtained and a timeout was performed, patient was seated on exam table. The patient's knee was prepped with Betadine  and alcohol swab and utilizing anterolateral approach, the patient's knee was injected intraarticularly with 2:2:2 lidocaine  1%:bupivicaine 0.25%:depomedrol. Patient tolerated the procedure well without immediate complications.  Procedure, treatment alternatives, risks and benefits explained, specific risks discussed. Consent was given by the patient. Patient was prepped and draped in the usual sterile fashion.          Clinical History: No specialty comments available.  He reports that he quit smoking about 13 years ago. His smoking use included cigars. He has never used smokeless tobacco. No results for input(s): HGBA1C, LABURIC in the last 8760 hours.  Objective:    Physical Exam  Gen: Well-appearing, in no acute distress; non-toxic CV: Well-perfused. Warm.  Resp: Breathing unlabored on room air; no wheezing. Psych: Fluid speech in conversation; appropriate affect; normal thought process  Ortho Exam - Right knee: There is no specific redness swelling or effusion within the knee joint.  There is some bony bossing present.  Positive TTP over the medial joint line.  Range of motion 0-100 degrees.  There is notable patellofemoral crepitus.  Imaging:  *2 view x-ray of the left and right knee including AP standing and lateral from from 09/09/2023 was independently reviewed and interpreted by myself today.  X-rays demonstrate severe medial tibiofemoral joint space narrowing with bony sclerosis and advanced osteoarthritis.  X-ray imaging of the  right knee from 06/2018 shows at least moderate patellofemoral arthritis as well.    Past Medical/Family/Surgical/Social History: Medications & Allergies reviewed per EMR, new medications updated. Patient Active Problem List   Diagnosis Date Noted   Status post total left knee replacement 10/21/2023   Unilateral primary osteoarthritis, left knee 10/20/2023   Essential hypertension 08/13/2020   Transaminitis    Steroid-induced hyperglycemia    Prediabetes    Contusion of right knee    Thalamic hemorrhage with stroke (HCC) 08/06/2020   Hemorrhagic stroke (HCC)    Memory loss    Dyslipidemia    Elevated blood pressure reading    AKI (acute kidney injury)    Primary osteoarthritis of right knee    ICH (intracerebral hemorrhage) (HCC) 07/31/2020   Bilateral primary osteoarthritis of knee 08/10/2019   Carpal tunnel syndrome on both sides 07/28/2018   Lumbar herniated disc 10/01/2015   Herniation of lumbar intervertebral disc with radiculopathy 09/30/2015    Class: Acute   Past Medical History:  Diagnosis Date   Arthritis    knees and back   Asthma    childhood   Carpal tunnel syndrome    Cataract    Diverticulitis    GERD (gastroesophageal reflux disease)    Hypercholesterolemia    Memory loss    Stroke (HCC) 07/31/2020   Family History  Problem Relation Age of Onset   Parkinson's disease Mother    Heart failure Father    Diabetes Brother    Cancer Brother        throat   Hydrocephalus Brother    Diabetes Brother    Migraines Sister    Past Surgical History:  Procedure Laterality Date   BILATERAL CARPAL TUNNEL RELEASE     CATARACT EXTRACTION, BILATERAL Bilateral    COLONOSCOPY WITH PROPOFOL  N/A 10/29/2014   Procedure: COLONOSCOPY WITH PROPOFOL ;  Surgeon: Gladis MARLA Louder, MD;  Location: [facility];  Service: Endoscopy;  Laterality: N/A;   EYE SURGERY     LUMBAR LAMINECTOMY/DECOMPRESSION MICRODISCECTOMY N/A 10/01/2015   Procedure: LEFT L2-3 MICRODISCECTOMY;   Surgeon: Lynwood FORBES Better, MD;  Location: [facility] OR;  Service: Orthopedics;  Laterality: N/A;   TOTAL KNEE ARTHROPLASTY Left 10/21/2023   Procedure: ARTHROPLASTY, KNEE, TOTAL;  Surgeon: Vernetta Lonni GRADE, MD;  Location: [facility] ORS;  Service: Orthopedics;  Laterality: Left;   Social History   Occupational History   Not on file  Tobacco Use   Smoking status: Former    Types: Cigars    Quit date: 10/22/2010    Years since quitting: 13.4   Smokeless tobacco: Never  Vaping Use   Vaping status: Never Used  Substance and Sexual Activity   Alcohol use: No    Alcohol/week: 0.0 standard drinks of alcohol   Drug use: No   Sexual activity: Never    Birth control/protection: Abstinence

## 2024-04-18 DIAGNOSIS — F331 Major depressive disorder, recurrent, moderate: Secondary | ICD-10-CM | POA: Diagnosis not present

## 2024-04-18 DIAGNOSIS — F0150 Vascular dementia without behavioral disturbance: Secondary | ICD-10-CM | POA: Diagnosis not present

## 2024-04-18 DIAGNOSIS — Z23 Encounter for immunization: Secondary | ICD-10-CM | POA: Diagnosis not present

## 2024-05-02 DIAGNOSIS — D122 Benign neoplasm of ascending colon: Secondary | ICD-10-CM | POA: Diagnosis not present

## 2024-05-02 DIAGNOSIS — Z860101 Personal history of adenomatous and serrated colon polyps: Secondary | ICD-10-CM | POA: Diagnosis not present

## 2024-05-02 DIAGNOSIS — D123 Benign neoplasm of transverse colon: Secondary | ICD-10-CM | POA: Diagnosis not present

## 2024-05-02 DIAGNOSIS — K5730 Diverticulosis of large intestine without perforation or abscess without bleeding: Secondary | ICD-10-CM | POA: Diagnosis not present

## 2024-05-02 DIAGNOSIS — D120 Benign neoplasm of cecum: Secondary | ICD-10-CM | POA: Diagnosis not present

## 2024-05-02 DIAGNOSIS — K648 Other hemorrhoids: Secondary | ICD-10-CM | POA: Diagnosis not present

## 2024-05-02 DIAGNOSIS — Z09 Encounter for follow-up examination after completed treatment for conditions other than malignant neoplasm: Secondary | ICD-10-CM | POA: Diagnosis not present

## 2024-05-04 DIAGNOSIS — D122 Benign neoplasm of ascending colon: Secondary | ICD-10-CM | POA: Diagnosis not present

## 2024-05-04 DIAGNOSIS — D123 Benign neoplasm of transverse colon: Secondary | ICD-10-CM | POA: Diagnosis not present

## 2024-05-04 DIAGNOSIS — D120 Benign neoplasm of cecum: Secondary | ICD-10-CM | POA: Diagnosis not present

## 2024-05-29 DIAGNOSIS — F331 Major depressive disorder, recurrent, moderate: Secondary | ICD-10-CM | POA: Diagnosis not present

## 2024-05-29 DIAGNOSIS — F0150 Vascular dementia without behavioral disturbance: Secondary | ICD-10-CM | POA: Diagnosis not present

## 2024-05-29 DIAGNOSIS — Z8673 Personal history of transient ischemic attack (TIA), and cerebral infarction without residual deficits: Secondary | ICD-10-CM | POA: Diagnosis not present

## 2024-05-29 DIAGNOSIS — I77810 Thoracic aortic ectasia: Secondary | ICD-10-CM | POA: Diagnosis not present

## 2024-05-29 DIAGNOSIS — N1831 Chronic kidney disease, stage 3a: Secondary | ICD-10-CM | POA: Diagnosis not present

## 2024-05-29 DIAGNOSIS — R7303 Prediabetes: Secondary | ICD-10-CM | POA: Diagnosis not present

## 2024-05-29 DIAGNOSIS — M179 Osteoarthritis of knee, unspecified: Secondary | ICD-10-CM | POA: Diagnosis not present

## 2024-05-29 DIAGNOSIS — I10 Essential (primary) hypertension: Secondary | ICD-10-CM | POA: Diagnosis not present

## 2024-06-10 ENCOUNTER — Encounter (HOSPITAL_BASED_OUTPATIENT_CLINIC_OR_DEPARTMENT_OTHER): Payer: Self-pay | Admitting: Emergency Medicine

## 2024-06-10 ENCOUNTER — Emergency Department (HOSPITAL_BASED_OUTPATIENT_CLINIC_OR_DEPARTMENT_OTHER): Admitting: Radiology

## 2024-06-10 ENCOUNTER — Other Ambulatory Visit (HOSPITAL_BASED_OUTPATIENT_CLINIC_OR_DEPARTMENT_OTHER): Payer: Self-pay

## 2024-06-10 ENCOUNTER — Other Ambulatory Visit: Payer: Self-pay

## 2024-06-10 ENCOUNTER — Emergency Department (HOSPITAL_BASED_OUTPATIENT_CLINIC_OR_DEPARTMENT_OTHER)
Admission: EM | Admit: 2024-06-10 | Discharge: 2024-06-10 | Disposition: A | Discharge status: Home / Self Care | Attending: Emergency Medicine | Admitting: Emergency Medicine

## 2024-06-10 DIAGNOSIS — J40 Bronchitis, not specified as acute or chronic: Secondary | ICD-10-CM | POA: Insufficient documentation

## 2024-06-10 DIAGNOSIS — Z79899 Other long term (current) drug therapy: Secondary | ICD-10-CM | POA: Diagnosis not present

## 2024-06-10 DIAGNOSIS — J45909 Unspecified asthma, uncomplicated: Secondary | ICD-10-CM | POA: Insufficient documentation

## 2024-06-10 DIAGNOSIS — I10 Essential (primary) hypertension: Secondary | ICD-10-CM | POA: Insufficient documentation

## 2024-06-10 DIAGNOSIS — J189 Pneumonia, unspecified organism: Secondary | ICD-10-CM

## 2024-06-10 DIAGNOSIS — J181 Lobar pneumonia, unspecified organism: Secondary | ICD-10-CM | POA: Diagnosis not present

## 2024-06-10 DIAGNOSIS — Z7982 Long term (current) use of aspirin: Secondary | ICD-10-CM | POA: Diagnosis not present

## 2024-06-10 DIAGNOSIS — R059 Cough, unspecified: Secondary | ICD-10-CM | POA: Diagnosis not present

## 2024-06-10 LAB — RESP PANEL BY RT-PCR (RSV, FLU A&B, COVID)  RVPGX2
Influenza A by PCR: NEGATIVE
Influenza B by PCR: NEGATIVE
Resp Syncytial Virus by PCR: NEGATIVE
SARS Coronavirus 2 by RT PCR: NEGATIVE

## 2024-06-10 MED ORDER — AZITHROMYCIN 250 MG PO TABS
250.0000 mg | ORAL_TABLET | Freq: Every day | ORAL | 0 refills | Status: AC
  Filled 2024-06-10: qty 6, 5d supply, fill #0

## 2024-06-10 MED ORDER — AZITHROMYCIN 250 MG PO TABS
500.0000 mg | ORAL_TABLET | Freq: Once | ORAL | Status: AC
  Administered 2024-06-10: 500 mg via ORAL
  Filled 2024-06-10: qty 2

## 2024-06-10 MED ORDER — PREDNISONE 20 MG PO TABS
40.0000 mg | ORAL_TABLET | Freq: Once | ORAL | Status: AC
  Administered 2024-06-10: 40 mg via ORAL
  Filled 2024-06-10: qty 2

## 2024-06-10 MED ORDER — AMOXICILLIN-POT CLAVULANATE 875-125 MG PO TABS
1.0000 | ORAL_TABLET | Freq: Two times a day (BID) | ORAL | 0 refills | Status: AC
  Filled 2024-06-10: qty 14, 7d supply, fill #0

## 2024-06-10 MED ORDER — ALBUTEROL SULFATE HFA 108 (90 BASE) MCG/ACT IN AERS
2.0000 | INHALATION_SPRAY | Freq: Once | RESPIRATORY_TRACT | Status: AC
  Administered 2024-06-10: 2 via RESPIRATORY_TRACT
  Filled 2024-06-10: qty 6.7

## 2024-06-10 MED ORDER — PREDNISONE 10 MG PO TABS
40.0000 mg | ORAL_TABLET | Freq: Every day | ORAL | 0 refills | Status: AC
  Filled 2024-06-10: qty 16, 4d supply, fill #0

## 2024-06-10 NOTE — ED Triage Notes (Signed)
 Pt c/o cough with congestion with chest burning when coughing x 2 days Also endorses Bilateral ear stuffiness and nasal drainage for awhile. Denies fever

## 2024-06-10 NOTE — Discharge Instructions (Signed)
 You were seen for your cough in the emergency department.  You likely have bronchitis and a lung infection.  At home, please take the antibiotics (Augmentin and azithromycin) for your lung infection.  Take the prednisone  you were prescribed.  Use your inhaler as needed for shortness of breath or wheezing.    Check your MyChart online for the results of any tests that had not resulted by the time you left the emergency department.   Follow-up with your primary doctor in 2-3 days regarding your visit.    Return immediately to the emergency department if you experience any of the following: Difficulty breathing, high fevers, or any other concerning symptoms.    Thank you for visiting our Emergency Department. It was a pleasure taking care of you today.

## 2024-06-10 NOTE — ED Provider Notes (Signed)
 [place] EMERGENCY DEPARTMENT AT [facility] Provider Note   CSN: [id number] Arrival date & time: 06/10/24  1024     Patient presents with: Cough   SRICHARAN LACOMB is a 83 y.o. male.   83 year old male history of childhood asthma, hypertension, hyperlipidemia, and stroke without any residual deficits who presents to the emergency department cough and congestion.  Has had over a week of congestion.  Says that yesterday started having some cough.  It is productive.  Mild shortness of breath.  Has some chest discomfort whenever he coughs but no chest discomfort when he is not coughing.  No leg swelling.  No history of DVT or PE.  No history of smoking.  No fevers or chills.       Prior to Admission medications   Medication Sig Start Date End Date Taking? Authorizing Provider  acetaminophen  (TYLENOL ) 325 MG tablet Take 1-2 tablets (325-650 mg total) by mouth every 4 (four) hours as needed for mild pain. 08/12/20   Love, Sharlet RAMAN, PA-C  amLODipine  (NORVASC ) 5 MG tablet Take 1 tablet (5 mg total) by mouth daily. Patient taking differently: Take 10 mg by mouth daily. 08/12/20   Love, Sharlet RAMAN, PA-C  aspirin  81 MG chewable tablet Chew 1 tablet (81 mg total) by mouth 2 (two) times daily. 10/23/23   Vernetta Lonni GRADE, MD  atorvastatin  (LIPITOR) 40 MG tablet Take 40 mg by mouth daily.  08/22/14   [provider]  buPROPion  (WELLBUTRIN  XL) 300 MG 24 hr tablet Take 300 mg by mouth daily. 07/30/23   [provider]  Calcium  Carbonate-Vitamin D  (CALTRATE 600+D PO) Take 1 tablet by mouth daily.    [provider]  chlorhexidine  (HIBICLENS ) 4 % external liquid Apply 15 mLs (1 Application total) topically as directed for 30 doses. Use as directed daily for 5 days every other week for 6 weeks. 10/21/23   Vernetta Lonni GRADE, MD  Colchicine  0.6 MG CAPS TAKE 1 CAPSULE(0.6 MG) BY MOUTH DAILY 11/12/23   Persons, Ronal Dragon, PA  donepezil  (ARICEPT ) 10 MG tablet Take 10  mg by mouth at bedtime. 06/02/23   [provider]  ibuprofen (ADVIL) 200 MG tablet Take 400 mg by mouth every 6 (six) hours as needed for mild pain (pain score 1-3) or moderate pain (pain score 4-6).    [provider]  meloxicam  (MOBIC ) 7.5 MG tablet TAKE 1 TABLET(7.5 MG) BY MOUTH DAILY 10/26/23   Persons, Ronal Dragon, PA  Multiple Vitamins-Minerals (MULTIVITAMIN WITH MINERALS) tablet Take 1 tablet by mouth daily. Centrum Silver    [provider]  oxyCODONE  (OXY IR/ROXICODONE ) 5 MG immediate release tablet Take 1-2 tablets (5-10 mg total) by mouth every 6 (six) hours as needed for moderate pain (pain score 4-6) (pain score 4-6). 10/23/23   Vernetta Lonni GRADE, MD    Allergies: Patient has no known allergies.    Review of Systems  Updated Vital Signs BP 119/69 (BP Location: Right Arm)   Pulse 96   Temp 97.9 F (36.6 C) (Oral)   Resp 17   Wt 95.3 kg   SpO2 94%   BMI 30.13 kg/m   Physical Exam Vitals and nursing note reviewed.  Constitutional:      General: He is not in acute distress.    Appearance: He is well-developed.  HENT:     Head: Normocephalic and atraumatic.     Right Ear: External ear normal.     Left Ear: External ear normal.  Nose: Congestion present.  Eyes:     Extraocular Movements: Extraocular movements intact.     Conjunctiva/sclera: Conjunctivae normal.     Pupils: Pupils are equal, round, and reactive to light.  Cardiovascular:     Rate and Rhythm: Normal rate and regular rhythm.     Heart sounds: Normal heart sounds.  Pulmonary:     Effort: Pulmonary effort is normal. No respiratory distress.     Breath sounds: Wheezing and rhonchi (Right-sided inspiratory and expiratory) present.  Musculoskeletal:     Cervical back: Normal range of motion and neck supple.     Right lower leg: No edema.     Left lower leg: No edema.  Skin:    General: Skin is warm and dry.  Neurological:     Mental Status: He is alert. Mental status is  at baseline.  Psychiatric:        Mood and Affect: Mood normal.        Behavior: Behavior normal.     (all labs ordered are listed, but only abnormal results are displayed) Labs Reviewed  RESP PANEL BY RT-PCR (RSV, FLU A&B, COVID)  [id number]    EKG: None  Radiology: DG Chest 2 View Result Date: 06/10/2024 EXAM: 2 VIEW(S) XRAY OF THE CHEST 06/10/2024 11:02:25 AM COMPARISON: None available. CLINICAL HISTORY: Cough FINDINGS: LUNGS AND PLEURA: No focal pulmonary opacity. No pleural effusion. No pneumothorax. HEART AND MEDIASTINUM: No acute abnormality of the cardiac and mediastinal silhouettes. BONES AND SOFT TISSUES: Thoracic degenerative changes. No acute osseous abnormality. IMPRESSION: 1. No acute cardiopulmonary process. Electronically signed by: Norleen Kil MD 06/10/2024 11:35 AM EST RP Workstation: [id number]     Procedures   Medications Ordered in the ED  predniSONE  (DELTASONE ) tablet 40 mg (has no administration in time range)  azithromycin (ZITHROMAX) tablet 500 mg (has no administration in time range)  albuterol (VENTOLIN HFA) 108 (90 Base) MCG/ACT inhaler 2 puff (has no administration in time range)                                    Medical Decision Making Amount and/or Complexity of Data Reviewed Radiology: ordered.  Risk Prescription drug management.   ADOLPHE FORTUNATO is a 83 year old male history of childhood asthma, hypertension, hyperlipidemia, and stroke without any residual deficits who presents to the emergency department cough and congestion.  Initial Ddx:  URI, pneumonia, bronchitis, asthma, COPD  MDM/Course:  Patient presents emergency department with URI type symptoms.  Congestion has been going on for over a week.  Cough started yesterday.  Mild shortness of breath.  On exam not in acute distress.  Does have some rhonchi on the right side.  Also does have some wheezing.  Chest x-ray without pneumonia.  COVID and flu negative.  I suspect that the  patient may have an occult pneumonia with his abnormal lung findings on the right side.  No significant smoking history to make me suspect he has COPD but does have a history of childhood asthma.  Will give him some steroids in addition to antibiotics as well as an inhaler.  He is overall very well-appearing and is satting well on room air and is not in distress.  Feel that he is suitable for outpatient treatment. Will have him follow-up with his primary doctor in several days.    This patient presents to the ED for concern of complaints listed in HPI, this involves  an extensive number of treatment options, and is a complaint that carries with it a high risk of complications and morbidity. Disposition including potential need for admission considered.   Dispo: DC Home. Return precautions discussed including, but not limited to, those listed in the AVS. Allowed pt time to ask questions which were answered fully prior to dc.  Records reviewed Outpatient Clinic Notes I independently reviewed the following imaging with scope of interpretation limited to determining acute life threatening conditions related to emergency care: Chest x-ray and agree with the radiologist interpretation with the following exceptions: none I have reviewed the patients home medications and made adjustments as needed Social Determinants of health:  Geriatric  Portions of this note were generated with Scientist, clinical (histocompatibility and immunogenetics). Dictation errors may occur despite best attempts at proofreading.     Final diagnoses:  Bronchitis  Community acquired pneumonia of right lower lobe of lung    ED Discharge Orders     None          Yolande Lamar BROCKS, MD 06/10/24 1155

## 2024-06-11 DIAGNOSIS — J40 Bronchitis, not specified as acute or chronic: Secondary | ICD-10-CM | POA: Diagnosis not present

## 2024-06-12 ENCOUNTER — Ambulatory Visit: Admitting: Physician Assistant

## 2024-06-12 ENCOUNTER — Other Ambulatory Visit: Payer: Self-pay

## 2024-06-12 ENCOUNTER — Encounter: Payer: Self-pay | Admitting: Physician Assistant

## 2024-06-12 DIAGNOSIS — Z96652 Presence of left artificial knee joint: Secondary | ICD-10-CM

## 2024-06-12 NOTE — Progress Notes (Signed)
 HPI [name] returns now approximately 8 months status post left total knee arthroplasty.  He states that the left knee is doing well.  He did have a cortisone injection in his right knee which he has known arthritis he said this was very helpful.  He is states he is back to his normal activities.  Has no concerns or complaints.  He states he is walking for exercise.  Review of systems: See HPI otherwise negative  Physical exam: General Well-developed well-nourished male no acute distress ambulates without any assistive device. Left knee full extension and flexion to approximately 110 out of 15 degrees.  No instability valgus varus stressing.  No abnormal warmth erythema or effusion.  Surgical incisions well-healed.  Radiographs: Left knee 2 views: Knee is well located.  No acute fracture or acute findings.  Well-seated total knee arthroplasty.  Impression: Status post left total knee arthroplasty 10/21/2023  Plan: Recommend he continue to work on quad strengthening.  Will see him back at 1 year postop and obtain AP and lateral view of the knee.  Questions were encouraged and answered at length.

## 2024-07-28 ENCOUNTER — Other Ambulatory Visit (INDEPENDENT_AMBULATORY_CARE_PROVIDER_SITE_OTHER): Payer: Self-pay

## 2024-07-28 ENCOUNTER — Ambulatory Visit: Admitting: Sports Medicine

## 2024-07-28 ENCOUNTER — Other Ambulatory Visit: Payer: Self-pay

## 2024-07-28 ENCOUNTER — Encounter: Payer: Self-pay | Admitting: Sports Medicine

## 2024-07-28 DIAGNOSIS — S76312A Strain of muscle, fascia and tendon of the posterior muscle group at thigh level, left thigh, initial encounter: Secondary | ICD-10-CM

## 2024-07-28 DIAGNOSIS — M25552 Pain in left hip: Secondary | ICD-10-CM

## 2024-07-28 DIAGNOSIS — M7918 Myalgia, other site: Secondary | ICD-10-CM

## 2024-07-28 NOTE — Progress Notes (Signed)
 Patient says that on Wednesday he was helping his wife up, and when he pulled with his arms he felt and heard a pop in the left buttock. He says that his pain is about the same today as it was on Wednesday - no better or worse. He has been using a cane to walk because of that pain; he does not notice it as much when seated or resting, but does have more pain and discomfort when on his feet and walking. He denies having any noticeable swelling or bruising in that area, although he does say that he has not looked at it. He has taken Tylenol  only as needed.

## 2024-07-28 NOTE — Progress Notes (Addendum)
 "  Kenneth Harper - 84 y.o. male MRN [id number]  Date of birth: 08/07/1940  Office Visit Note: Visit Date: 07/28/2024 PCP: Ransom Other, MD Referred by: Ransom Other, MD  Subjective: Chief Complaint  Patient presents with   Left Hip - Pain   HPI: Kenneth Harper is a pleasant 84 y.o. male who presents today for acute left posterior hip/buttock pain.  Discussed the use of AI scribe software for clinical note transcription with the patient, who gave verbal consent to proceed.  History of Present Illness Kenneth Harper is an 84 year old male with left total knee arthroplasty who presents with acute left buttock pain following injury.  Pain began suddenly without a fall and is localized to the deep left gluteal/ischial area. It is persistent, tolerable, and nonradiating.  Pain is worsened by sitting on hard surfaces and by knee flexion or attempting to bring the heel to the buttock. He can ambulate, though activity causes discomfort. He notes new pain around the left knee when lowering the leg, which is new since the injury, but the left total knee arthroplasty otherwise functions well.  He has not seen ecchymosis. He has used heated car seats for comfort but has not applied direct ice or heat. He takes acetaminophen  only and has not used NSAIDs. He has no difficulty with basic activities but moves cautiously due to discomfort.  He has known arthritis in both hips without prior symptoms requiring treatment.    Pertinent ROS were reviewed with the patient and found to be negative unless otherwise specified above in HPI.   Assessment & Plan: Visit Diagnoses:  1. Pain in left buttock   2. Rupture of left proximal hamstring tendon, initial encounter    *Relative rest, activity modification now *Begin ESWT, Nitroglycerin patch weeks 2-3 --> referral to PT Assessment & Plan High grade partial tear of left proximal hamstring tendon Acute high grade partial tear just distal to  ischial tuberosity but fibers still intact at insertion, no high-grade avulsion tear. Better likelihood of healing without surgery. Conservative management expected to result in gradual improvement. - Advised activity modification for 7-10 days, minimize ambulation, flexion, lifting, and aggravating activity - Instructed ice application 15-20 minutes daily for 2-3 days, then heat as needed. - Advised acetaminophen  for analgesia, avoid NSAIDs. - Donut pad for seating/comfort - Scheduled follow-up in 10 days for reassessment, plan to initiate shockwave therapy trial. - Planned referral to physical therapy for early stretching, basic strengthening post-healing at follow-up visit --> early rehab with gentle ROM, isometrics, core/pelvic stability b/w weeks 2-6 - goal for full return between 8-12 weeks - Discussed possible future nitroglycerin patch use for enhanced healing, pathophysiology and protocol discussed today - plan to initiate at follow-up b/w 2-3 weeks  *Additional considerations: Further advanced imaging with MRI  Follow-up: Return in about 10 days (around 08/07/2024) for Schedule 2 appts in about 10 days about 1-week apart.   Meds & Orders: No orders of the defined types were placed in this encounter.   Orders Placed This Encounter  Procedures   XR HIP UNILAT W OR W/O PELVIS 2-3 VIEWS LEFT   US  Extrem Low Left Ltd     Procedures: No procedures performed      Clinical History: No specialty comments available.  He reports that he quit smoking about 13 years ago. His smoking use included cigars. He has never used smokeless tobacco. No results for input(s): HGBA1C, LABURIC in the last 8760 hours.  Objective:  Physical Exam  Gen: Well-appearing, in no acute distress; non-toxic CV: Well-perfused. Warm.  Resp: Breathing unlabored on room air; no wheezing. Psych: Fluid speech in conversation; appropriate affect; normal thought process  *MSK/Ortho Exam: Physical  Exam MUSCULOSKELETAL: Tenderness and mild swelling in the left buttock area.   *Hips/Pelvis: + Mild TTP over the ischial tuberosity but more significantly just distal to this of the proximal hamstring origin.  There is mild soft tissue swelling here but no ecchymosis noted.  Patient is able to perform prone knee flexion albeit with some pain.  There is active hip flexion and extension with standing although mild discomfort with extension.  Imaging: US  Extrem Low Left Ltd Result Date: 07/28/2024 Limited musculoskeletal ultrasound of the left posterior leg and hamstring was performed today.  Evaluation of the ischial tuberosity shows a mild cortical irregularity which appears degenerative in nature.  The proximal conjoined tendon does have attachment at the ischial tuberosity.  Just distal to this about 2-4 cm down the proximal hamstring tendons there appears to be high-grade if not near full-thickness tearing of the hamstring tendons.  There is intact fibers superior and inferiorly to this.  Mild hyperemia noted in this location.  XR HIP UNILAT W OR W/O PELVIS 2-3 VIEWS LEFT Result Date: 07/28/2024 2 view x-ray of the left hip including AP pelvis, left hip lateral film were ordered and reviewed by myself today.  X-rays demonstrate mild/moderate hip arthritic change with scattered calcifications around the periphery bilaterally.  There is some overall cortical degenerative changes over bilateral ischial tuberosities although no specific asymmetry in symptomatic left side.  No acute fracture noted.   Past Medical/Family/Surgical/Social History: Medications & Allergies reviewed per EMR, new medications updated. Patient Active Problem List   Diagnosis Date Noted   Status post total left knee replacement 10/21/2023   Unilateral primary osteoarthritis, left knee 10/20/2023   Essential hypertension 08/13/2020   Transaminitis    Steroid-induced hyperglycemia    Prediabetes    Contusion of right knee     Thalamic hemorrhage with stroke (HCC) 08/06/2020   Hemorrhagic stroke (HCC)    Memory loss    Dyslipidemia    Elevated blood pressure reading    AKI (acute kidney injury)    Primary osteoarthritis of right knee    ICH (intracerebral hemorrhage) (HCC) 07/31/2020   Bilateral primary osteoarthritis of knee 08/10/2019   Carpal tunnel syndrome on both sides 07/28/2018   Lumbar herniated disc 10/01/2015   Herniation of lumbar intervertebral disc with radiculopathy 09/30/2015    Class: Acute   Past Medical History:  Diagnosis Date   Arthritis    knees and back   Asthma    childhood   Carpal tunnel syndrome    Cataract    Diverticulitis    GERD (gastroesophageal reflux disease)    Hypercholesterolemia    Memory loss    Stroke (HCC) 07/31/2020   Family History  Problem Relation Age of Onset   Parkinson's disease Mother    Heart failure Father    Diabetes Brother    Cancer Brother        throat   Hydrocephalus Brother    Diabetes Brother    Migraines Sister    Past Surgical History:  Procedure Laterality Date   BILATERAL CARPAL TUNNEL RELEASE     CATARACT EXTRACTION, BILATERAL Bilateral    COLONOSCOPY WITH PROPOFOL  N/A 10/29/2014   Procedure: COLONOSCOPY WITH PROPOFOL ;  Surgeon: Gladis MARLA Louder, MD;  Location: [facility];  Service: Endoscopy;  Laterality:  N/A;   EYE SURGERY     LUMBAR LAMINECTOMY/DECOMPRESSION MICRODISCECTOMY N/A 10/01/2015   Procedure: LEFT L2-3 MICRODISCECTOMY;  Surgeon: Lynwood FORBES Better, MD;  Location: [facility] OR;  Service: Orthopedics;  Laterality: N/A;   TOTAL KNEE ARTHROPLASTY Left 10/21/2023   Procedure: ARTHROPLASTY, KNEE, TOTAL;  Surgeon: Vernetta Lonni GRADE, MD;  Location: [facility];  Service: Orthopedics;  Laterality: Left;   Social History   Occupational History   Not on file  Tobacco Use   Smoking status: Former    Types: Cigars    Quit date: 10/22/2010    Years since quitting: 13.7   Smokeless tobacco: Never  Vaping Use   Vaping status:  Never Used  Substance and Sexual Activity   Alcohol use: No    Alcohol/week: 0.0 standard drinks of alcohol   Drug use: No   Sexual activity: Never    Birth control/protection: Abstinence   I spent 32 minutes in the care of the patient today including face-to-face time, preparation to see the patient, as well as review of x-ray imaging, ultrasound imaging, discussion regarding nature of his high-grade partial tearing and demonstration with models and pictures, discussion on overall return to activity and healing process including adjuvant treatments such as shockwave therapy, nitroglycerin patch protocol, PT for the above diagnoses.   Lonell Sprang, DO Primary Care Sports Medicine Physician  [facility] - Orthoped[facility]  This note was dictated using Dragon naturally speaking software and may contain errors in syntax, spelling, or content which have not been identified prior to signing this note.   "

## 2024-08-10 ENCOUNTER — Ambulatory Visit: Admitting: Sports Medicine

## 2024-08-17 ENCOUNTER — Ambulatory Visit: Admitting: Sports Medicine

## 2024-08-28 ENCOUNTER — Ambulatory Visit: Admitting: Sports Medicine

## 2024-10-12 ENCOUNTER — Ambulatory Visit: Admitting: Physician Assistant
